# Patient Record
Sex: Female | Born: 1945
Health system: Southern US, Community
[De-identification: ages and names within clinical notes are randomized; demographics above are authoritative.]

## PROBLEM LIST (undated history)

## (undated) DIAGNOSIS — I10 Essential (primary) hypertension: Secondary | ICD-10-CM

## (undated) DIAGNOSIS — E669 Obesity, unspecified: Secondary | ICD-10-CM

## (undated) DIAGNOSIS — F32A Depression, unspecified: Secondary | ICD-10-CM

## (undated) DIAGNOSIS — H353190 Nonexudative age-related macular degeneration, unspecified eye, stage unspecified: Secondary | ICD-10-CM

## (undated) DIAGNOSIS — R296 Repeated falls: Secondary | ICD-10-CM

## (undated) DIAGNOSIS — F329 Major depressive disorder, single episode, unspecified: Secondary | ICD-10-CM

## (undated) DIAGNOSIS — F419 Anxiety disorder, unspecified: Secondary | ICD-10-CM

## (undated) DIAGNOSIS — C439 Malignant melanoma of skin, unspecified: Secondary | ICD-10-CM

## (undated) DIAGNOSIS — M1990 Unspecified osteoarthritis, unspecified site: Secondary | ICD-10-CM

## (undated) DIAGNOSIS — C801 Malignant (primary) neoplasm, unspecified: Secondary | ICD-10-CM

## (undated) DIAGNOSIS — N289 Disorder of kidney and ureter, unspecified: Secondary | ICD-10-CM

## (undated) DIAGNOSIS — G252 Other specified forms of tremor: Secondary | ICD-10-CM

## (undated) DIAGNOSIS — G20A1 Parkinson's disease without dyskinesia, without mention of fluctuations: Secondary | ICD-10-CM

## (undated) DIAGNOSIS — G20 Parkinson's disease: Secondary | ICD-10-CM

## (undated) DIAGNOSIS — R259 Unspecified abnormal involuntary movements: Secondary | ICD-10-CM

## (undated) HISTORY — DX: Unspecified abnormal involuntary movements: R25.9

## (undated) HISTORY — DX: Repeated falls: R29.6

## (undated) HISTORY — DX: Obesity, unspecified: E66.9

## (undated) HISTORY — DX: Anxiety disorder, unspecified: F41.9

## (undated) HISTORY — PX: ABDOMINAL HYSTERECTOMY: SUR658

## (undated) HISTORY — DX: Major depressive disorder, single episode, unspecified: F32.9

## (undated) HISTORY — DX: Malignant melanoma of skin, unspecified: C43.9

## (undated) HISTORY — DX: Nonexudative age-related macular degeneration, unspecified eye, stage unspecified: H35.3190

## (undated) HISTORY — DX: Disorder of kidney and ureter, unspecified: N28.9

## (undated) HISTORY — DX: Unspecified osteoarthritis, unspecified site: M19.90

## (undated) HISTORY — PX: CHOLECYSTECTOMY: SHX55

## (undated) HISTORY — DX: Malignant (primary) neoplasm, unspecified: C80.1

## (undated) HISTORY — DX: Depression, unspecified: F32.A

## (undated) HISTORY — DX: Essential (primary) hypertension: I10

## (undated) HISTORY — DX: Other specified forms of tremor: G25.2

---

## 2014-03-24 DIAGNOSIS — R413 Other amnesia: Secondary | ICD-10-CM | POA: Insufficient documentation

## 2016-10-18 ENCOUNTER — Ambulatory Visit (INDEPENDENT_AMBULATORY_CARE_PROVIDER_SITE_OTHER): Payer: Medicare PPO

## 2016-10-18 ENCOUNTER — Encounter (INDEPENDENT_AMBULATORY_CARE_PROVIDER_SITE_OTHER): Payer: Self-pay | Admitting: Orthopaedic Surgery

## 2016-10-18 ENCOUNTER — Ambulatory Visit (INDEPENDENT_AMBULATORY_CARE_PROVIDER_SITE_OTHER): Payer: Medicare PPO | Admitting: Orthopaedic Surgery

## 2016-10-18 VITALS — BP 104/65 | HR 72 | Ht 62.0 in | Wt 190.0 lb

## 2016-10-18 DIAGNOSIS — M79631 Pain in right forearm: Secondary | ICD-10-CM

## 2016-10-18 DIAGNOSIS — S52124A Nondisplaced fracture of head of right radius, initial encounter for closed fracture: Secondary | ICD-10-CM | POA: Insufficient documentation

## 2016-10-18 NOTE — Progress Notes (Signed)
Office Visit Note   Patient: Tonya Perez           Date of Birth: 01-Jul-1946           MRN: [id number] Visit Date: 10/18/2016              Requested by: No referring provider defined for this encounter. PCP: No primary care provider on file.   Assessment & Plan: Visit Diagnoses:  1. Right forearm pain   2. Closed nondisplaced fracture of head of right radius, initial encounter     Plan: We'll place a removable posterior splint and sling. She can remove for bathing work on gentle range of motion the reapplication. We'll recheck her in 3-4 weeks repeat x-rays of the radial head AP lateral elbow and radial head view on return. Norco prescribed for pain. She lived in [place] and this now moved to [place].  Follow-Up Instructions: Use sling you can remove it for bathing. Follow-up for x-rays in 3-4 weeks  Orders:  Orders Placed This Encounter  Procedures  . XR Forearm Right   No orders of the defined types were placed in this encounter.     Procedures: No procedures performed   Clinical Data: No additional findings.   Subjective: Chief Complaint  Patient presents with  . Right Wrist - Pain    Patient fell down four concrete steps last night (10/17/16).  She is having pain in her right wrist but also states that she has pain when rotating arm.  She has used ice and tylenol.  Patient also did her face has bilateral periorbital ecchymosis. No pain in her face normal smiling grimace. She denies cognitive changes no loss of consciousness. She is here with family members who verifies that the story of her injury and no loss of consciousness.  Review of Systems  Constitutional: Negative for chills and diaphoresis.  HENT: Negative for ear discharge, ear pain and nosebleeds.   Eyes: Negative for discharge and visual disturbance.  Respiratory: Negative for cough, choking and shortness of breath.   Cardiovascular: Negative for chest pain and palpitations.   Positive for hypertension.  Gastrointestinal: Negative for abdominal distention and abdominal pain.       Previous gallbladder surgery  Endocrine: Negative for cold intolerance and heat intolerance.  Genitourinary: Negative for flank pain and hematuria.       There is hysterectomy history of cancer. Previous history verbally of kidney disease.  Skin: Negative for rash and wound.  Neurological: Negative for seizures and speech difficulty.  Hematological: Negative for adenopathy. Does not bruise/bleed easily.  Psychiatric/Behavioral: Negative for agitation and suicidal ideas.       Positive for anxiety and depression.     Objective: Vital Signs: BP 104/65   Pulse 72   Ht 5\' 2"  (1.575 m)   Wt 190 lb (86.2 kg)   BMI 34.75 kg/m   Physical Exam  Constitutional: She is oriented to person, place, and time. She appears well-developed.  HENT:  Head: Normocephalic.  Right Ear: External ear normal.  Left Ear: External ear normal.  Eyes: Pupils are equal, round, and reactive to light.  Good visual acuity both eyes.. Orbital mild ecchymosis. Abrasion over her forehead. No palpable step off orbital rim is nontender no nasal deformity. Normal grimace and smile.  Neck: No tracheal deviation present. No thyromegaly present.  Cardiovascular: Normal rate.   Pulmonary/Chest: Effort normal.  Abdominal: Soft.  Musculoskeletal:  She has pain with forearm rotation at the radial head acute  tenderness with palpation of the radial head. Trace elbow effusion noted. The range of motion is 20-90 with pain at the end of the art. No distal radius tenderness sensation and is normal.  Neurological: She is alert and oriented to person, place, and time. No cranial nerve deficit. Coordination normal.  Skin: Skin is warm and dry.  Psychiatric: She has a normal mood and affect. Her behavior is normal.    Ortho Exam  Specialty Comments:  No specialty comments available.  Imaging: No results found.   PMFS  History: Patient Active Problem List   Diagnosis Date Noted  . Closed nondisplaced fracture of head of right radius 10/18/2016   Past Medical History:  Diagnosis Date  . Anxiety   . Cancer ([place])   . Depression   . High blood pressure   . Kidney disease     No family history on file.  Past Surgical History:  Procedure Laterality Date  . ABDOMINAL HYSTERECTOMY    . CHOLECYSTECTOMY     Social History   Occupational History  . Not on file.   Social History Main Topics  . Smoking status: Never Smoker  . Smokeless tobacco: Never Used  . Alcohol use No  . Drug use: No  . Sexual activity: Not on file

## 2016-11-20 DIAGNOSIS — F411 Generalized anxiety disorder: Secondary | ICD-10-CM | POA: Diagnosis not present

## 2016-11-20 DIAGNOSIS — F341 Dysthymic disorder: Secondary | ICD-10-CM | POA: Diagnosis not present

## 2017-03-14 DIAGNOSIS — N183 Chronic kidney disease, stage 3 (moderate): Secondary | ICD-10-CM | POA: Diagnosis not present

## 2017-03-14 DIAGNOSIS — N189 Chronic kidney disease, unspecified: Secondary | ICD-10-CM | POA: Diagnosis not present

## 2017-03-14 DIAGNOSIS — I10 Essential (primary) hypertension: Secondary | ICD-10-CM | POA: Diagnosis not present

## 2017-04-03 DIAGNOSIS — M25562 Pain in left knee: Secondary | ICD-10-CM | POA: Diagnosis not present

## 2017-04-03 DIAGNOSIS — M170 Bilateral primary osteoarthritis of knee: Secondary | ICD-10-CM | POA: Diagnosis not present

## 2017-04-08 DIAGNOSIS — D374 Neoplasm of uncertain behavior of colon: Secondary | ICD-10-CM | POA: Diagnosis not present

## 2017-04-08 DIAGNOSIS — D125 Benign neoplasm of sigmoid colon: Secondary | ICD-10-CM | POA: Diagnosis not present

## 2017-04-08 DIAGNOSIS — K648 Other hemorrhoids: Secondary | ICD-10-CM | POA: Diagnosis not present

## 2017-04-08 DIAGNOSIS — Z1211 Encounter for screening for malignant neoplasm of colon: Secondary | ICD-10-CM | POA: Diagnosis not present

## 2017-04-08 DIAGNOSIS — K649 Unspecified hemorrhoids: Secondary | ICD-10-CM | POA: Diagnosis not present

## 2017-04-08 DIAGNOSIS — K635 Polyp of colon: Secondary | ICD-10-CM | POA: Diagnosis not present

## 2017-04-08 DIAGNOSIS — K5730 Diverticulosis of large intestine without perforation or abscess without bleeding: Secondary | ICD-10-CM | POA: Diagnosis not present

## 2017-04-10 DIAGNOSIS — H353133 Nonexudative age-related macular degeneration, bilateral, advanced atrophic without subfoveal involvement: Secondary | ICD-10-CM | POA: Diagnosis not present

## 2017-04-15 DIAGNOSIS — K589 Irritable bowel syndrome without diarrhea: Secondary | ICD-10-CM | POA: Diagnosis not present

## 2017-04-15 DIAGNOSIS — N182 Chronic kidney disease, stage 2 (mild): Secondary | ICD-10-CM | POA: Diagnosis not present

## 2017-04-15 DIAGNOSIS — I10 Essential (primary) hypertension: Secondary | ICD-10-CM | POA: Diagnosis not present

## 2017-04-19 DIAGNOSIS — K635 Polyp of colon: Secondary | ICD-10-CM | POA: Diagnosis not present

## 2017-05-22 DIAGNOSIS — I129 Hypertensive chronic kidney disease with stage 1 through stage 4 chronic kidney disease, or unspecified chronic kidney disease: Secondary | ICD-10-CM | POA: Diagnosis not present

## 2017-05-22 DIAGNOSIS — N182 Chronic kidney disease, stage 2 (mild): Secondary | ICD-10-CM | POA: Diagnosis not present

## 2017-05-23 DIAGNOSIS — M25562 Pain in left knee: Secondary | ICD-10-CM | POA: Diagnosis not present

## 2017-05-23 DIAGNOSIS — M170 Bilateral primary osteoarthritis of knee: Secondary | ICD-10-CM | POA: Diagnosis not present

## 2017-06-19 DIAGNOSIS — F411 Generalized anxiety disorder: Secondary | ICD-10-CM | POA: Diagnosis not present

## 2017-06-19 DIAGNOSIS — F341 Dysthymic disorder: Secondary | ICD-10-CM | POA: Diagnosis not present

## 2017-10-07 DIAGNOSIS — Z23 Encounter for immunization: Secondary | ICD-10-CM | POA: Diagnosis not present

## 2017-11-15 DIAGNOSIS — K589 Irritable bowel syndrome without diarrhea: Secondary | ICD-10-CM | POA: Diagnosis not present

## 2017-11-15 DIAGNOSIS — I10 Essential (primary) hypertension: Secondary | ICD-10-CM | POA: Diagnosis not present

## 2017-11-15 DIAGNOSIS — E7800 Pure hypercholesterolemia, unspecified: Secondary | ICD-10-CM | POA: Diagnosis not present

## 2017-12-06 DIAGNOSIS — M25561 Pain in right knee: Secondary | ICD-10-CM | POA: Diagnosis not present

## 2017-12-06 DIAGNOSIS — M170 Bilateral primary osteoarthritis of knee: Secondary | ICD-10-CM | POA: Diagnosis not present

## 2017-12-06 DIAGNOSIS — M25562 Pain in left knee: Secondary | ICD-10-CM | POA: Diagnosis not present

## 2017-12-11 DIAGNOSIS — F341 Dysthymic disorder: Secondary | ICD-10-CM | POA: Diagnosis not present

## 2017-12-11 DIAGNOSIS — F411 Generalized anxiety disorder: Secondary | ICD-10-CM | POA: Diagnosis not present

## 2017-12-12 DIAGNOSIS — H3540 Unspecified peripheral retinal degeneration: Secondary | ICD-10-CM | POA: Diagnosis not present

## 2017-12-12 DIAGNOSIS — H353132 Nonexudative age-related macular degeneration, bilateral, intermediate dry stage: Secondary | ICD-10-CM | POA: Diagnosis not present

## 2017-12-16 DIAGNOSIS — I10 Essential (primary) hypertension: Secondary | ICD-10-CM | POA: Diagnosis not present

## 2017-12-16 DIAGNOSIS — Z1389 Encounter for screening for other disorder: Secondary | ICD-10-CM | POA: Diagnosis not present

## 2017-12-16 DIAGNOSIS — Z5320 Procedure and treatment not carried out because of patient's decision for unspecified reasons: Secondary | ICD-10-CM | POA: Diagnosis not present

## 2017-12-16 DIAGNOSIS — Z0000 Encounter for general adult medical examination without abnormal findings: Secondary | ICD-10-CM | POA: Diagnosis not present

## 2017-12-16 DIAGNOSIS — N183 Chronic kidney disease, stage 3 (moderate): Secondary | ICD-10-CM | POA: Diagnosis not present

## 2018-01-09 DIAGNOSIS — H353132 Nonexudative age-related macular degeneration, bilateral, intermediate dry stage: Secondary | ICD-10-CM | POA: Diagnosis not present

## 2018-01-09 DIAGNOSIS — H2513 Age-related nuclear cataract, bilateral: Secondary | ICD-10-CM | POA: Diagnosis not present

## 2018-01-23 DIAGNOSIS — M170 Bilateral primary osteoarthritis of knee: Secondary | ICD-10-CM | POA: Diagnosis not present

## 2018-01-27 DIAGNOSIS — H353132 Nonexudative age-related macular degeneration, bilateral, intermediate dry stage: Secondary | ICD-10-CM | POA: Diagnosis not present

## 2018-01-27 DIAGNOSIS — H43813 Vitreous degeneration, bilateral: Secondary | ICD-10-CM | POA: Diagnosis not present

## 2018-01-29 DIAGNOSIS — M170 Bilateral primary osteoarthritis of knee: Secondary | ICD-10-CM | POA: Diagnosis not present

## 2018-01-31 DIAGNOSIS — R8279 Other abnormal findings on microbiological examination of urine: Secondary | ICD-10-CM | POA: Diagnosis not present

## 2018-01-31 DIAGNOSIS — N3946 Mixed incontinence: Secondary | ICD-10-CM | POA: Diagnosis not present

## 2018-02-05 DIAGNOSIS — M170 Bilateral primary osteoarthritis of knee: Secondary | ICD-10-CM | POA: Diagnosis not present

## 2018-02-11 DIAGNOSIS — R0989 Other specified symptoms and signs involving the circulatory and respiratory systems: Secondary | ICD-10-CM | POA: Diagnosis not present

## 2018-02-11 DIAGNOSIS — N390 Urinary tract infection, site not specified: Secondary | ICD-10-CM | POA: Diagnosis not present

## 2018-02-11 DIAGNOSIS — J209 Acute bronchitis, unspecified: Secondary | ICD-10-CM | POA: Diagnosis not present

## 2018-02-11 DIAGNOSIS — J00 Acute nasopharyngitis [common cold]: Secondary | ICD-10-CM | POA: Diagnosis not present

## 2018-02-11 DIAGNOSIS — N393 Stress incontinence (female) (male): Secondary | ICD-10-CM | POA: Diagnosis not present

## 2018-02-11 DIAGNOSIS — J40 Bronchitis, not specified as acute or chronic: Secondary | ICD-10-CM | POA: Diagnosis not present

## 2018-02-11 DIAGNOSIS — I10 Essential (primary) hypertension: Secondary | ICD-10-CM | POA: Diagnosis not present

## 2018-02-11 DIAGNOSIS — R05 Cough: Secondary | ICD-10-CM | POA: Diagnosis not present

## 2018-03-19 DIAGNOSIS — M25561 Pain in right knee: Secondary | ICD-10-CM | POA: Diagnosis not present

## 2018-03-19 DIAGNOSIS — M170 Bilateral primary osteoarthritis of knee: Secondary | ICD-10-CM | POA: Diagnosis not present

## 2018-03-19 DIAGNOSIS — M25562 Pain in left knee: Secondary | ICD-10-CM | POA: Diagnosis not present

## 2018-03-26 DIAGNOSIS — M25561 Pain in right knee: Secondary | ICD-10-CM | POA: Insufficient documentation

## 2018-03-26 DIAGNOSIS — M6281 Muscle weakness (generalized): Secondary | ICD-10-CM | POA: Diagnosis not present

## 2018-03-26 DIAGNOSIS — M25562 Pain in left knee: Secondary | ICD-10-CM | POA: Diagnosis not present

## 2018-03-26 DIAGNOSIS — R2689 Other abnormalities of gait and mobility: Secondary | ICD-10-CM | POA: Insufficient documentation

## 2018-03-26 DIAGNOSIS — R262 Difficulty in walking, not elsewhere classified: Secondary | ICD-10-CM | POA: Diagnosis not present

## 2018-03-27 DIAGNOSIS — M25562 Pain in left knee: Secondary | ICD-10-CM | POA: Diagnosis not present

## 2018-03-27 DIAGNOSIS — R2689 Other abnormalities of gait and mobility: Secondary | ICD-10-CM | POA: Diagnosis not present

## 2018-03-27 DIAGNOSIS — R262 Difficulty in walking, not elsewhere classified: Secondary | ICD-10-CM | POA: Diagnosis not present

## 2018-03-27 DIAGNOSIS — M6281 Muscle weakness (generalized): Secondary | ICD-10-CM | POA: Diagnosis not present

## 2018-03-27 DIAGNOSIS — M25561 Pain in right knee: Secondary | ICD-10-CM | POA: Diagnosis not present

## 2018-04-02 DIAGNOSIS — M25562 Pain in left knee: Secondary | ICD-10-CM | POA: Diagnosis not present

## 2018-04-02 DIAGNOSIS — R262 Difficulty in walking, not elsewhere classified: Secondary | ICD-10-CM | POA: Diagnosis not present

## 2018-04-02 DIAGNOSIS — R2689 Other abnormalities of gait and mobility: Secondary | ICD-10-CM | POA: Diagnosis not present

## 2018-04-02 DIAGNOSIS — M6281 Muscle weakness (generalized): Secondary | ICD-10-CM | POA: Diagnosis not present

## 2018-04-02 DIAGNOSIS — M25561 Pain in right knee: Secondary | ICD-10-CM | POA: Diagnosis not present

## 2018-04-03 DIAGNOSIS — N3946 Mixed incontinence: Secondary | ICD-10-CM | POA: Diagnosis not present

## 2018-04-03 DIAGNOSIS — R32 Unspecified urinary incontinence: Secondary | ICD-10-CM | POA: Diagnosis not present

## 2018-04-04 DIAGNOSIS — R262 Difficulty in walking, not elsewhere classified: Secondary | ICD-10-CM | POA: Diagnosis not present

## 2018-04-04 DIAGNOSIS — M6281 Muscle weakness (generalized): Secondary | ICD-10-CM | POA: Diagnosis not present

## 2018-04-04 DIAGNOSIS — M25562 Pain in left knee: Secondary | ICD-10-CM | POA: Diagnosis not present

## 2018-04-04 DIAGNOSIS — R2689 Other abnormalities of gait and mobility: Secondary | ICD-10-CM | POA: Diagnosis not present

## 2018-04-04 DIAGNOSIS — M25561 Pain in right knee: Secondary | ICD-10-CM | POA: Diagnosis not present

## 2018-04-09 DIAGNOSIS — M25562 Pain in left knee: Secondary | ICD-10-CM | POA: Diagnosis not present

## 2018-04-09 DIAGNOSIS — R262 Difficulty in walking, not elsewhere classified: Secondary | ICD-10-CM | POA: Diagnosis not present

## 2018-04-09 DIAGNOSIS — R2689 Other abnormalities of gait and mobility: Secondary | ICD-10-CM | POA: Diagnosis not present

## 2018-04-09 DIAGNOSIS — M6281 Muscle weakness (generalized): Secondary | ICD-10-CM | POA: Diagnosis not present

## 2018-04-09 DIAGNOSIS — M25561 Pain in right knee: Secondary | ICD-10-CM | POA: Diagnosis not present

## 2018-04-16 DIAGNOSIS — R262 Difficulty in walking, not elsewhere classified: Secondary | ICD-10-CM | POA: Diagnosis not present

## 2018-04-16 DIAGNOSIS — M25561 Pain in right knee: Secondary | ICD-10-CM | POA: Diagnosis not present

## 2018-04-16 DIAGNOSIS — M25562 Pain in left knee: Secondary | ICD-10-CM | POA: Diagnosis not present

## 2018-04-16 DIAGNOSIS — M6281 Muscle weakness (generalized): Secondary | ICD-10-CM | POA: Diagnosis not present

## 2018-04-16 DIAGNOSIS — R2689 Other abnormalities of gait and mobility: Secondary | ICD-10-CM | POA: Diagnosis not present

## 2018-05-07 DIAGNOSIS — M6281 Muscle weakness (generalized): Secondary | ICD-10-CM | POA: Diagnosis not present

## 2018-05-07 DIAGNOSIS — M25561 Pain in right knee: Secondary | ICD-10-CM | POA: Diagnosis not present

## 2018-05-07 DIAGNOSIS — R262 Difficulty in walking, not elsewhere classified: Secondary | ICD-10-CM | POA: Diagnosis not present

## 2018-05-07 DIAGNOSIS — R2689 Other abnormalities of gait and mobility: Secondary | ICD-10-CM | POA: Diagnosis not present

## 2018-05-07 DIAGNOSIS — M25562 Pain in left knee: Secondary | ICD-10-CM | POA: Diagnosis not present

## 2018-05-21 DIAGNOSIS — R2689 Other abnormalities of gait and mobility: Secondary | ICD-10-CM | POA: Diagnosis not present

## 2018-05-21 DIAGNOSIS — M25561 Pain in right knee: Secondary | ICD-10-CM | POA: Diagnosis not present

## 2018-05-21 DIAGNOSIS — M6281 Muscle weakness (generalized): Secondary | ICD-10-CM | POA: Diagnosis not present

## 2018-05-21 DIAGNOSIS — M25562 Pain in left knee: Secondary | ICD-10-CM | POA: Diagnosis not present

## 2018-05-21 DIAGNOSIS — R262 Difficulty in walking, not elsewhere classified: Secondary | ICD-10-CM | POA: Diagnosis not present

## 2018-05-29 DIAGNOSIS — M2392 Unspecified internal derangement of left knee: Secondary | ICD-10-CM | POA: Diagnosis not present

## 2018-06-11 DIAGNOSIS — F419 Anxiety disorder, unspecified: Secondary | ICD-10-CM | POA: Diagnosis not present

## 2018-06-11 DIAGNOSIS — I10 Essential (primary) hypertension: Secondary | ICD-10-CM | POA: Diagnosis not present

## 2018-06-11 DIAGNOSIS — N390 Urinary tract infection, site not specified: Secondary | ICD-10-CM | POA: Diagnosis not present

## 2018-06-11 DIAGNOSIS — Z888 Allergy status to other drugs, medicaments and biological substances status: Secondary | ICD-10-CM | POA: Diagnosis not present

## 2018-06-11 DIAGNOSIS — Z79899 Other long term (current) drug therapy: Secondary | ICD-10-CM | POA: Diagnosis not present

## 2018-06-11 DIAGNOSIS — S0990XA Unspecified injury of head, initial encounter: Secondary | ICD-10-CM | POA: Diagnosis not present

## 2018-06-11 DIAGNOSIS — Z882 Allergy status to sulfonamides status: Secondary | ICD-10-CM | POA: Diagnosis not present

## 2018-06-11 DIAGNOSIS — R930 Abnormal findings on diagnostic imaging of skull and head, not elsewhere classified: Secondary | ICD-10-CM | POA: Diagnosis not present

## 2018-06-11 DIAGNOSIS — F411 Generalized anxiety disorder: Secondary | ICD-10-CM | POA: Diagnosis not present

## 2018-06-11 DIAGNOSIS — F341 Dysthymic disorder: Secondary | ICD-10-CM | POA: Diagnosis not present

## 2018-06-11 DIAGNOSIS — S060X1A Concussion with loss of consciousness of 30 minutes or less, initial encounter: Secondary | ICD-10-CM | POA: Diagnosis not present

## 2018-06-12 DIAGNOSIS — S83242A Other tear of medial meniscus, current injury, left knee, initial encounter: Secondary | ICD-10-CM | POA: Diagnosis not present

## 2018-06-16 DIAGNOSIS — R262 Difficulty in walking, not elsewhere classified: Secondary | ICD-10-CM | POA: Diagnosis not present

## 2018-06-16 DIAGNOSIS — M170 Bilateral primary osteoarthritis of knee: Secondary | ICD-10-CM | POA: Diagnosis not present

## 2018-06-16 DIAGNOSIS — M2392 Unspecified internal derangement of left knee: Secondary | ICD-10-CM | POA: Diagnosis not present

## 2018-07-06 DIAGNOSIS — N189 Chronic kidney disease, unspecified: Secondary | ICD-10-CM | POA: Diagnosis not present

## 2018-07-06 DIAGNOSIS — I129 Hypertensive chronic kidney disease with stage 1 through stage 4 chronic kidney disease, or unspecified chronic kidney disease: Secondary | ICD-10-CM | POA: Diagnosis not present

## 2018-07-10 DIAGNOSIS — H2513 Age-related nuclear cataract, bilateral: Secondary | ICD-10-CM | POA: Diagnosis not present

## 2018-07-10 DIAGNOSIS — H353132 Nonexudative age-related macular degeneration, bilateral, intermediate dry stage: Secondary | ICD-10-CM | POA: Diagnosis not present

## 2018-07-14 DIAGNOSIS — I129 Hypertensive chronic kidney disease with stage 1 through stage 4 chronic kidney disease, or unspecified chronic kidney disease: Secondary | ICD-10-CM | POA: Diagnosis not present

## 2018-07-14 DIAGNOSIS — N182 Chronic kidney disease, stage 2 (mild): Secondary | ICD-10-CM | POA: Diagnosis not present

## 2018-07-14 DIAGNOSIS — E669 Obesity, unspecified: Secondary | ICD-10-CM | POA: Diagnosis not present

## 2018-07-24 DIAGNOSIS — Z23 Encounter for immunization: Secondary | ICD-10-CM | POA: Diagnosis not present

## 2018-09-05 DIAGNOSIS — M170 Bilateral primary osteoarthritis of knee: Secondary | ICD-10-CM | POA: Diagnosis not present

## 2018-09-10 DIAGNOSIS — F411 Generalized anxiety disorder: Secondary | ICD-10-CM | POA: Diagnosis not present

## 2018-09-10 DIAGNOSIS — F341 Dysthymic disorder: Secondary | ICD-10-CM | POA: Diagnosis not present

## 2018-12-23 DIAGNOSIS — E6609 Other obesity due to excess calories: Secondary | ICD-10-CM | POA: Diagnosis not present

## 2018-12-23 DIAGNOSIS — I10 Essential (primary) hypertension: Secondary | ICD-10-CM | POA: Diagnosis not present

## 2018-12-23 DIAGNOSIS — R251 Tremor, unspecified: Secondary | ICD-10-CM | POA: Diagnosis not present

## 2018-12-23 DIAGNOSIS — R296 Repeated falls: Secondary | ICD-10-CM | POA: Diagnosis not present

## 2018-12-23 DIAGNOSIS — F332 Major depressive disorder, recurrent severe without psychotic features: Secondary | ICD-10-CM | POA: Diagnosis not present

## 2018-12-23 DIAGNOSIS — H3530 Unspecified macular degeneration: Secondary | ICD-10-CM | POA: Diagnosis not present

## 2018-12-23 DIAGNOSIS — M170 Bilateral primary osteoarthritis of knee: Secondary | ICD-10-CM | POA: Diagnosis not present

## 2019-02-10 DIAGNOSIS — I10 Essential (primary) hypertension: Secondary | ICD-10-CM | POA: Diagnosis not present

## 2019-02-13 DIAGNOSIS — E6609 Other obesity due to excess calories: Secondary | ICD-10-CM | POA: Diagnosis not present

## 2019-02-13 DIAGNOSIS — I129 Hypertensive chronic kidney disease with stage 1 through stage 4 chronic kidney disease, or unspecified chronic kidney disease: Secondary | ICD-10-CM | POA: Diagnosis not present

## 2019-02-13 DIAGNOSIS — Z0001 Encounter for general adult medical examination with abnormal findings: Secondary | ICD-10-CM | POA: Diagnosis not present

## 2019-02-13 DIAGNOSIS — M170 Bilateral primary osteoarthritis of knee: Secondary | ICD-10-CM | POA: Diagnosis not present

## 2019-02-13 DIAGNOSIS — H3530 Unspecified macular degeneration: Secondary | ICD-10-CM | POA: Diagnosis not present

## 2019-02-13 DIAGNOSIS — F332 Major depressive disorder, recurrent severe without psychotic features: Secondary | ICD-10-CM | POA: Diagnosis not present

## 2019-02-13 DIAGNOSIS — R296 Repeated falls: Secondary | ICD-10-CM | POA: Diagnosis not present

## 2019-02-13 DIAGNOSIS — N183 Chronic kidney disease, stage 3 (moderate): Secondary | ICD-10-CM | POA: Diagnosis not present

## 2019-02-13 DIAGNOSIS — R251 Tremor, unspecified: Secondary | ICD-10-CM | POA: Diagnosis not present

## 2019-02-16 DIAGNOSIS — M25569 Pain in unspecified knee: Secondary | ICD-10-CM | POA: Diagnosis not present

## 2019-02-16 DIAGNOSIS — M179 Osteoarthritis of knee, unspecified: Secondary | ICD-10-CM | POA: Diagnosis not present

## 2019-02-16 DIAGNOSIS — R42 Dizziness and giddiness: Secondary | ICD-10-CM | POA: Diagnosis not present

## 2019-02-16 DIAGNOSIS — Z9181 History of falling: Secondary | ICD-10-CM | POA: Diagnosis not present

## 2019-02-16 DIAGNOSIS — G3184 Mild cognitive impairment, so stated: Secondary | ICD-10-CM | POA: Diagnosis not present

## 2019-02-16 DIAGNOSIS — H353130 Nonexudative age-related macular degeneration, bilateral, stage unspecified: Secondary | ICD-10-CM | POA: Diagnosis not present

## 2019-02-16 DIAGNOSIS — R251 Tremor, unspecified: Secondary | ICD-10-CM | POA: Diagnosis not present

## 2019-02-18 ENCOUNTER — Encounter: Payer: Self-pay | Admitting: *Deleted

## 2019-02-19 ENCOUNTER — Other Ambulatory Visit: Payer: Self-pay

## 2019-02-19 ENCOUNTER — Telehealth: Payer: Self-pay | Admitting: Neurology

## 2019-02-19 ENCOUNTER — Ambulatory Visit (INDEPENDENT_AMBULATORY_CARE_PROVIDER_SITE_OTHER): Payer: Medicare Other | Admitting: Neurology

## 2019-02-19 ENCOUNTER — Encounter: Payer: Self-pay | Admitting: Neurology

## 2019-02-19 DIAGNOSIS — R251 Tremor, unspecified: Secondary | ICD-10-CM | POA: Diagnosis not present

## 2019-02-19 NOTE — Progress Notes (Addendum)
PATIENT: Tonya Perez DOB: 1945-12-04  Virtual Visit via video  I connected with Tonya Perez on 02/19/19 at  by video and verified that I am speaking with the correct person using two identifiers.   I discussed the limitations, risks, security and privacy concerns of performing an evaluation and management service by video and the availability of in person appointments. I also discussed with the patient that there may be a patient responsible charge related to this service. The patient expressed understanding and agreed to proceed.  HISTORICAL   Follow Up Instructions:   34-month   I discussed the assessment and treatment plan with the patient. The patient was provided an opportunity to ask questions and all were answered. The patient agreed with the plan and demonstrated an understanding of the instructions.   The patient was advised to call back or seek an in-person evaluation if the symptoms worsen or if the condition fails to improve as anticipated.  I provided 30 minutes of non-face-to-face time during this encounter.  REVIEW OF SYSTEMS: Full 14 system review of systems performed and notable only for as above. All other review of systems were negative.  ALLERGIES: Allergies  Allergen Reactions  . Sulfa Antibiotics Swelling and Rash    HOME MEDICATIONS: Current Outpatient Medications  Medication Sig Dispense Refill  . ALPRAZolam (XANAX) 0.5 MG tablet Take 0.5 mg by mouth at bedtime.     Marland Kitchen buPROPion (WELLBUTRIN SR) 200 MG 12 hr tablet Take 400 mg by mouth daily.     . Cholecalciferol (VITAMIN D3 PO) Take 1,000 Units by mouth daily.    Marland Kitchen imipramine (TOFRANIL) 25 MG tablet Take 25 mg by mouth daily.     Marland Kitchen lisinopril (PRINIVIL,ZESTRIL) 20 MG tablet Take 20 mg by mouth daily.      No current facility-administered medications for this visit.     PAST MEDICAL HISTORY: Past Medical History:  Diagnosis Date  . Anxiety   . Depression   . Frequent falls   . High blood pressure    . Kidney disease   . Macular degeneration, dry   . Melanoma ([place])   . Obesity   . Osteoarthritis   . Resting tremor    left hand    PAST SURGICAL HISTORY: Past Surgical History:  Procedure Laterality Date  . ABDOMINAL HYSTERECTOMY    . CHOLECYSTECTOMY      FAMILY HISTORY: Family History  Problem Relation Age of Onset  . Other Mother        ruptured gall bladder  . Heart attack Father     SOCIAL HISTORY:   Social History   Socioeconomic History  . Marital status: Married    Spouse name: Not on file  . Number of children: 2  . Years of education: 54  . Highest education level: High school graduate  Occupational History  . Occupation: Retired  Scientific laboratory technician  . Financial resource strain: Not on file  . Food insecurity:    Worry: Not on file    Inability: Not on file  . Transportation needs:    Medical: Not on file    Non-medical: Not on file  Tobacco Use  . Smoking status: Never Smoker  . Smokeless tobacco: Never Used  Substance and Sexual Activity  . Alcohol use: No  . Drug use: No  . Sexual activity: Not on file  Lifestyle  . Physical activity:    Days per week: Not on file    Minutes per session: Not on file  .  Stress: Not on file  Relationships  . Social connections:    Talks on phone: Not on file    Gets together: Not on file    Attends religious service: Not on file    Active member of club or organization: Not on file    Attends meetings of clubs or organizations: Not on file    Relationship status: Not on file  . Intimate partner violence:    Fear of current or ex partner: Not on file    Emotionally abused: Not on file    Physically abused: Not on file    Forced sexual activity: Not on file  Other Topics Concern  . Not on file  Social History Narrative   Lives at home with her husband.   Left-handed.   No caffeine use.    Marcial Pacas, M.D. Ph.D.  [facility] [address], [place], [place] [address] Ph:  [phone] Fax: [phone]  CC: Referring Provider   PATIENT: Tonya Perez DOB: 08/06/1946  Virtual Visit via video  I connected with Tonya Perez on 02/19/19 at  by video and verified that I am speaking with the correct person using two identifiers.   I discussed the limitations, risks, security and privacy concerns of performing an evaluation and management service by video and the availability of in person appointments. I also discussed with the patient that there may be a patient responsible charge related to this service. The patient expressed understanding and agreed to proceed.  HISTORICAL  Tonya Perez, seen in request by husband daguther in [place]  I have reviewed and summarized the referring note from the referring physician. HTN, dperssion,  wellburtinx xr 200mg  2 qhs, xanax fro sleeps,  Tremor, left elbow to her finger, since 2019, gettign wrose 6 mnths, tremor present  Only the left arm, nto in right arm, trouble walking knee pain  No family of tremor, left handed, when her left hand tremor getting bad,      Lab evaluation, has August 2019, recent lab by her  Observations/Objective: I have reviewed problem lists, medications, allergies.  Assessment and Plan:   Follow Up Instructions:     I discussed the assessment and treatment plan with the patient. The patient was provided an opportunity to ask questions and all were answered. The patient agreed with the plan and demonstrated an understanding of the instructions.   The patient was advised to call back or seek an in-person evaluation if the symptoms worsen or if the condition fails to improve as anticipated.  I provided 30 minutes of non-face-to-face time during this encounter.  REVIEW OF SYSTEMS: Full 14 system review of systems performed and notable only for as above All other review of systems were negative.  ALLERGIES: Allergies  Allergen Reactions  . Sulfa Antibiotics Swelling and Rash     HOME MEDICATIONS: Current Outpatient Medications  Medication Sig Dispense Refill  . ALPRAZolam (XANAX) 0.5 MG tablet Take 0.5 mg by mouth at bedtime.     Marland Kitchen buPROPion (WELLBUTRIN SR) 200 MG 12 hr tablet Take 400 mg by mouth daily.     . Cholecalciferol (VITAMIN D3 PO) Take 1,000 Units by mouth daily.    Marland Kitchen imipramine (TOFRANIL) 25 MG tablet Take 25 mg by mouth daily.     Marland Kitchen lisinopril (PRINIVIL,ZESTRIL) 20 MG tablet Take 20 mg by mouth daily.      No current facility-administered medications for this visit.     PAST MEDICAL HISTORY: Past Medical History:  Diagnosis Date  . Anxiety   . Depression   . Frequent falls   . High blood pressure   . Kidney disease   . Macular degeneration, dry   . Melanoma ([place])   . Obesity   . Osteoarthritis   . Resting tremor    left hand    PAST SURGICAL HISTORY: Past Surgical History:  Procedure Laterality Date  . ABDOMINAL HYSTERECTOMY    . CHOLECYSTECTOMY      FAMILY HISTORY: Family History  Problem Relation Age of Onset  . Other Mother        ruptured gall bladder  . Heart attack Father     SOCIAL HISTORY:   Social History   Socioeconomic History  . Marital status: Married    Spouse name: Not on file  . Number of children: 2  . Years of education: 110  . Highest education level: High school graduate  Occupational History  . Occupation: Retired  Scientific laboratory technician  . Financial resource strain: Not on file  . Food insecurity:    Worry: Not on file    Inability: Not on file  . Transportation needs:    Medical: Not on file    Non-medical: Not on file  Tobacco Use  . Smoking status: Never Smoker  . Smokeless tobacco: Never Used  Substance and Sexual Activity  . Alcohol use: No  . Drug use: No  . Sexual activity: Not on file  Lifestyle  . Physical activity:    Days per week: Not on file    Minutes per session: Not on file  . Stress: Not on file  Relationships  . Social connections:    Talks on phone: Not on file    Gets  together: Not on file    Attends religious service: Not on file    Active member of club or organization: Not on file    Attends meetings of clubs or organizations: Not on file    Relationship status: Not on file  . Intimate partner violence:    Fear of current or ex partner: Not on file    Emotionally abused: Not on file    Physically abused: Not on file    Forced sexual activity: Not on file  Other Topics Concern  . Not on file  Social History Narrative   Lives at home with her husband.   Left-handed.   No caffeine use.    Marcial Pacas, M.D. Ph.D.  [facility] [address] [place], [place] [address] Ph: [phone] Fax: [phone]  CC: Pablo Lawrence, NP

## 2019-02-19 NOTE — Telephone Encounter (Signed)
Medicare/aarp order sent to [facility]. No auth they will reach out to the pt to schedule.  °

## 2019-02-24 NOTE — Telephone Encounter (Signed)
I spoke to the patient.  She has alprazolam 0.5mg  at home and will take her supply, as prescribed, prior to the MRI.  Reminded her that she will require a driver and she verbalized understanding.

## 2019-02-24 NOTE — Telephone Encounter (Signed)
Pt called in and wanted to know if a sedative can be sent over for her MRI   Tonya Perez, [name]. HARRISON S

## 2019-02-26 ENCOUNTER — Other Ambulatory Visit: Payer: Self-pay

## 2019-02-26 ENCOUNTER — Ambulatory Visit
Admission: RE | Admit: 2019-02-26 | Discharge: 2019-02-26 | Disposition: A | Discharge status: Home / Self Care | Payer: Medicare Other | Source: Ambulatory Visit | Attending: Neurology | Admitting: Neurology

## 2019-02-26 DIAGNOSIS — R251 Tremor, unspecified: Secondary | ICD-10-CM

## 2019-02-27 ENCOUNTER — Encounter: Payer: Self-pay | Admitting: Orthopedic Surgery

## 2019-02-27 ENCOUNTER — Ambulatory Visit (INDEPENDENT_AMBULATORY_CARE_PROVIDER_SITE_OTHER): Payer: Medicare Other

## 2019-02-27 ENCOUNTER — Ambulatory Visit (INDEPENDENT_AMBULATORY_CARE_PROVIDER_SITE_OTHER): Payer: Medicare Other | Admitting: Orthopedic Surgery

## 2019-02-27 VITALS — BP 118/78 | HR 82 | Temp 96.7°F | Ht 62.0 in | Wt 197.0 lb

## 2019-02-27 DIAGNOSIS — M170 Bilateral primary osteoarthritis of knee: Secondary | ICD-10-CM

## 2019-02-27 DIAGNOSIS — M25561 Pain in right knee: Secondary | ICD-10-CM

## 2019-02-27 DIAGNOSIS — G8929 Other chronic pain: Secondary | ICD-10-CM

## 2019-02-27 DIAGNOSIS — M25562 Pain in left knee: Secondary | ICD-10-CM

## 2019-02-27 NOTE — Progress Notes (Signed)
Patient ID: Tonya Perez, female   DOB: 1946-08-16, 73 y.o.   MRN: [id number]  Chief Complaint  Patient presents with  . Knee Pain    73 year old female bilateral knee pain history of bilateral knee injections recently moved to our area.  She complains of bilateral knee pain left worse than right  The patient asked for bilateral knee injections however she has not ever been to the office and required a new evaluation and management prior to deciding if knee injections are warranted  Yesterday she fell onto her right knee sustained an abrasion right knee swelling increased pain described as dull aching pain with decreased range of motion and swelling  Both knees however are chronically painful.   Review of Systems  Musculoskeletal: Positive for falls and joint pain.  Skin: Negative for rash.       Right knee skin abrasion  Neurological: Negative for tingling.    Past Medical History:  Diagnosis Date  . Anxiety   . Depression   . Frequent falls   . High blood pressure   . Kidney disease   . Macular degeneration, dry   . Melanoma ([place])   . Obesity   . Osteoarthritis   . Resting tremor    left hand    Past Surgical History:  Procedure Laterality Date  . ABDOMINAL HYSTERECTOMY    . CHOLECYSTECTOMY      PHYSICAL EXAM  BP 118/78   Pulse 82   Temp (!) 96.7 F (35.9 C)   Ht 5\' 2"  (1.575 m)   Wt 197 lb (89.4 kg)   BMI 36.03 kg/m  GENERAL appearance reveals no gross abnormalities, normal development grooming and hygiene   MENTAL STATUS we note that the patient is awake alert and oriented to person place and time MOOD/AFFECT ARE NORMAL   GAIT reveals  limp affecting the right knee  Right Knee Exam   Muscle Strength  The patient has normal right knee strength.  Tenderness  The patient is experiencing tenderness in the patella and medial joint line.  Range of Motion  Extension: normal  Flexion: abnormal   Tests  Drawer:  Anterior - negative    Posterior -  negative  Other  Erythema: absent Scars: present Sensation: normal Pulse: present Swelling: none Effusion: effusion present  Comments:  Fracture abrasion   Left Knee Exam   Muscle Strength  The patient has normal left knee strength.  Tenderness  The patient is experiencing tenderness in the medial joint line.  Range of Motion  Extension: normal  Flexion: normal   Tests  Drawer:  Anterior - negative     Posterior - negative  Other  Scars: present Sensation: normal Pulse: present Swelling: none Effusion: no effusion present  Comments:  Previous skin graft just below the tibial tubercle     VASC 2+ dorsalis pedis pulse normal capillary refill excellent warmth to the extremity  NEURO normal sensation and no pathologic reflexes  LYMPH deferred noncontributory   MEDICAL DECISION SECTION  Knee x-ray left knee slightly worse medial compartment arthritis than right  Acute right knee effusion from recent fall  Encounter Diagnoses  Name Primary?  . Bilateral chronic knee pain   . Primary osteoarthritis of both knees Yes     PLAN:   No orders of the defined types were placed in this encounter.  Injection? Yes   Procedure note left knee injection verbal consent was obtained to inject left knee joint  Timeout was completed to confirm the site  of injection  The medications used were 40 mg of Depo-Medrol and 1% lidocaine 3 cc  Anesthesia was provided by ethyl chloride and the skin was prepped with alcohol.  After cleaning the skin with alcohol a 20-gauge needle was used to inject the left knee joint. There were no complications. A sterile bandage was applied.   Procedure note right knee injection verbal consent was obtained to inject right knee joint  Timeout was completed to confirm the site of injection  The medications used were 40 mg of Depo-Medrol and 1% lidocaine 3 cc  Anesthesia was provided by ethyl chloride and the skin was prepped with  alcohol.  After cleaning the skin with alcohol a 20-gauge needle was used to inject the right knee joint. There were no complications. A sterile bandage was applied.

## 2019-03-03 ENCOUNTER — Telehealth: Payer: Self-pay | Admitting: *Deleted

## 2019-03-03 ENCOUNTER — Ambulatory Visit (HOSPITAL_COMMUNITY)
Admission: RE | Admit: 2019-03-03 | Discharge: 2019-03-03 | Disposition: A | Discharge status: Home / Self Care | Payer: Medicare Other | Source: Ambulatory Visit | Attending: Adult Health Nurse Practitioner | Admitting: Adult Health Nurse Practitioner

## 2019-03-03 ENCOUNTER — Other Ambulatory Visit (HOSPITAL_COMMUNITY): Payer: Self-pay | Admitting: Adult Health Nurse Practitioner

## 2019-03-03 ENCOUNTER — Other Ambulatory Visit: Payer: Self-pay

## 2019-03-03 DIAGNOSIS — S299XXA Unspecified injury of thorax, initial encounter: Secondary | ICD-10-CM | POA: Diagnosis not present

## 2019-03-03 DIAGNOSIS — M546 Pain in thoracic spine: Secondary | ICD-10-CM

## 2019-03-03 DIAGNOSIS — M5440 Lumbago with sciatica, unspecified side: Secondary | ICD-10-CM

## 2019-03-03 DIAGNOSIS — M545 Low back pain: Secondary | ICD-10-CM | POA: Insufficient documentation

## 2019-03-03 DIAGNOSIS — W19XXXA Unspecified fall, initial encounter: Secondary | ICD-10-CM | POA: Diagnosis not present

## 2019-03-03 DIAGNOSIS — M549 Dorsalgia, unspecified: Secondary | ICD-10-CM | POA: Diagnosis not present

## 2019-03-03 DIAGNOSIS — S199XXA Unspecified injury of neck, initial encounter: Secondary | ICD-10-CM | POA: Diagnosis not present

## 2019-03-03 DIAGNOSIS — M542 Cervicalgia: Secondary | ICD-10-CM | POA: Diagnosis not present

## 2019-03-03 DIAGNOSIS — S3992XA Unspecified injury of lower back, initial encounter: Secondary | ICD-10-CM | POA: Diagnosis not present

## 2019-03-03 DIAGNOSIS — R52 Pain, unspecified: Secondary | ICD-10-CM | POA: Diagnosis not present

## 2019-03-03 NOTE — Telephone Encounter (Signed)
Spoke to her husband, Griffin Dewilde, on [place].  He is aware of the  MRI brain results below.

## 2019-03-03 NOTE — Telephone Encounter (Signed)
-----   Message from Marcial Pacas, MD sent at 03/02/2019  4:55 PM EDT ----- Please call pt for no acute abnormalities, small vessel disease.

## 2019-03-04 DIAGNOSIS — F341 Dysthymic disorder: Secondary | ICD-10-CM | POA: Diagnosis not present

## 2019-03-04 DIAGNOSIS — F411 Generalized anxiety disorder: Secondary | ICD-10-CM | POA: Diagnosis not present

## 2019-03-23 DIAGNOSIS — M7918 Myalgia, other site: Secondary | ICD-10-CM | POA: Diagnosis not present

## 2019-03-23 DIAGNOSIS — M5136 Other intervertebral disc degeneration, lumbar region: Secondary | ICD-10-CM | POA: Diagnosis not present

## 2019-03-23 DIAGNOSIS — M545 Low back pain: Secondary | ICD-10-CM | POA: Diagnosis not present

## 2019-03-27 ENCOUNTER — Other Ambulatory Visit: Payer: Self-pay | Admitting: Rehabilitation

## 2019-03-27 DIAGNOSIS — M545 Low back pain, unspecified: Secondary | ICD-10-CM

## 2019-04-02 DIAGNOSIS — H353132 Nonexudative age-related macular degeneration, bilateral, intermediate dry stage: Secondary | ICD-10-CM | POA: Diagnosis not present

## 2019-04-18 ENCOUNTER — Other Ambulatory Visit: Payer: Self-pay

## 2019-04-18 ENCOUNTER — Ambulatory Visit
Admission: RE | Admit: 2019-04-18 | Discharge: 2019-04-18 | Disposition: A | Discharge status: Home / Self Care | Payer: Medicare Other | Source: Ambulatory Visit | Attending: Rehabilitation | Admitting: Rehabilitation

## 2019-04-18 DIAGNOSIS — M545 Low back pain, unspecified: Secondary | ICD-10-CM

## 2019-04-18 DIAGNOSIS — S32010A Wedge compression fracture of first lumbar vertebra, initial encounter for closed fracture: Secondary | ICD-10-CM | POA: Diagnosis not present

## 2019-04-27 ENCOUNTER — Encounter: Payer: Self-pay | Admitting: Neurology

## 2019-04-27 ENCOUNTER — Other Ambulatory Visit: Payer: Self-pay

## 2019-04-27 ENCOUNTER — Ambulatory Visit (INDEPENDENT_AMBULATORY_CARE_PROVIDER_SITE_OTHER): Payer: Medicare Other | Admitting: Neurology

## 2019-04-27 VITALS — BP 128/64 | HR 72 | Temp 97.5°F | Ht 62.0 in | Wt 202.0 lb

## 2019-04-27 DIAGNOSIS — S32010A Wedge compression fracture of first lumbar vertebra, initial encounter for closed fracture: Secondary | ICD-10-CM | POA: Insufficient documentation

## 2019-04-27 DIAGNOSIS — S32010S Wedge compression fracture of first lumbar vertebra, sequela: Secondary | ICD-10-CM | POA: Diagnosis not present

## 2019-04-27 DIAGNOSIS — G20 Parkinson's disease: Secondary | ICD-10-CM | POA: Diagnosis not present

## 2019-04-27 DIAGNOSIS — M5136 Other intervertebral disc degeneration, lumbar region: Secondary | ICD-10-CM | POA: Diagnosis not present

## 2019-04-27 DIAGNOSIS — M545 Low back pain: Secondary | ICD-10-CM | POA: Diagnosis not present

## 2019-04-27 DIAGNOSIS — G20C Parkinsonism, unspecified: Secondary | ICD-10-CM | POA: Insufficient documentation

## 2019-04-27 MED ORDER — CARBIDOPA-LEVODOPA 25-100 MG PO TABS: 1.0000 | ORAL_TABLET | Freq: Three times a day (TID) | ORAL | 6 refills | Status: DC

## 2019-04-27 NOTE — Progress Notes (Signed)
PATIENT: Tonya Perez DOB: 02-09-1946  Chief Complaint  Patient presents with  . Tremors    She is here to her husband, Tonya Perez.  They would like to further review her brain MRI.     HISTORICAL  Tonya Perez is a 73 years old female, seen in request by her primary care nurse practitioner Pablo Lawrence for evaluation of tremor, her husband, and daughter-in-law Tonya Perez is with her during interview. Initial visit is through virtual visit on April 30th 2020.  I have reviewed and summarized the referring note from the referring physician.  She has past medical history of hypertension, depression anxiety, has been treated with Wellbutrin SR 200 mg 2 tablets daily, imipramine 25 mg every night, also Xanax 0.5 mg at bedtime  Since 2019, she noticed intermittent left hand tremor, she is left-hand dominant, sometimes she drops things such as glasses from her left side, tremors were intermittent, gradually getting worse over the past 6 months,  She is still able to do a good handwriting with her left hand, denies right arm involvement, mild gait abnormality due to obesity and knee pain, she denied loss sense of smell, sleep well, no family history of tremor  UPDATE July 6th 2020: She is accompanied by her husband at today's visit, she complains a year history of gradual onset left hand tremor, difficulty holding onto objects with her left hand, drop things from her left hand, she denied loss sense of smell, no REM sleep disorder, she has left hand resting tremor, bradykinesia, rigidity,  We personally reviewed MRI of the brain on Feb 26, 2019, small vessel disease, no acute abnormality  The day she had MRI, she fell, bumped her forehead on the car, now complains of low back pain, I reviewed MRI of lumbar spine on April 18, 2019, acute on subacute L1 compression fracture with mild height loss, mild retropulsed stranding without cord impingement,   REVIEW OF SYSTEMS: Full 14 system review of systems  performed and notable only for as above All other review of systems were negative.  ALLERGIES: Allergies  Allergen Reactions  . Sulfa Antibiotics Swelling and Rash    HOME MEDICATIONS: Current Outpatient Medications  Medication Sig Dispense Refill  . ALPRAZolam (XANAX) 0.5 MG tablet Take 0.5 mg by mouth at bedtime.     Marland Kitchen buPROPion (WELLBUTRIN SR) 200 MG 12 hr tablet Take 400 mg by mouth daily.     . Cholecalciferol (VITAMIN D3 PO) Take 1,000 Units by mouth daily.    Marland Kitchen imipramine (TOFRANIL) 25 MG tablet Take 25 mg by mouth daily.     Marland Kitchen lisinopril (PRINIVIL,ZESTRIL) 20 MG tablet Take 20 mg by mouth daily.      No current facility-administered medications for this visit.     PAST MEDICAL HISTORY: Past Medical History:  Diagnosis Date  . Anxiety   . Depression   . Frequent falls   . High blood pressure   . Kidney disease   . Macular degeneration, dry   . Melanoma ([place])   . Obesity   . Osteoarthritis   . Resting tremor    left hand    PAST SURGICAL HISTORY: Past Surgical History:  Procedure Laterality Date  . ABDOMINAL HYSTERECTOMY    . CHOLECYSTECTOMY      FAMILY HISTORY: Family History  Problem Relation Age of Onset  . Other Mother        ruptured gall bladder  . Heart attack Father     SOCIAL HISTORY: Social History  Socioeconomic History  . Marital status: Married    Spouse name: Not on file  . Number of children: 2  . Years of education: 67  . Highest education level: High school graduate  Occupational History  . Occupation: Retired  Scientific laboratory technician  . Financial resource strain: Not on file  . Food insecurity    Worry: Not on file    Inability: Not on file  . Transportation needs    Medical: Not on file    Non-medical: Not on file  Tobacco Use  . Smoking status: Never Smoker  . Smokeless tobacco: Never Used  Substance and Sexual Activity  . Alcohol use: No  . Drug use: No  . Sexual activity: Not on file  Lifestyle  . Physical activity     Days per week: Not on file    Minutes per session: Not on file  . Stress: Not on file  Relationships  . Social Herbalist on phone: Not on file    Gets together: Not on file    Attends religious service: Not on file    Active member of club or organization: Not on file    Attends meetings of clubs or organizations: Not on file    Relationship status: Not on file  . Intimate partner violence    Fear of current or ex partner: Not on file    Emotionally abused: Not on file    Physically abused: Not on file    Forced sexual activity: Not on file  Other Topics Concern  . Not on file  Social History Narrative   Lives at home with her husband.   Left-handed.   No caffeine use.     PHYSICAL EXAM   Vitals:   04/27/19 1333  BP: 128/64  Pulse: 72  Temp: (!) 97.5 F (36.4 C)  Weight: 202 lb (91.6 kg)  Height: 5\' 2"  (1.575 m)    Not recorded      Body mass index is 36.95 kg/m.  PHYSICAL EXAMNIATION:  Gen: NAD, conversant, well nourised, obese, well groomed                     Cardiovascular: Regular rate rhythm, no peripheral edema, warm, nontender. Eyes: Conjunctivae clear without exudates or hemorrhage Neck: Supple, no carotid bruits. Pulmonary: Clear to auscultation bilaterally   NEUROLOGICAL EXAM:  MENTAL STATUS: Speech:    Speech is normal; fluent and spontaneous with normal comprehension.  Cognition:     Orientation to time, place and person     Normal recent and remote memory     Normal Attention span and concentration     Normal Language, naming, repeating,spontaneous speech     Fund of knowledge   CRANIAL NERVES: CN II: Visual fields are full to confrontation.  Pupils are round equal and briskly reactive to light. CN III, IV, VI: extraocular movement are normal. No ptosis. CN V: Facial sensation is intact to pinprick in all 3 divisions bilaterally. Corneal responses are intact.  CN VII: Face is symmetric with normal eye closure and smile. CN  VIII: Hearing is normal to rubbing fingers CN IX, X: Palate elevates symmetrically. Phonation is normal. CN XI: Head turning and shoulder shrug are intact CN XII: Tongue is midline with normal movements and no atrophy.  MOTOR: She has frequent left hand resting tremor, no weakness, she has bradykinesia on rapid wrist opening and closure, more prominent on the left hand, rigidity, most obvious on the  right hand with reinforcement maneuver  REFLEXES: Reflexes are 2+ and symmetric at the biceps, triceps, knees, and ankles. Plantar responses are flexor.  SENSORY: Intact to light touch, pinprick, positional sensation and vibratory sensation are intact in fingers and toes.  COORDINATION: Rapid alternating movements and fine finger movements are intact. There is no dysmetria on finger-to-nose and heel-knee-shin.    GAIT/STANCE: She needs pushed up to get up from seated position, cautious, mildly unsteady, mildly decreased left arm swing   DIAGNOSTIC DATA (LABS, IMAGING, TESTING) - I reviewed patient records, labs, notes, testing and imaging myself where available.   ASSESSMENT AND PLAN  Tonya Perez is a 73 y.o. female   Parkinsonism  Data scan  Sinemet 25/100 mg 3 times a day   L1 subacute compression fracture  Following up with spine specialist    Marcial Pacas, M.D. Ph.D.  [facility] [address], [place],  [address] Ph: [phone] Fax: [phone]  CC: Referring Provider

## 2019-04-28 ENCOUNTER — Telehealth: Payer: Self-pay | Admitting: Neurology

## 2019-04-28 DIAGNOSIS — M545 Low back pain: Secondary | ICD-10-CM | POA: Diagnosis not present

## 2019-04-28 DIAGNOSIS — M47817 Spondylosis without myelopathy or radiculopathy, lumbosacral region: Secondary | ICD-10-CM | POA: Diagnosis not present

## 2019-04-28 DIAGNOSIS — S32010A Wedge compression fracture of first lumbar vertebra, initial encounter for closed fracture: Secondary | ICD-10-CM | POA: Diagnosis not present

## 2019-04-28 NOTE — Telephone Encounter (Signed)
I will mail patient consent for DAT scan so she sign. Patient relayed that it will be a while before she can have DAT scan because she is having back surgery 04/29/2019.

## 2019-05-13 ENCOUNTER — Telehealth: Payer: Self-pay | Admitting: Neurology

## 2019-05-13 DIAGNOSIS — G20 Parkinson's disease: Secondary | ICD-10-CM

## 2019-05-13 MED ORDER — ROPINIROLE HCL 0.25 MG PO TABS: ORAL_TABLET | ORAL | 11 refills | Status: DC

## 2019-05-13 MED ORDER — ROPINIROLE HCL ER 2 MG PO TB24: 4.0000 mg | ORAL_TABLET | Freq: Every day | ORAL | 11 refills | Status: DC

## 2019-05-13 NOTE — Telephone Encounter (Signed)
The patient's insurance plan would not cover ropinirole ER because she has never tried the immediate release form.    Per vo by Dr. Krista Blue, change prescription to ropinirole 0.25mg , one tablet TID to start and slowly titrating up to two tablets TID.  I have left the patient and message requesting a call back.

## 2019-05-13 NOTE — Telephone Encounter (Signed)
I returned the call to the patient. After taking two doses of Sinemet 25-100mg , she began vomiting.  She is unwilling to take it and has discarded the remaining tablets.  I noted this reaction in her allergy list.  She would like to know if there is an alternate medication to try.

## 2019-05-13 NOTE — Addendum Note (Signed)
Addended by: Marcial Pacas on: 05/13/2019 10:59 AM   Modules accepted: Orders

## 2019-05-13 NOTE — Addendum Note (Signed)
Addended by: Desmond Lope on: 05/13/2019 03:22 PM   Modules accepted: Orders

## 2019-05-13 NOTE — Telephone Encounter (Signed)
I can try dopamine agonist Requip XL 2mg  qhs. titrating to 2 tabs qhs.

## 2019-05-13 NOTE — Telephone Encounter (Signed)
Left message requesting a return call.

## 2019-05-13 NOTE — Telephone Encounter (Signed)
I was able to speak to the patient and she is agreeable to try the immediate release ropinirole 0.25mg .  A new prescription has been sent to the pharmacy with instructions to take one tablet TID x one week then increase to two tablets TID.  She is aware that she may titrate her dose up much slower, if needed, based on the way she is feeling.  She will call us back with any concerns or questions.

## 2019-05-13 NOTE — Telephone Encounter (Signed)
Pt called in and stated when she took carbidopa-levodopa (SINEMET IR) 25-100 MG tablet she began to vomit a lot, so she threw the meds in the trash .

## 2019-05-21 DIAGNOSIS — M47817 Spondylosis without myelopathy or radiculopathy, lumbosacral region: Secondary | ICD-10-CM | POA: Diagnosis not present

## 2019-05-25 DIAGNOSIS — H25811 Combined forms of age-related cataract, right eye: Secondary | ICD-10-CM | POA: Diagnosis not present

## 2019-05-25 DIAGNOSIS — Z01818 Encounter for other preprocedural examination: Secondary | ICD-10-CM | POA: Diagnosis not present

## 2019-05-25 DIAGNOSIS — H2511 Age-related nuclear cataract, right eye: Secondary | ICD-10-CM | POA: Diagnosis not present

## 2019-06-08 DIAGNOSIS — H2512 Age-related nuclear cataract, left eye: Secondary | ICD-10-CM | POA: Diagnosis not present

## 2019-06-08 DIAGNOSIS — H25812 Combined forms of age-related cataract, left eye: Secondary | ICD-10-CM | POA: Diagnosis not present

## 2019-07-07 ENCOUNTER — Ambulatory Visit: Payer: Medicare Other | Admitting: Neurology

## 2019-08-10 DIAGNOSIS — I10 Essential (primary) hypertension: Secondary | ICD-10-CM | POA: Diagnosis not present

## 2019-08-12 ENCOUNTER — Telehealth: Payer: Self-pay | Admitting: Neurology

## 2019-08-12 ENCOUNTER — Ambulatory Visit (INDEPENDENT_AMBULATORY_CARE_PROVIDER_SITE_OTHER): Payer: Medicare Other | Admitting: Neurology

## 2019-08-12 ENCOUNTER — Encounter: Payer: Self-pay | Admitting: Neurology

## 2019-08-12 ENCOUNTER — Other Ambulatory Visit: Payer: Self-pay

## 2019-08-12 VITALS — BP 113/68 | HR 68 | Temp 97.5°F | Ht 62.0 in | Wt 205.0 lb

## 2019-08-12 DIAGNOSIS — S32010S Wedge compression fracture of first lumbar vertebra, sequela: Secondary | ICD-10-CM | POA: Diagnosis not present

## 2019-08-12 DIAGNOSIS — G20 Parkinson's disease: Secondary | ICD-10-CM | POA: Diagnosis not present

## 2019-08-12 MED ORDER — ROPINIROLE HCL 1 MG PO TABS: 1.0000 mg | ORAL_TABLET | Freq: Three times a day (TID) | ORAL | 11 refills | Status: DC

## 2019-08-12 NOTE — Telephone Encounter (Signed)
Please schedule DAT scan for her.

## 2019-08-12 NOTE — Progress Notes (Signed)
PATIENT: Tonya Perez DOB: 1946/10/04  Chief Complaint  Patient presents with  . Tremors    She is here with her husband, Tonya Perez.  She is currently taking Requip 0.25mg , two tablets twice daily.  She tends to forget mid-day medications and so she stopped titrating at that dosage.  She is still having tremors.  She has not completed her DAT scan yet.     HISTORICAL  Tonya Perez is a 73 years old female, seen in request by her primary care nurse practitioner Tonya Perez for evaluation of tremor, her husband, and daughter-in-law Tonya Perez is with her during interview. Initial visit is through virtual visit on April 30th 2020.  I have reviewed and summarized the referring note from the referring physician.  She has past medical history of hypertension, depression anxiety, has been treated with Wellbutrin SR 200 mg 2 tablets daily, imipramine 25 mg every night, also Xanax 0.5 mg at bedtime  Since 2019, she noticed intermittent left hand tremor, she is left-hand dominant, sometimes she drops things such as glasses from her left side, tremors were intermittent, gradually getting worse over the past 6 months.  She is still able to do a good handwriting with her left hand, denies right arm involvement, mild gait abnormality due to obesity and knee pain, she denied loss sense of smell, sleep well, no family history of tremor  UPDATE July 6th 2020: She is accompanied by her husband at today's visit, she complains a year history of gradual onset left hand tremor, difficulty holding onto objects with her left hand, drop things from her left hand, she denied loss sense of smell, no REM sleep disorder, she has left hand resting tremor, bradykinesia, rigidity,  We personally reviewed MRI of the brain on Feb 26, 2019, small vessel disease, no acute abnormality  The day she had MRI, she fell, bumped her forehead on the car, now complains of low back pain, I reviewed MRI of lumbar spine on April 18, 2019, acute  on subacute L1 compression fracture with mild height loss, mild retropulsed stranding without cord impingement,  UPDATE Aug 12 2019: She is accompanied by her husband at today's clinical visit, she did not have DAT scan, tolerating Requip 0.25 mg 2 tablets twice a day, which did help her tremor, and movement.  She has tried Sinemet 25/100 mg 3 times daily, planes of significant GI side effect.  REVIEW OF SYSTEMS: Full 14 system review of systems performed and notable only for as above All other review of systems were negative.  ALLERGIES: Allergies  Allergen Reactions  . Sinemet [Carbidopa W-Levodopa] Nausea And Vomiting  . Sulfa Antibiotics Swelling and Rash    HOME MEDICATIONS: Current Outpatient Medications  Medication Sig Dispense Refill  . ALPRAZolam (XANAX) 0.5 MG tablet Take 0.5 mg by mouth at bedtime.     Marland Kitchen buPROPion (WELLBUTRIN SR) 200 MG 12 hr tablet Take 400 mg by mouth daily.     . Cholecalciferol (VITAMIN D3 PO) Take 1,000 Units by mouth daily.    Marland Kitchen imipramine (TOFRANIL) 25 MG tablet Take 25 mg by mouth daily.     Marland Kitchen lisinopril (PRINIVIL,ZESTRIL) 20 MG tablet Take 20 mg by mouth daily.     Marland Kitchen rOPINIRole (REQUIP) 0.25 MG tablet Take one tablet TID x one week then increase to two tablets TID thereafter. 180 tablet 11   No current facility-administered medications for this visit.     PAST MEDICAL HISTORY: Past Medical History:  Diagnosis Date  .  Anxiety   . Depression   . Frequent falls   . High blood pressure   . Kidney disease   . Macular degeneration, dry   . Melanoma (Tonya Perez)   . Obesity   . Osteoarthritis   . Resting tremor    left hand    PAST SURGICAL HISTORY: Past Surgical History:  Procedure Laterality Date  . ABDOMINAL HYSTERECTOMY    . CHOLECYSTECTOMY      FAMILY HISTORY: Family History  Problem Relation Age of Onset  . Other Mother        ruptured gall bladder  . Heart attack Father     SOCIAL HISTORY: Social History   Socioeconomic  History  . Marital status: Married    Spouse name: Not on file  . Number of children: 2  . Years of education: 44  . Highest education level: High school graduate  Occupational History  . Occupation: Retired  Scientific laboratory technician  . Financial resource strain: Not on file  . Food insecurity    Worry: Not on file    Inability: Not on file  . Transportation needs    Medical: Not on file    Non-medical: Not on file  Tobacco Use  . Smoking status: Never Smoker  . Smokeless tobacco: Never Used  Substance and Sexual Activity  . Alcohol use: No  . Drug use: No  . Sexual activity: Not on file  Lifestyle  . Physical activity    Days per week: Not on file    Minutes per session: Not on file  . Stress: Not on file  Relationships  . Social Herbalist on phone: Not on file    Gets together: Not on file    Attends religious service: Not on file    Active member of club or organization: Not on file    Attends meetings of clubs or organizations: Not on file    Relationship status: Not on file  . Intimate partner violence    Fear of current or ex partner: Not on file    Emotionally abused: Not on file    Physically abused: Not on file    Forced sexual activity: Not on file  Other Topics Concern  . Not on file  Social History Narrative   Lives at home with her husband.   Left-handed.   No caffeine use.     PHYSICAL EXAM   Vitals:   08/12/19 0815  BP: 113/68  Pulse: 68  Temp: (!) 97.5 F (36.4 C)  Weight: 205 lb (93 kg)  Height: 5\' 2"  (1.575 m)    Not recorded      Body mass index is 37.49 kg/m.  PHYSICAL EXAMNIATION:  Gen: NAD, conversant, well nourised, obese, well groomed                     Cardiovascular: Regular rate rhythm, no peripheral edema, warm, nontender. Eyes: Conjunctivae clear without exudates or hemorrhage Neck: Supple, no carotid bruits. Pulmonary: Clear to auscultation bilaterally   NEUROLOGICAL EXAM:  MENTAL STATUS: Speech:    Speech  is normal; fluent and spontaneous with normal comprehension.  Cognition:     Orientation to time, place and person     Normal recent and remote memory     Normal Attention span and concentration     Normal Language, naming, repeating,spontaneous speech     Fund of knowledge   CRANIAL NERVES: CN II: Visual fields are full to confrontation.  Pupils are round equal and briskly reactive to light. CN III, IV, VI: extraocular movement are normal. No ptosis. CN V: Facial sensation is intact to pinprick in all 3 divisions bilaterally. Corneal responses are intact.  CN VII: Face is symmetric with normal eye closure and smile. CN VIII: Hearing is normal to rubbing fingers CN IX, X: Palate elevates symmetrically. Phonation is normal. CN XI: Head turning and shoulder shrug are intact CN XII: Tongue is midline with normal movements and no atrophy.  MOTOR: There was no resting tremor noticed today, no weakness, she has right more than left bradykinesia and rigidity,  REFLEXES: Reflexes are 2+ and symmetric at the biceps, triceps, knees, and ankles. Plantar responses are flexor.  SENSORY: Intact to light touch, pinprick, positional sensation and vibratory sensation are intact in fingers and toes.  COORDINATION: Rapid alternating movements and fine finger movements are intact. There is no dysmetria on finger-to-nose and heel-knee-shin.    GAIT/STANCE: She needs pushed up to get up from seated position, cautious, mildly unsteady, mildly decreased left arm swing   DIAGNOSTIC DATA (LABS, IMAGING, TESTING) - I reviewed patient records, labs, notes, testing and imaging myself where available.   ASSESSMENT AND PLAN  Tonya Perez is a 73 y.o. female   Parkinsonism  Data scan  She tried Sinemet 25/100 mg 3 times a day, complains of uncomfortable GI side effect,  She did better with Requip 0.5 mg 3 times daily, while titrating dose to 1 mg 3 times daily  Continue moderate exercise    Marcial Pacas,  M.D. Ph.D.  [facility] [address] [place], [place] [address] Ph: [phone] Fax: [phone]  CC: Referring Provider

## 2019-08-12 NOTE — Telephone Encounter (Signed)
Patient signed consent form in office today.  Provided to Rance Muir who will help with scheduling the appt.

## 2019-08-14 DIAGNOSIS — Z0000 Encounter for general adult medical examination without abnormal findings: Secondary | ICD-10-CM | POA: Diagnosis not present

## 2019-08-14 DIAGNOSIS — Z23 Encounter for immunization: Secondary | ICD-10-CM | POA: Diagnosis not present

## 2019-08-14 DIAGNOSIS — Z6833 Body mass index (BMI) 33.0-33.9, adult: Secondary | ICD-10-CM | POA: Diagnosis not present

## 2019-08-14 DIAGNOSIS — I129 Hypertensive chronic kidney disease with stage 1 through stage 4 chronic kidney disease, or unspecified chronic kidney disease: Secondary | ICD-10-CM | POA: Diagnosis not present

## 2019-08-14 DIAGNOSIS — H3530 Unspecified macular degeneration: Secondary | ICD-10-CM | POA: Diagnosis not present

## 2019-08-14 DIAGNOSIS — F332 Major depressive disorder, recurrent severe without psychotic features: Secondary | ICD-10-CM | POA: Diagnosis not present

## 2019-08-14 DIAGNOSIS — N1832 Chronic kidney disease, stage 3b: Secondary | ICD-10-CM | POA: Diagnosis not present

## 2019-08-14 DIAGNOSIS — E6609 Other obesity due to excess calories: Secondary | ICD-10-CM | POA: Diagnosis not present

## 2019-08-14 DIAGNOSIS — G20 Parkinson's disease: Secondary | ICD-10-CM | POA: Diagnosis not present

## 2019-08-14 DIAGNOSIS — M170 Bilateral primary osteoarthritis of knee: Secondary | ICD-10-CM | POA: Diagnosis not present

## 2019-09-01 DIAGNOSIS — F341 Dysthymic disorder: Secondary | ICD-10-CM | POA: Diagnosis not present

## 2019-09-01 DIAGNOSIS — F411 Generalized anxiety disorder: Secondary | ICD-10-CM | POA: Diagnosis not present

## 2019-09-02 NOTE — Telephone Encounter (Signed)
Sent via fax to [place] at [facility] telephone [phone] - fax [phone] DAT Scan

## 2019-09-08 ENCOUNTER — Other Ambulatory Visit: Payer: Self-pay | Admitting: *Deleted

## 2019-09-08 MED ORDER — ROPINIROLE HCL 1 MG PO TABS: 1.0000 mg | ORAL_TABLET | Freq: Three times a day (TID) | ORAL | 3 refills | Status: DC

## 2019-09-29 ENCOUNTER — Encounter (HOSPITAL_COMMUNITY)
Admission: RE | Admit: 2019-09-29 | Discharge: 2019-09-29 | Disposition: A | Discharge status: Home / Self Care | Payer: Medicare Other | Source: Ambulatory Visit | Attending: Neurology | Admitting: Neurology

## 2019-09-29 ENCOUNTER — Other Ambulatory Visit: Payer: Self-pay

## 2019-09-29 DIAGNOSIS — R251 Tremor, unspecified: Secondary | ICD-10-CM | POA: Diagnosis not present

## 2019-09-29 DIAGNOSIS — G20 Parkinson's disease: Secondary | ICD-10-CM | POA: Diagnosis not present

## 2019-09-29 DIAGNOSIS — R296 Repeated falls: Secondary | ICD-10-CM | POA: Diagnosis not present

## 2019-09-29 MED ORDER — IOFLUPANE I 123 185 MBQ/2.5ML IV SOLN
4.7000 | Freq: Once | INTRAVENOUS | Status: AC | PRN
  Administered 2019-09-29: 4.7 via INTRAVENOUS
  Filled 2019-09-29: qty 5

## 2019-09-29 MED ORDER — IODINE STRONG (LUGOLS) 5 % PO SOLN
0.8000 mL | Freq: Once | ORAL | Status: DC
  Administered 2019-09-29: 09:00:00 0.8 mL via ORAL

## 2019-09-29 MED ORDER — IODINE STRONG (LUGOLS) 5 % PO SOLN
ORAL | Status: AC
  Administered 2019-09-29: 09:00:00 0.8 mL via ORAL
  Filled 2019-09-29: qty 1

## 2019-09-30 ENCOUNTER — Telehealth: Payer: Self-pay | Admitting: Neurology

## 2019-09-30 NOTE — Telephone Encounter (Signed)
I have called her, explained her DATscan findings, symmetric decreased radiotracer activity within the posterior striata and relative decreased activity in the heads of caudate nuclei.  Her symptoms were symmetric bilaterally, so was imaging findings, not consistent with typical Parkinson's disease.  She reported some improvement with Requip 0.5 mg 3 times a day, previously could not tolerate Sinemet 25/100 mg 3 times a day, she is encouraged to continue to take her medications, keep follow-up.

## 2019-09-30 NOTE — Telephone Encounter (Signed)
IMPRESSION: Symmetric decreased radiotracer activity within the posterior striata and relative decreased activity in the heads of the caudate nuclei. Findings are in a pattern typical of Parkinson's syndrome pathology.   Please call patient DATscan was abnormal,  showed findings c/w parkinsonism spectrum of disease,

## 2019-09-30 NOTE — Telephone Encounter (Signed)
I spoke to the patient and she would like to speak to Dr. Krista Blue further about her DAT scan results.

## 2019-11-23 DIAGNOSIS — Z79899 Other long term (current) drug therapy: Secondary | ICD-10-CM | POA: Diagnosis not present

## 2019-11-23 DIAGNOSIS — G20 Parkinson's disease: Secondary | ICD-10-CM | POA: Diagnosis not present

## 2019-11-23 DIAGNOSIS — R269 Unspecified abnormalities of gait and mobility: Secondary | ICD-10-CM | POA: Diagnosis not present

## 2019-11-23 DIAGNOSIS — I951 Orthostatic hypotension: Secondary | ICD-10-CM | POA: Diagnosis not present

## 2019-12-03 DIAGNOSIS — Z23 Encounter for immunization: Secondary | ICD-10-CM | POA: Diagnosis not present

## 2020-01-01 DIAGNOSIS — Z23 Encounter for immunization: Secondary | ICD-10-CM | POA: Diagnosis not present

## 2020-01-06 DIAGNOSIS — R269 Unspecified abnormalities of gait and mobility: Secondary | ICD-10-CM | POA: Diagnosis not present

## 2020-01-06 DIAGNOSIS — G20 Parkinson's disease: Secondary | ICD-10-CM | POA: Diagnosis not present

## 2020-01-27 DIAGNOSIS — H43812 Vitreous degeneration, left eye: Secondary | ICD-10-CM | POA: Diagnosis not present

## 2020-01-27 DIAGNOSIS — H353132 Nonexudative age-related macular degeneration, bilateral, intermediate dry stage: Secondary | ICD-10-CM | POA: Diagnosis not present

## 2020-01-27 DIAGNOSIS — H26493 Other secondary cataract, bilateral: Secondary | ICD-10-CM | POA: Diagnosis not present

## 2020-02-11 ENCOUNTER — Ambulatory Visit: Payer: Medicare Other | Admitting: Neurology

## 2020-02-11 DIAGNOSIS — H3530 Unspecified macular degeneration: Secondary | ICD-10-CM | POA: Diagnosis not present

## 2020-02-11 DIAGNOSIS — E559 Vitamin D deficiency, unspecified: Secondary | ICD-10-CM | POA: Diagnosis not present

## 2020-02-11 DIAGNOSIS — I10 Essential (primary) hypertension: Secondary | ICD-10-CM | POA: Diagnosis not present

## 2020-02-11 DIAGNOSIS — R251 Tremor, unspecified: Secondary | ICD-10-CM | POA: Diagnosis not present

## 2020-02-11 DIAGNOSIS — F332 Major depressive disorder, recurrent severe without psychotic features: Secondary | ICD-10-CM | POA: Diagnosis not present

## 2020-02-11 DIAGNOSIS — M25569 Pain in unspecified knee: Secondary | ICD-10-CM | POA: Diagnosis not present

## 2020-02-11 DIAGNOSIS — N1832 Chronic kidney disease, stage 3b: Secondary | ICD-10-CM | POA: Diagnosis not present

## 2020-02-11 DIAGNOSIS — H353130 Nonexudative age-related macular degeneration, bilateral, stage unspecified: Secondary | ICD-10-CM | POA: Diagnosis not present

## 2020-02-11 DIAGNOSIS — E6609 Other obesity due to excess calories: Secondary | ICD-10-CM | POA: Diagnosis not present

## 2020-02-11 DIAGNOSIS — G20 Parkinson's disease: Secondary | ICD-10-CM | POA: Diagnosis not present

## 2020-02-11 DIAGNOSIS — G3184 Mild cognitive impairment, so stated: Secondary | ICD-10-CM | POA: Diagnosis not present

## 2020-02-11 DIAGNOSIS — I129 Hypertensive chronic kidney disease with stage 1 through stage 4 chronic kidney disease, or unspecified chronic kidney disease: Secondary | ICD-10-CM | POA: Diagnosis not present

## 2020-02-12 DIAGNOSIS — G20 Parkinson's disease: Secondary | ICD-10-CM | POA: Diagnosis not present

## 2020-02-12 DIAGNOSIS — R251 Tremor, unspecified: Secondary | ICD-10-CM | POA: Diagnosis not present

## 2020-02-12 DIAGNOSIS — M25569 Pain in unspecified knee: Secondary | ICD-10-CM | POA: Diagnosis not present

## 2020-02-12 DIAGNOSIS — N1832 Chronic kidney disease, stage 3b: Secondary | ICD-10-CM | POA: Diagnosis not present

## 2020-02-12 DIAGNOSIS — I129 Hypertensive chronic kidney disease with stage 1 through stage 4 chronic kidney disease, or unspecified chronic kidney disease: Secondary | ICD-10-CM | POA: Diagnosis not present

## 2020-02-12 DIAGNOSIS — M170 Bilateral primary osteoarthritis of knee: Secondary | ICD-10-CM | POA: Diagnosis not present

## 2020-02-12 DIAGNOSIS — H353130 Nonexudative age-related macular degeneration, bilateral, stage unspecified: Secondary | ICD-10-CM | POA: Diagnosis not present

## 2020-02-12 DIAGNOSIS — H3530 Unspecified macular degeneration: Secondary | ICD-10-CM | POA: Diagnosis not present

## 2020-02-12 DIAGNOSIS — R42 Dizziness and giddiness: Secondary | ICD-10-CM | POA: Diagnosis not present

## 2020-02-12 DIAGNOSIS — Z6835 Body mass index (BMI) 35.0-35.9, adult: Secondary | ICD-10-CM | POA: Diagnosis not present

## 2020-02-12 DIAGNOSIS — E6609 Other obesity due to excess calories: Secondary | ICD-10-CM | POA: Diagnosis not present

## 2020-02-12 DIAGNOSIS — F332 Major depressive disorder, recurrent severe without psychotic features: Secondary | ICD-10-CM | POA: Diagnosis not present

## 2020-02-12 DIAGNOSIS — Z0001 Encounter for general adult medical examination with abnormal findings: Secondary | ICD-10-CM | POA: Diagnosis not present

## 2020-02-12 DIAGNOSIS — M179 Osteoarthritis of knee, unspecified: Secondary | ICD-10-CM | POA: Diagnosis not present

## 2020-02-12 DIAGNOSIS — Z9181 History of falling: Secondary | ICD-10-CM | POA: Diagnosis not present

## 2020-02-12 DIAGNOSIS — G3184 Mild cognitive impairment, so stated: Secondary | ICD-10-CM | POA: Diagnosis not present

## 2020-02-17 ENCOUNTER — Ambulatory Visit: Payer: Medicare Other | Admitting: Orthopedic Surgery

## 2020-02-17 ENCOUNTER — Encounter: Payer: Self-pay | Admitting: Orthopedic Surgery

## 2020-02-17 NOTE — Progress Notes (Deleted)
No chief complaint on file.   No diagnosis found.  Procedure note left knee injection verbal consent was obtained to inject left knee joint  Timeout was completed to confirm the site of injection  The medications used were 40 mg of Depo-Medrol and 1% lidocaine 3 cc  Anesthesia was provided by ethyl chloride and the skin was prepped with alcohol.  After cleaning the skin with alcohol a 20-gauge needle was used to inject the left knee joint. There were no complications. A sterile bandage was applied.   Procedure note right knee injection verbal consent was obtained to inject right knee joint  Timeout was completed to confirm the site of injection  The medications used were 40 mg of Depo-Medrol and 1% lidocaine 3 cc  Anesthesia was provided by ethyl chloride and the skin was prepped with alcohol.  After cleaning the skin with alcohol a 20-gauge needle was used to inject the right knee joint. There were no complications. A sterile bandage was applied.

## 2020-03-01 DIAGNOSIS — F341 Dysthymic disorder: Secondary | ICD-10-CM | POA: Diagnosis not present

## 2020-03-01 DIAGNOSIS — F411 Generalized anxiety disorder: Secondary | ICD-10-CM | POA: Diagnosis not present

## 2020-03-14 DIAGNOSIS — G20 Parkinson's disease: Secondary | ICD-10-CM | POA: Diagnosis not present

## 2020-03-14 DIAGNOSIS — I129 Hypertensive chronic kidney disease with stage 1 through stage 4 chronic kidney disease, or unspecified chronic kidney disease: Secondary | ICD-10-CM | POA: Diagnosis not present

## 2020-03-14 DIAGNOSIS — N1832 Chronic kidney disease, stage 3b: Secondary | ICD-10-CM | POA: Diagnosis not present

## 2020-03-17 ENCOUNTER — Other Ambulatory Visit (HOSPITAL_COMMUNITY): Payer: Self-pay | Admitting: Nephrology

## 2020-03-17 ENCOUNTER — Other Ambulatory Visit: Payer: Self-pay | Admitting: Nephrology

## 2020-03-17 DIAGNOSIS — N1832 Chronic kidney disease, stage 3b: Secondary | ICD-10-CM

## 2020-03-24 ENCOUNTER — Ambulatory Visit (HOSPITAL_COMMUNITY)
Admission: RE | Admit: 2020-03-24 | Discharge: 2020-03-24 | Disposition: A | Discharge status: Home / Self Care | Payer: Medicare Other | Source: Ambulatory Visit | Attending: Nephrology | Admitting: Nephrology

## 2020-03-24 ENCOUNTER — Other Ambulatory Visit: Payer: Self-pay

## 2020-03-24 DIAGNOSIS — N1832 Chronic kidney disease, stage 3b: Secondary | ICD-10-CM | POA: Insufficient documentation

## 2020-03-24 DIAGNOSIS — N200 Calculus of kidney: Secondary | ICD-10-CM | POA: Diagnosis not present

## 2020-03-24 DIAGNOSIS — N183 Chronic kidney disease, stage 3 unspecified: Secondary | ICD-10-CM | POA: Diagnosis not present

## 2020-05-06 DIAGNOSIS — G20 Parkinson's disease: Secondary | ICD-10-CM | POA: Diagnosis not present

## 2020-05-06 DIAGNOSIS — Z79899 Other long term (current) drug therapy: Secondary | ICD-10-CM | POA: Diagnosis not present

## 2020-05-23 DIAGNOSIS — N393 Stress incontinence (female) (male): Secondary | ICD-10-CM | POA: Diagnosis not present

## 2020-05-24 DIAGNOSIS — N393 Stress incontinence (female) (male): Secondary | ICD-10-CM | POA: Diagnosis not present

## 2020-05-24 DIAGNOSIS — H353130 Nonexudative age-related macular degeneration, bilateral, stage unspecified: Secondary | ICD-10-CM | POA: Diagnosis not present

## 2020-05-24 DIAGNOSIS — M25569 Pain in unspecified knee: Secondary | ICD-10-CM | POA: Diagnosis not present

## 2020-05-24 DIAGNOSIS — I10 Essential (primary) hypertension: Secondary | ICD-10-CM | POA: Diagnosis not present

## 2020-05-24 DIAGNOSIS — R52 Pain, unspecified: Secondary | ICD-10-CM | POA: Diagnosis not present

## 2020-05-24 DIAGNOSIS — N1832 Chronic kidney disease, stage 3b: Secondary | ICD-10-CM | POA: Diagnosis not present

## 2020-05-24 DIAGNOSIS — R42 Dizziness and giddiness: Secondary | ICD-10-CM | POA: Diagnosis not present

## 2020-05-24 DIAGNOSIS — E6609 Other obesity due to excess calories: Secondary | ICD-10-CM | POA: Diagnosis not present

## 2020-05-24 DIAGNOSIS — F332 Major depressive disorder, recurrent severe without psychotic features: Secondary | ICD-10-CM | POA: Diagnosis not present

## 2020-05-24 DIAGNOSIS — Z9181 History of falling: Secondary | ICD-10-CM | POA: Diagnosis not present

## 2020-05-24 DIAGNOSIS — G3184 Mild cognitive impairment, so stated: Secondary | ICD-10-CM | POA: Diagnosis not present

## 2020-05-24 DIAGNOSIS — Z6833 Body mass index (BMI) 33.0-33.9, adult: Secondary | ICD-10-CM | POA: Diagnosis not present

## 2020-06-15 DIAGNOSIS — N393 Stress incontinence (female) (male): Secondary | ICD-10-CM | POA: Diagnosis not present

## 2020-06-15 DIAGNOSIS — I10 Essential (primary) hypertension: Secondary | ICD-10-CM | POA: Diagnosis not present

## 2020-06-15 DIAGNOSIS — F332 Major depressive disorder, recurrent severe without psychotic features: Secondary | ICD-10-CM | POA: Diagnosis not present

## 2020-06-15 DIAGNOSIS — R52 Pain, unspecified: Secondary | ICD-10-CM | POA: Diagnosis not present

## 2020-06-15 DIAGNOSIS — E6609 Other obesity due to excess calories: Secondary | ICD-10-CM | POA: Diagnosis not present

## 2020-06-15 DIAGNOSIS — H353130 Nonexudative age-related macular degeneration, bilateral, stage unspecified: Secondary | ICD-10-CM | POA: Diagnosis not present

## 2020-06-15 DIAGNOSIS — N1832 Chronic kidney disease, stage 3b: Secondary | ICD-10-CM | POA: Diagnosis not present

## 2020-06-15 DIAGNOSIS — M25569 Pain in unspecified knee: Secondary | ICD-10-CM | POA: Diagnosis not present

## 2020-06-15 DIAGNOSIS — Z9181 History of falling: Secondary | ICD-10-CM | POA: Diagnosis not present

## 2020-06-15 DIAGNOSIS — Z6833 Body mass index (BMI) 33.0-33.9, adult: Secondary | ICD-10-CM | POA: Diagnosis not present

## 2020-06-15 DIAGNOSIS — R42 Dizziness and giddiness: Secondary | ICD-10-CM | POA: Diagnosis not present

## 2020-06-15 DIAGNOSIS — G3184 Mild cognitive impairment, so stated: Secondary | ICD-10-CM | POA: Diagnosis not present

## 2020-06-21 ENCOUNTER — Other Ambulatory Visit (HOSPITAL_COMMUNITY): Payer: Self-pay | Admitting: Internal Medicine

## 2020-06-21 DIAGNOSIS — R52 Pain, unspecified: Secondary | ICD-10-CM | POA: Diagnosis not present

## 2020-06-21 DIAGNOSIS — I10 Essential (primary) hypertension: Secondary | ICD-10-CM | POA: Diagnosis not present

## 2020-06-21 DIAGNOSIS — Z9181 History of falling: Secondary | ICD-10-CM | POA: Diagnosis not present

## 2020-06-21 DIAGNOSIS — M25569 Pain in unspecified knee: Secondary | ICD-10-CM | POA: Diagnosis not present

## 2020-06-21 DIAGNOSIS — Z6833 Body mass index (BMI) 33.0-33.9, adult: Secondary | ICD-10-CM | POA: Diagnosis not present

## 2020-06-21 DIAGNOSIS — N393 Stress incontinence (female) (male): Secondary | ICD-10-CM | POA: Diagnosis not present

## 2020-06-21 DIAGNOSIS — Z6836 Body mass index (BMI) 36.0-36.9, adult: Secondary | ICD-10-CM | POA: Diagnosis not present

## 2020-06-21 DIAGNOSIS — R442 Other hallucinations: Secondary | ICD-10-CM | POA: Diagnosis not present

## 2020-06-21 DIAGNOSIS — Z0001 Encounter for general adult medical examination with abnormal findings: Secondary | ICD-10-CM | POA: Diagnosis not present

## 2020-06-21 DIAGNOSIS — I129 Hypertensive chronic kidney disease with stage 1 through stage 4 chronic kidney disease, or unspecified chronic kidney disease: Secondary | ICD-10-CM | POA: Diagnosis not present

## 2020-06-21 DIAGNOSIS — Z1231 Encounter for screening mammogram for malignant neoplasm of breast: Secondary | ICD-10-CM

## 2020-06-21 DIAGNOSIS — M170 Bilateral primary osteoarthritis of knee: Secondary | ICD-10-CM | POA: Diagnosis not present

## 2020-06-21 DIAGNOSIS — G20 Parkinson's disease: Secondary | ICD-10-CM | POA: Diagnosis not present

## 2020-06-21 DIAGNOSIS — F332 Major depressive disorder, recurrent severe without psychotic features: Secondary | ICD-10-CM | POA: Diagnosis not present

## 2020-06-21 DIAGNOSIS — G3184 Mild cognitive impairment, so stated: Secondary | ICD-10-CM | POA: Diagnosis not present

## 2020-06-21 DIAGNOSIS — R42 Dizziness and giddiness: Secondary | ICD-10-CM | POA: Diagnosis not present

## 2020-06-21 DIAGNOSIS — E6609 Other obesity due to excess calories: Secondary | ICD-10-CM | POA: Diagnosis not present

## 2020-06-21 DIAGNOSIS — N1832 Chronic kidney disease, stage 3b: Secondary | ICD-10-CM | POA: Diagnosis not present

## 2020-06-21 DIAGNOSIS — H353130 Nonexudative age-related macular degeneration, bilateral, stage unspecified: Secondary | ICD-10-CM | POA: Diagnosis not present

## 2020-06-21 DIAGNOSIS — H3530 Unspecified macular degeneration: Secondary | ICD-10-CM | POA: Diagnosis not present

## 2020-07-01 ENCOUNTER — Other Ambulatory Visit: Payer: Self-pay | Admitting: Neurology

## 2020-07-11 ENCOUNTER — Ambulatory Visit (INDEPENDENT_AMBULATORY_CARE_PROVIDER_SITE_OTHER): Payer: Medicare Other | Admitting: Orthopedic Surgery

## 2020-07-11 ENCOUNTER — Other Ambulatory Visit: Payer: Self-pay

## 2020-07-11 ENCOUNTER — Encounter: Payer: Self-pay | Admitting: Orthopedic Surgery

## 2020-07-11 VITALS — BP 122/68 | HR 83 | Ht 62.0 in | Wt 211.0 lb

## 2020-07-11 DIAGNOSIS — M170 Bilateral primary osteoarthritis of knee: Secondary | ICD-10-CM | POA: Diagnosis not present

## 2020-07-11 NOTE — Patient Instructions (Signed)
Please call us if you notice any redness or swelling at the injection site.

## 2020-07-11 NOTE — Progress Notes (Signed)
New Patient Visit  Assessment: Tonya Perez is a 74 y.o. female with bilateral knee pain, right worse than left  Plan: I had extensive discussion with the patient and her husband in clinic today in regards to her current presentation.  She continues to have bilateral knee pain, but has responded well to injections in the past.  Most recent injections lasted for approximately 1 year.  As result, she is interested in obtaining repeat injections today.  I reviewed the x-rays with her in clinic today, and as these are only 64-year-old we did not repeat these x-rays.  She is also dealing with Parkinson's at this time, and per her husband she remains unstable.  As a result, I recommend against consideration for total knee arthroplasty at this time, but if her symptoms progress and her Parkinson's stabilizes, she may be a good candidate in the future.  All questions were answered she is amenable to plan.  She has elected to proceed with bilateral steroid injections.  Procedure note injection Left knee joint  Verbal consent was obtained to inject the left knee joint  Timeout was completed to confirm the site of injection.  The skin was prepped with alcohol and ethyl chloride was sprayed at the injection site.  A 21-gauge needle was used to inject 40 mg of Depo-Medrol and 1% lidocaine (3 cc) into the left knee using an anterolateral approach.  There were no complications. A sterile bandage was applied.   Procedure note injection Right knee joint  Verbal consent was obtained to inject the right knee joint  Timeout was completed to confirm the site of injection.  The skin was prepped with alcohol and ethyl chloride was sprayed at the injection site.  A 21-gauge needle was used to inject 40 mg of Depo-Medrol and 1% lidocaine (3 cc) into the left knee using an anterolateral approach.  There were no complications. A sterile bandage was applied.   Follow-up: PRN   Subjective:  Chief Complaint   Patient presents with  . Knee Pain    bilateral knee pain, last injection almost a year ago     HPI Tonya Perez is a 74 y.o. female who presents for evaluation of bilateral knee pain.  She was previously seen by my partner, Dr. Aline Brochure, approximately 1 year ago, at which time she received a bilateral knee steroid injections.  This provided excellent relief, however over the last few weeks, she has noticed progressive worsening in her knee pain.  Of note, she ambulates with the assistance of a walker, but also carries a recent diagnosis of Parkinson's, which her husband states is not yet stable.  The pain is primarily located in the medial aspect of bilateral knees.  She takes no medications for her knees occasionally.  Otherwise, she remains as active as possible.  No recent falls or injuries.  ROS  No numbness or tingling No chest pain No Difficulty breathing  Medical History:  Past Medical History:  Diagnosis Date  . Anxiety   . Depression   . Frequent falls   . High blood pressure   . Kidney disease   . Macular degeneration, dry   . Melanoma ([place])   . Obesity   . Osteoarthritis   . Resting tremor    left hand    Past Surgical History:  Procedure Laterality Date  . ABDOMINAL HYSTERECTOMY    . CHOLECYSTECTOMY      Family History  Problem Relation Age of Onset  . Other Mother  ruptured gall bladder  . Heart attack Father    Social History   Tobacco Use  . Smoking status: Never Smoker  . Smokeless tobacco: Never Used  Substance Use Topics  . Alcohol use: No  . Drug use: No    Allergies  Allergen Reactions  . Sinemet [Carbidopa W-Levodopa] Nausea And Vomiting  . Sulfa Antibiotics Swelling and Rash    Current Meds  Medication Sig  . ALPRAZolam (XANAX) 0.5 MG tablet Take 0.5 mg by mouth at bedtime.   Marland Kitchen buPROPion (WELLBUTRIN SR) 200 MG 12 hr tablet Take 400 mg by mouth daily.   . Cholecalciferol (VITAMIN D3 PO) Take 1,000 Units by mouth daily.  Marland Kitchen  imipramine (TOFRANIL) 25 MG tablet Take 25 mg by mouth daily.   Marland Kitchen lisinopril (PRINIVIL,ZESTRIL) 20 MG tablet Take 20 mg by mouth daily.   Marland Kitchen rOPINIRole (REQUIP) 1 MG tablet Take 1 tablet (1 mg total) by mouth 3 (three) times daily.    Objective: BP 122/68   Pulse 83   Ht 5\' 2"  (1.575 m)   Wt 95.7 kg   BMI 38.59 kg/m   Physical Exam  Ambulates with the assistance of a walker. She does have a noticeable left hand tremor  Alert and oriented, no acute distress. No increased work of breathing on room air.  Evaluation of the right knee demonstrates a mild varus alignment.  She does have tenderness to palpation the posterior aspect of the knee with an associated Baker's cyst.  Range of motion is full from 0 to 130 degrees.  Negative Lachman.  No increased instability varus valgus stress.  Tenderness palpation along the medial joint line.  Evaluation of the left knee demonstrates mild varus alignment.  She has some significant tenderness to palpation of posterior aspect of the knee with an associated Baker's cyst.  Range of motion from 0 to 130 degrees.  Negative Lachman.  No increased instability to varus and valgus stress.  Tenderness along the medial and lateral joint lines.    IMAGING: I personally reviewed the following images:  X-rays of bilateral knees demonstrates mild varus alignment overall.  She has moderate degenerative changes in the right knee including all 3 compartments.  There is also moderate degenerative changes in the left knee, including all 3 compartments.    No orders of the defined types were placed in this encounter.     [name], MD  07/11/2020 10:43 AM

## 2020-08-15 DIAGNOSIS — N1832 Chronic kidney disease, stage 3b: Secondary | ICD-10-CM | POA: Diagnosis not present

## 2020-08-22 DIAGNOSIS — I129 Hypertensive chronic kidney disease with stage 1 through stage 4 chronic kidney disease, or unspecified chronic kidney disease: Secondary | ICD-10-CM | POA: Diagnosis not present

## 2020-08-22 DIAGNOSIS — N1832 Chronic kidney disease, stage 3b: Secondary | ICD-10-CM | POA: Diagnosis not present

## 2020-08-22 DIAGNOSIS — G20 Parkinson's disease: Secondary | ICD-10-CM | POA: Diagnosis not present

## 2020-08-26 DIAGNOSIS — Z23 Encounter for immunization: Secondary | ICD-10-CM | POA: Diagnosis not present

## 2020-09-28 ENCOUNTER — Emergency Department (HOSPITAL_COMMUNITY): Payer: Medicare Other

## 2020-09-28 ENCOUNTER — Other Ambulatory Visit: Payer: Self-pay

## 2020-09-28 ENCOUNTER — Emergency Department (HOSPITAL_COMMUNITY)
Admission: EM | Admit: 2020-09-28 | Discharge: 2020-09-28 | Disposition: A | Discharge status: Home / Self Care | Payer: Medicare Other | Attending: Emergency Medicine | Admitting: Emergency Medicine

## 2020-09-28 ENCOUNTER — Encounter (HOSPITAL_COMMUNITY): Payer: Self-pay

## 2020-09-28 DIAGNOSIS — Z90710 Acquired absence of both cervix and uterus: Secondary | ICD-10-CM | POA: Diagnosis not present

## 2020-09-28 DIAGNOSIS — Y92009 Unspecified place in unspecified non-institutional (private) residence as the place of occurrence of the external cause: Secondary | ICD-10-CM | POA: Diagnosis not present

## 2020-09-28 DIAGNOSIS — Z4789 Encounter for other orthopedic aftercare: Secondary | ICD-10-CM | POA: Diagnosis not present

## 2020-09-28 DIAGNOSIS — G20 Parkinson's disease: Secondary | ICD-10-CM | POA: Insufficient documentation

## 2020-09-28 DIAGNOSIS — N189 Chronic kidney disease, unspecified: Secondary | ICD-10-CM | POA: Insufficient documentation

## 2020-09-28 DIAGNOSIS — S82141A Displaced bicondylar fracture of right tibia, initial encounter for closed fracture: Secondary | ICD-10-CM | POA: Insufficient documentation

## 2020-09-28 DIAGNOSIS — I129 Hypertensive chronic kidney disease with stage 1 through stage 4 chronic kidney disease, or unspecified chronic kidney disease: Secondary | ICD-10-CM | POA: Diagnosis not present

## 2020-09-28 DIAGNOSIS — S8251XA Displaced fracture of medial malleolus of right tibia, initial encounter for closed fracture: Secondary | ICD-10-CM | POA: Diagnosis not present

## 2020-09-28 DIAGNOSIS — T148XXA Other injury of unspecified body region, initial encounter: Secondary | ICD-10-CM

## 2020-09-28 DIAGNOSIS — S82831A Other fracture of upper and lower end of right fibula, initial encounter for closed fracture: Secondary | ICD-10-CM | POA: Diagnosis not present

## 2020-09-28 DIAGNOSIS — S82841A Displaced bimalleolar fracture of right lower leg, initial encounter for closed fracture: Secondary | ICD-10-CM | POA: Diagnosis not present

## 2020-09-28 DIAGNOSIS — W19XXXA Unspecified fall, initial encounter: Secondary | ICD-10-CM | POA: Diagnosis not present

## 2020-09-28 DIAGNOSIS — W101XXA Fall (on)(from) sidewalk curb, initial encounter: Secondary | ICD-10-CM | POA: Diagnosis not present

## 2020-09-28 DIAGNOSIS — Z79899 Other long term (current) drug therapy: Secondary | ICD-10-CM | POA: Insufficient documentation

## 2020-09-28 DIAGNOSIS — R52 Pain, unspecified: Secondary | ICD-10-CM | POA: Diagnosis not present

## 2020-09-28 DIAGNOSIS — S99911A Unspecified injury of right ankle, initial encounter: Secondary | ICD-10-CM | POA: Diagnosis present

## 2020-09-28 DIAGNOSIS — R0902 Hypoxemia: Secondary | ICD-10-CM | POA: Diagnosis not present

## 2020-09-28 DIAGNOSIS — I10 Essential (primary) hypertension: Secondary | ICD-10-CM | POA: Diagnosis not present

## 2020-09-28 MED ORDER — HYDROCODONE-ACETAMINOPHEN 5-325 MG PO TABS
2.0000 | ORAL_TABLET | Freq: Four times a day (QID) | ORAL | 0 refills | Status: DC | PRN
Start: 2020-09-28 — End: 2020-10-04

## 2020-09-28 MED ORDER — OXYCODONE-ACETAMINOPHEN 5-325 MG PO TABS
1.0000 | ORAL_TABLET | Freq: Once | ORAL | Status: AC
  Administered 2020-09-28: 1 via ORAL
  Filled 2020-09-28: qty 1

## 2020-09-28 MED ORDER — PROPOFOL 10 MG/ML IV BOLUS
INTRAVENOUS | Status: AC | PRN
  Administered 2020-09-28: 30 mg via INTRAVENOUS
  Administered 2020-09-28: 60 mg via INTRAVENOUS

## 2020-09-28 MED ORDER — FENTANYL CITRATE (PF) 100 MCG/2ML IJ SOLN
100.0000 ug | Freq: Once | INTRAMUSCULAR | Status: DC
  Filled 2020-09-28: qty 2

## 2020-09-28 MED ORDER — PROPOFOL 10 MG/ML IV BOLUS
0.5000 mg/kg | Freq: Once | INTRAVENOUS | Status: DC
  Filled 2020-09-28: qty 20

## 2020-09-28 NOTE — Progress Notes (Signed)
Procedure complete, vitals remained stable, RN at bedside.

## 2020-09-28 NOTE — Progress Notes (Addendum)
Patient placed on [place] 2L with ETCO2 monitoring for a conscious sedation procedure.  Suction, ambu bag, and airway cart on standby.

## 2020-09-28 NOTE — ED Provider Notes (Signed)
.  Sedation  Date/Time: 09/28/2020 4:22 PM Performed by: Maudie Flakes, MD Authorized by: Maudie Flakes, MD   Consent:    Consent obtained:  Verbal and written   Consent given by:  Patient   Risks discussed:  Allergic reaction, dysrhythmia, inadequate sedation, nausea, prolonged hypoxia resulting in organ damage, vomiting and respiratory compromise necessitating ventilatory assistance and intubation Universal protocol:    Immediately prior to procedure a time out was called: yes     Patient identity confirmation method:  Arm band, hospital-assigned identification number, verbally with patient and provided demographic data Indications:    Procedure performed:  Fracture reduction   Procedure necessitating sedation performed by:  Physician performing sedation Pre-sedation assessment:    Time since last food or drink:  4 hours   ASA classification: class 1 - normal, healthy patient     Neck mobility: normal     Mouth opening:  3 or more finger widths   Mallampati score:  I - soft palate, uvula, fauces, pillars visible   Pre-sedation assessments completed and reviewed: airway patency, cardiovascular function, hydration status, mental status, nausea/vomiting, pain level, respiratory function and temperature   Immediate pre-procedure details:    Reassessment: Patient reassessed immediately prior to procedure     Reviewed: vital signs, relevant labs/tests and NPO status     Verified: bag valve mask available, emergency equipment available, intubation equipment available, IV patency confirmed, oxygen available and suction available   Procedure details (see MAR for exact dosages):    Preoxygenation:  Nasal cannula   Sedation:  Propofol   Intended level of sedation: deep   Analgesia:  Fentanyl   Intra-procedure monitoring:  Blood pressure monitoring, cardiac monitor, continuous pulse oximetry, continuous capnometry, frequent LOC assessments and frequent vital sign checks   Intra-procedure  events: none     Total Provider sedation time (minutes):  20 Post-procedure details:    Attendance: Constant attendance by certified staff until patient recovered     Recovery: Patient returned to pre-procedure baseline     Post-sedation assessments completed and reviewed: airway patency, cardiovascular function, hydration status, mental status, nausea/vomiting, pain level, respiratory function and temperature     Patient is stable for discharge or admission: yes     Patient tolerance:  Tolerated well, no immediate complications      Maudie Flakes, MD 09/28/20 1626

## 2020-09-28 NOTE — Care Management (Signed)
    Durable Medical Equipment  (From admission, onward)         Start     Ordered   09/28/20 1705  For home use only DME wheelchair cushion (seat and back)  Once        09/28/20 1704

## 2020-09-28 NOTE — Progress Notes (Signed)
   09/28/20 1913  TOC ED Mini Assessment  TOC Time spent with patient (minutes): 45  PING Used in TOC Assessment No  Admission or Readmission Diverted Yes  Interventions which prevented an admission or readmission  (DME needed  no wheelcair available to provide from the ED)  What brought you to the Emergency Department?  fractured ankle  Barriers to Discharge ED DME delivery (CM uanble to find DME w/c for discharge from the ED will have to order to deliver to patient's home.)  ED CM met with patient and husband at bedside to discuss recommendations for DME w/c.  Patient was diagnosed with a fracture left ankle with no weight bearing. Patient and spouse are agreeable with the recommendation. CM discussed DME with [employer] explained that we can get order and deliver w/c to ED prior to discharge. CM noted no w/c in the [employer] storeroom. Patient lives in [place] CM faxed in order toe [place] which is near patient's home. W/C will be delivered tomorrow patient and husband are agreeable. Patient will be transported home via private vehicle to follow up with ortho tomorrow. Updated EDP and ED nursing staff

## 2020-09-28 NOTE — ED Provider Notes (Signed)
[facility] EMERGENCY DEPARTMENT Provider Note   CSN: [id number] Arrival date & time: 09/28/20  1405     History Chief Complaint  Patient presents with  . Ankle Injury    Tonya Perez is a 74 y.o. female.  The history is provided by the patient. No language interpreter was used.  Ankle Injury     74 year old female significant history of macular degeneration, obesity, osteoarthritis, frequent falls, Parkinson disease brought here via EMS from home for evaluation of an ankle injury.  Patient reports prior to arrival she was trying to get into her call when she accidentally twisted her right ankle and felt acute onset of sharp pain.  She nearly fell to the ground.  She did not feel anything pop but unable to bear any weight.  Pain is nonradiating did improve with 100 mcg of fentanyl given by EMS on route.  She denies any numbness no knee or hip pain no other injury.  Past Medical History:  Diagnosis Date  . Anxiety   . Depression   . Frequent falls   . High blood pressure   . Kidney disease   . Macular degeneration, dry   . Melanoma ([place])   . Obesity   . Osteoarthritis   . Resting tremor    left hand    Patient Active Problem List   Diagnosis Date Noted  . Parkinsonism ([place]) 04/27/2019  . Compression fracture of L1 lumbar vertebra ([place]) 04/27/2019  . Tremor 02/19/2019  . Closed nondisplaced fracture of head of right radius 10/18/2016    Past Surgical History:  Procedure Laterality Date  . ABDOMINAL HYSTERECTOMY    . CHOLECYSTECTOMY       OB History   No obstetric history on file.     Family History  Problem Relation Age of Onset  . Other Mother        ruptured gall bladder  . Heart attack Father     Social History   Tobacco Use  . Smoking status: Never Smoker  . Smokeless tobacco: Never Used  Substance Use Topics  . Alcohol use: No  . Drug use: No    Home Medications Prior to Admission medications   Medication Sig Start Date  End Date Taking? Authorizing Provider  ALPRAZolam Duanne Moron) 0.5 MG tablet Take 0.5 mg by mouth at bedtime.  08/24/16   [provider]  buPROPion (WELLBUTRIN SR) 200 MG 12 hr tablet Take 400 mg by mouth daily.  08/31/16   [provider]  Cholecalciferol (VITAMIN D3 PO) Take 1,000 Units by mouth daily.    [provider]  imipramine (TOFRANIL) 25 MG tablet Take 25 mg by mouth daily.  08/23/16   [provider]  lisinopril (PRINIVIL,ZESTRIL) 20 MG tablet Take 20 mg by mouth daily.  08/31/16   [provider]  rOPINIRole (REQUIP) 1 MG tablet Take 1 tablet (1 mg total) by mouth 3 (three) times daily. 09/08/19   Marcial Pacas, MD    Allergies    Sinemet Theresa Duty w-levodopa] and Sulfa antibiotics  Review of Systems   Review of Systems  Constitutional: Negative for fever.  Skin: Negative for wound.  Neurological: Negative for numbness.    Physical Exam Updated Vital Signs BP 128/82   Pulse 71   Temp 97.6 F (36.4 C) (Oral)   Resp 16   Ht 5\' 2"  (1.575 m)   Wt 90.7 kg   SpO2 95%   BMI 36.58 kg/m   Physical Exam Vitals and  nursing note reviewed.  Constitutional:      General: She is not in acute distress.    Appearance: She is well-developed.  HENT:     Head: Atraumatic.  Eyes:     Conjunctiva/sclera: Conjunctivae normal.  Musculoskeletal:        General: Signs of injury (R ankle: tenderness to medial and lateral malleolar region with edema present, with crepitus and medial skin tenting.  DP pulse palpable, no tenderness to 5th MTP.) present.     Cervical back: Neck supple.  Skin:    Findings: No rash.  Neurological:     Mental Status: She is alert.     ED Results / Procedures / Treatments   Labs (all labs ordered are listed, but only abnormal results are displayed) Labs Reviewed - No data to display  EKG None  Radiology DG Ankle Complete Right  Result Date: 09/28/2020 CLINICAL DATA:  Injury with pain and deformity. EXAM:  RIGHT ANKLE - COMPLETE 3+ VIEW COMPARISON:  None. FINDINGS: Fracture subluxation at the ankle joint. Oblique fracture of the distal fibula. Transverse fracture of the medial malleolus. Posterior lip of the tibia appears to be intact. Tibia subluxed medially relative to the talar dome. No talar fracture identified. Midfoot appears intact as seen. IMPRESSION: Fracture subluxation at the ankle joint. Oblique fracture of the distal fibula. Transverse fracture of the medial malleolus. Ankle joint subluxation. Electronically Signed   By: Nelson Chimes M.D.   On: 09/28/2020 15:12    Procedures .Ortho Injury Treatment  Date/Time: 09/28/2020 4:23 PM Performed by: Domenic Moras, PA-C Authorized by: Domenic Moras, PA-C   Consent:    Consent obtained:  Verbal and written   Consent given by:  Patient   Risks discussed:  Fracture, irreducible dislocation, vascular damage and nerve damage   Alternatives discussed:  Alternative treatmentInjury location: ankle Location details: right ankle Injury type: fracture-dislocation Fracture type: bimalleolar Pre-procedure neurovascular assessment: neurovascularly intact Pre-procedure distal perfusion: normal Pre-procedure neurological function: normal Pre-procedure range of motion: reduced  Anesthesia: Local anesthesia used: no  Patient sedated: Yes. Refer to sedation procedure documentation for details of sedation. Manipulation performed: yes Skin traction used: no Skeletal traction used: no Reduction successful: yes X-ray confirmed reduction: yes Immobilization: splint Splint type: short leg Supplies used: Ortho-Glass Post-procedure neurovascular assessment: post-procedure neurovascularly intact Post-procedure distal perfusion: normal Post-procedure neurological function: normal Post-procedure range of motion: improved Patient tolerance: patient tolerated the procedure well with no immediate complications    (including critical care time)  Medications  Ordered in ED Medications  propofol (DIPRIVAN) 10 mg/mL bolus/IV push 45.4 mg (has no administration in time range)  fentaNYL (SUBLIMAZE) injection 100 mcg (has no administration in time range)  propofol (DIPRIVAN) 10 mg/mL bolus/IV push (30 mg Intravenous Given 09/28/20 1612)    ED Course  I have reviewed the triage vital signs and the nursing notes.  Pertinent labs & imaging results that were available during my care of the patient were reviewed by me and considered in my medical decision making (see chart for details).    MDM Rules/Calculators/A&P                          BP 120/77 (BP Location: Left Arm)   Pulse 78   Temp 98.2 F (36.8 C) (Oral)   Resp (!) 28   Ht 5\' 2"  (1.575 m)   Wt 90.7 kg   SpO2 97%   BMI 36.58 kg/m   Final Clinical Impression(s) /  ED Diagnoses Final diagnoses:  Bimalleolar fracture of right ankle, closed, initial encounter    Rx / DC Orders ED Discharge Orders         Ordered    HYDROcodone-acetaminophen (NORCO/VICODIN) 5-325 MG tablet  Every 6 hours PRN        09/28/20 1658         2:24 PM Patient here with a mechanical right ankle injury concerning for fracture.  Examination reveals deformity about the right ankle with skin tenting to the medial aspect of the malleolus.  X-ray obtained demonstrate fracture subluxation of the ankle joint.  Oblique fracture of the distal fibula.  Transverse fracture of the medial malleolus, ankle joint subluxation.  I discussed this with Dr. Sedonia Small, and also discussed the finding with the patient.  Our plan is to perform procedural sedation and reduction of right ankle.  Patient last ate yesterday.  She voiced understanding and agrees with plan.  4:25 PM Procedure sedation done by Dr. Sedonia Small, I was able to perform ankle reduction under procedural sedation without any adverse event.  Postreduction film ordered.  Orthopedics, Dr. Doreatha Martin will see pt outpt and likely perform ORIF .  We have also request case manager to  help patient with mobility at home as she is not able to use crutches for ambulation.  Wheel chair ordered   Domenic Moras, PA-C 09/28/20 1828    Maudie Flakes, MD 09/29/20 [phone]

## 2020-09-28 NOTE — ED Triage Notes (Signed)
Pt BIB [facility] after stepping off a curb and rolling her ankle. Pt did not fall husband was able to catch her. Pt has obvious deformity to right ankle. EMS gave 100 mcg of Fentanyl.

## 2020-09-28 NOTE — Discharge Instructions (Addendum)
You have been evaluated for your fall.  You have injured your right ankle that was reduced in the ED.  You will need to follow-up closely with orthopedist specialist in the next few days and would like to be need surgical intervention.  Do not bear any weight on the affected ankle.  Use wheelchair as directed.  Take pain medication as needed but be aware that it may cause drowsiness.

## 2020-09-28 NOTE — Progress Notes (Signed)
Orthopedic Tech Progress Note Patient Details:  Tonya Perez August 23, 1946 [id number] MD did a reduction of the ankle.. patient's husband stated she has PARKINSON'S and she would not do well with CRUTCHES. I told MD and he was ok with no giving them to her Ortho Devices Type of Ortho Device: Stirrup splint, Short leg splint Ortho Device/Splint Location: RLE Ortho Device/Splint Interventions: Ordered, Application, Adjustment   Post Interventions Patient Tolerated: Well Instructions Provided: Care of [place] 09/28/2020, 4:30 PM

## 2020-09-30 DIAGNOSIS — S82841S Displaced bimalleolar fracture of right lower leg, sequela: Secondary | ICD-10-CM | POA: Diagnosis not present

## 2020-09-30 DIAGNOSIS — Z7409 Other reduced mobility: Secondary | ICD-10-CM | POA: Diagnosis not present

## 2020-10-01 ENCOUNTER — Emergency Department (HOSPITAL_COMMUNITY): Payer: Medicare Other

## 2020-10-01 ENCOUNTER — Other Ambulatory Visit: Payer: Self-pay

## 2020-10-01 ENCOUNTER — Inpatient Hospital Stay (HOSPITAL_COMMUNITY)
Admission: EM | Admit: 2020-10-01 | Discharge: 2020-10-04 | DRG: 696 | Disposition: A | Discharge status: Skilled Nursing Facility | Payer: Medicare Other | Attending: Family Medicine | Admitting: Family Medicine

## 2020-10-01 ENCOUNTER — Other Ambulatory Visit (HOSPITAL_COMMUNITY): Payer: Self-pay | Admitting: Radiology

## 2020-10-01 ENCOUNTER — Encounter (HOSPITAL_COMMUNITY): Payer: Self-pay | Admitting: *Deleted

## 2020-10-01 DIAGNOSIS — S82401S Unspecified fracture of shaft of right fibula, sequela: Secondary | ICD-10-CM

## 2020-10-01 DIAGNOSIS — D539 Nutritional anemia, unspecified: Secondary | ICD-10-CM | POA: Diagnosis present

## 2020-10-01 DIAGNOSIS — R296 Repeated falls: Secondary | ICD-10-CM | POA: Diagnosis present

## 2020-10-01 DIAGNOSIS — Z90710 Acquired absence of both cervix and uterus: Secondary | ICD-10-CM | POA: Diagnosis not present

## 2020-10-01 DIAGNOSIS — Z79899 Other long term (current) drug therapy: Secondary | ICD-10-CM

## 2020-10-01 DIAGNOSIS — R41841 Cognitive communication deficit: Secondary | ICD-10-CM | POA: Diagnosis present

## 2020-10-01 DIAGNOSIS — R410 Disorientation, unspecified: Secondary | ICD-10-CM

## 2020-10-01 DIAGNOSIS — R251 Tremor, unspecified: Secondary | ICD-10-CM | POA: Diagnosis present

## 2020-10-01 DIAGNOSIS — S82201A Unspecified fracture of shaft of right tibia, initial encounter for closed fracture: Secondary | ICD-10-CM | POA: Diagnosis present

## 2020-10-01 DIAGNOSIS — F339 Major depressive disorder, recurrent, unspecified: Secondary | ICD-10-CM | POA: Diagnosis present

## 2020-10-01 DIAGNOSIS — S8251XA Displaced fracture of medial malleolus of right tibia, initial encounter for closed fracture: Secondary | ICD-10-CM | POA: Diagnosis not present

## 2020-10-01 DIAGNOSIS — I10 Essential (primary) hypertension: Secondary | ICD-10-CM | POA: Diagnosis present

## 2020-10-01 DIAGNOSIS — Z7409 Other reduced mobility: Secondary | ICD-10-CM | POA: Diagnosis not present

## 2020-10-01 DIAGNOSIS — Z8249 Family history of ischemic heart disease and other diseases of the circulatory system: Secondary | ICD-10-CM

## 2020-10-01 DIAGNOSIS — M1990 Unspecified osteoarthritis, unspecified site: Secondary | ICD-10-CM | POA: Diagnosis present

## 2020-10-01 DIAGNOSIS — S8261XA Displaced fracture of lateral malleolus of right fibula, initial encounter for closed fracture: Secondary | ICD-10-CM | POA: Diagnosis not present

## 2020-10-01 DIAGNOSIS — H3530 Unspecified macular degeneration: Secondary | ICD-10-CM | POA: Diagnosis present

## 2020-10-01 DIAGNOSIS — R338 Other retention of urine: Secondary | ICD-10-CM | POA: Diagnosis not present

## 2020-10-01 DIAGNOSIS — S82401A Unspecified fracture of shaft of right fibula, initial encounter for closed fracture: Secondary | ICD-10-CM | POA: Diagnosis present

## 2020-10-01 DIAGNOSIS — S82841S Displaced bimalleolar fracture of right lower leg, sequela: Secondary | ICD-10-CM | POA: Diagnosis not present

## 2020-10-01 DIAGNOSIS — N1832 Chronic kidney disease, stage 3b: Secondary | ICD-10-CM | POA: Diagnosis not present

## 2020-10-01 DIAGNOSIS — S82201D Unspecified fracture of shaft of right tibia, subsequent encounter for closed fracture with routine healing: Secondary | ICD-10-CM | POA: Diagnosis not present

## 2020-10-01 DIAGNOSIS — R5381 Other malaise: Secondary | ICD-10-CM | POA: Diagnosis present

## 2020-10-01 DIAGNOSIS — F419 Anxiety disorder, unspecified: Secondary | ICD-10-CM | POA: Diagnosis not present

## 2020-10-01 DIAGNOSIS — D631 Anemia in chronic kidney disease: Secondary | ICD-10-CM | POA: Diagnosis present

## 2020-10-01 DIAGNOSIS — R339 Retention of urine, unspecified: Secondary | ICD-10-CM | POA: Diagnosis present

## 2020-10-01 DIAGNOSIS — S9301XA Subluxation of right ankle joint, initial encounter: Secondary | ICD-10-CM | POA: Diagnosis not present

## 2020-10-01 DIAGNOSIS — N179 Acute kidney failure, unspecified: Secondary | ICD-10-CM | POA: Diagnosis not present

## 2020-10-01 DIAGNOSIS — I129 Hypertensive chronic kidney disease with stage 1 through stage 4 chronic kidney disease, or unspecified chronic kidney disease: Secondary | ICD-10-CM | POA: Diagnosis present

## 2020-10-01 DIAGNOSIS — R69 Illness, unspecified: Secondary | ICD-10-CM | POA: Diagnosis not present

## 2020-10-01 DIAGNOSIS — Z882 Allergy status to sulfonamides status: Secondary | ICD-10-CM | POA: Diagnosis not present

## 2020-10-01 DIAGNOSIS — S82851A Displaced trimalleolar fracture of right lower leg, initial encounter for closed fracture: Secondary | ICD-10-CM | POA: Diagnosis not present

## 2020-10-01 DIAGNOSIS — Z85820 Personal history of malignant melanoma of skin: Secondary | ICD-10-CM | POA: Diagnosis not present

## 2020-10-01 DIAGNOSIS — R279 Unspecified lack of coordination: Secondary | ICD-10-CM | POA: Diagnosis not present

## 2020-10-01 DIAGNOSIS — Z20822 Contact with and (suspected) exposure to covid-19: Secondary | ICD-10-CM | POA: Diagnosis present

## 2020-10-01 DIAGNOSIS — M6281 Muscle weakness (generalized): Secondary | ICD-10-CM | POA: Diagnosis present

## 2020-10-01 DIAGNOSIS — H353190 Nonexudative age-related macular degeneration, unspecified eye, stage unspecified: Secondary | ICD-10-CM | POA: Diagnosis present

## 2020-10-01 DIAGNOSIS — S82841D Displaced bimalleolar fracture of right lower leg, subsequent encounter for closed fracture with routine healing: Secondary | ICD-10-CM | POA: Diagnosis not present

## 2020-10-01 DIAGNOSIS — E669 Obesity, unspecified: Secondary | ICD-10-CM | POA: Diagnosis present

## 2020-10-01 DIAGNOSIS — Z888 Allergy status to other drugs, medicaments and biological substances status: Secondary | ICD-10-CM | POA: Diagnosis not present

## 2020-10-01 DIAGNOSIS — Z7401 Bed confinement status: Secondary | ICD-10-CM | POA: Diagnosis not present

## 2020-10-01 DIAGNOSIS — Z993 Dependence on wheelchair: Secondary | ICD-10-CM | POA: Diagnosis not present

## 2020-10-01 DIAGNOSIS — Z9049 Acquired absence of other specified parts of digestive tract: Secondary | ICD-10-CM | POA: Diagnosis not present

## 2020-10-01 DIAGNOSIS — S82401D Unspecified fracture of shaft of right fibula, subsequent encounter for closed fracture with routine healing: Secondary | ICD-10-CM | POA: Diagnosis not present

## 2020-10-01 DIAGNOSIS — R627 Adult failure to thrive: Secondary | ICD-10-CM | POA: Diagnosis not present

## 2020-10-01 DIAGNOSIS — R32 Unspecified urinary incontinence: Secondary | ICD-10-CM | POA: Diagnosis present

## 2020-10-01 DIAGNOSIS — Z9181 History of falling: Secondary | ICD-10-CM | POA: Diagnosis not present

## 2020-10-01 DIAGNOSIS — Z6835 Body mass index (BMI) 35.0-35.9, adult: Secondary | ICD-10-CM | POA: Diagnosis not present

## 2020-10-01 DIAGNOSIS — W19XXXD Unspecified fall, subsequent encounter: Secondary | ICD-10-CM | POA: Diagnosis not present

## 2020-10-01 DIAGNOSIS — F32A Depression, unspecified: Secondary | ICD-10-CM | POA: Diagnosis present

## 2020-10-01 DIAGNOSIS — X501XXA Overexertion from prolonged static or awkward postures, initial encounter: Secondary | ICD-10-CM | POA: Diagnosis not present

## 2020-10-01 DIAGNOSIS — Z01818 Encounter for other preprocedural examination: Secondary | ICD-10-CM | POA: Diagnosis not present

## 2020-10-01 DIAGNOSIS — W19XXXA Unspecified fall, initial encounter: Secondary | ICD-10-CM | POA: Diagnosis present

## 2020-10-01 DIAGNOSIS — G20 Parkinson's disease: Secondary | ICD-10-CM | POA: Diagnosis present

## 2020-10-01 DIAGNOSIS — C439 Malignant melanoma of skin, unspecified: Secondary | ICD-10-CM | POA: Diagnosis not present

## 2020-10-01 DIAGNOSIS — R9431 Abnormal electrocardiogram [ECG] [EKG]: Secondary | ICD-10-CM | POA: Diagnosis not present

## 2020-10-01 DIAGNOSIS — I959 Hypotension, unspecified: Secondary | ICD-10-CM | POA: Diagnosis not present

## 2020-10-01 DIAGNOSIS — S82201S Unspecified fracture of shaft of right tibia, sequela: Secondary | ICD-10-CM | POA: Diagnosis not present

## 2020-10-01 DIAGNOSIS — R2689 Other abnormalities of gait and mobility: Secondary | ICD-10-CM | POA: Diagnosis present

## 2020-10-01 DIAGNOSIS — E860 Dehydration: Secondary | ICD-10-CM | POA: Diagnosis present

## 2020-10-01 LAB — URINALYSIS, ROUTINE W REFLEX MICROSCOPIC
Bilirubin Urine: NEGATIVE
Glucose, UA: NEGATIVE mg/dL
Hgb urine dipstick: NEGATIVE
Ketones, ur: 5 mg/dL — AB
Leukocytes,Ua: NEGATIVE
Nitrite: NEGATIVE
Protein, ur: 30 mg/dL — AB
Specific Gravity, Urine: 1.023 (ref 1.005–1.030)
pH: 5 (ref 5.0–8.0)

## 2020-10-01 LAB — CBC WITH DIFFERENTIAL/PLATELET
Abs Immature Granulocytes: 0.02 10*3/uL (ref 0.00–0.07)
Basophils Absolute: 0 10*3/uL (ref 0.0–0.1)
Basophils Relative: 1 %
Eosinophils Absolute: 0.1 10*3/uL (ref 0.0–0.5)
Eosinophils Relative: 1 %
HCT: 34.3 % — ABNORMAL LOW (ref 36.0–46.0)
Hemoglobin: 10.8 g/dL — ABNORMAL LOW (ref 12.0–15.0)
Immature Granulocytes: 0 %
Lymphocytes Relative: 15 %
Lymphs Abs: 0.9 10*3/uL (ref 0.7–4.0)
MCH: 33.4 pg (ref 26.0–34.0)
MCHC: 31.5 g/dL (ref 30.0–36.0)
MCV: 106.2 fL — ABNORMAL HIGH (ref 80.0–100.0)
Monocytes Absolute: 0.7 10*3/uL (ref 0.1–1.0)
Monocytes Relative: 12 %
Neutro Abs: 4.4 10*3/uL (ref 1.7–7.7)
Neutrophils Relative %: 71 %
Platelets: 180 10*3/uL (ref 150–400)
RBC: 3.23 MIL/uL — ABNORMAL LOW (ref 3.87–5.11)
RDW: 15.1 % (ref 11.5–15.5)
WBC: 6 10*3/uL (ref 4.0–10.5)
nRBC: 0 % (ref 0.0–0.2)

## 2020-10-01 LAB — RETICULOCYTES
Immature Retic Fract: 15.7 % (ref 2.3–15.9)
RBC.: 3.25 MIL/uL — ABNORMAL LOW (ref 3.87–5.11)
Retic Count, Absolute: 54 10*3/uL (ref 19.0–186.0)
Retic Ct Pct: 1.7 % (ref 0.4–3.1)

## 2020-10-01 LAB — BASIC METABOLIC PANEL
Anion gap: 8 (ref 5–15)
BUN: 36 mg/dL — ABNORMAL HIGH (ref 8–23)
CO2: 23 mmol/L (ref 22–32)
Calcium: 8.6 mg/dL — ABNORMAL LOW (ref 8.9–10.3)
Chloride: 107 mmol/L (ref 98–111)
Creatinine, Ser: 2.22 mg/dL — ABNORMAL HIGH (ref 0.44–1.00)
GFR, Estimated: 23 mL/min — ABNORMAL LOW (ref >60)
Glucose, Bld: 113 mg/dL — ABNORMAL HIGH (ref 70–99)
Potassium: 4.2 mmol/L (ref 3.5–5.1)
Sodium: 138 mmol/L (ref 135–145)

## 2020-10-01 LAB — FERRITIN: Ferritin: 131 ng/mL (ref 11–307)

## 2020-10-01 LAB — RESP PANEL BY RT-PCR (FLU A&B, COVID) ARPGX2
Influenza A by PCR: NEGATIVE
Influenza B by PCR: NEGATIVE
SARS Coronavirus 2 by RT PCR: NEGATIVE

## 2020-10-01 LAB — IRON AND TIBC
Iron: 39 ug/dL (ref 28–170)
Saturation Ratios: 20 % (ref 10.4–31.8)
TIBC: 198 ug/dL — ABNORMAL LOW (ref 250–450)
UIBC: 159 ug/dL

## 2020-10-01 LAB — VITAMIN B12: Vitamin B-12: 95 pg/mL — ABNORMAL LOW (ref 180–914)

## 2020-10-01 LAB — FOLATE: Folate: 18 ng/mL (ref >5.9)

## 2020-10-01 MED ORDER — BUPROPION HCL ER (SR) 150 MG PO TB12
400.0000 mg | ORAL_TABLET | Freq: Every day | ORAL | Status: DC
  Administered 2020-10-02 – 2020-10-04 (×3): 400 mg via ORAL
  Filled 2020-10-01 (×3): qty 2

## 2020-10-01 MED ORDER — SODIUM CHLORIDE 0.9 % IV SOLN: Freq: Once | INTRAVENOUS | Status: DC

## 2020-10-01 MED ORDER — IMIPRAMINE HCL 25 MG PO TABS
25.0000 mg | ORAL_TABLET | Freq: Every day | ORAL | Status: DC
  Administered 2020-10-01 – 2020-10-03 (×3): 25 mg via ORAL
  Filled 2020-10-01 (×3): qty 1

## 2020-10-01 MED ORDER — ACETAMINOPHEN 325 MG PO TABS: 650.0000 mg | ORAL_TABLET | Freq: Four times a day (QID) | ORAL | Status: DC | PRN

## 2020-10-01 MED ORDER — OCUVITE-LUTEIN PO CAPS
1.0000 | ORAL_CAPSULE | Freq: Every day | ORAL | Status: DC
  Administered 2020-10-02 – 2020-10-04 (×3): 1 via ORAL
  Filled 2020-10-01 (×3): qty 1

## 2020-10-01 MED ORDER — CARBIDOPA-LEVODOPA ER 25-100 MG PO TBCR
1.0000 | EXTENDED_RELEASE_TABLET | Freq: Three times a day (TID) | ORAL | Status: DC
  Administered 2020-10-01 – 2020-10-04 (×9): 1 via ORAL
  Filled 2020-10-01 (×9): qty 1

## 2020-10-01 MED ORDER — HEPARIN SODIUM (PORCINE) 5000 UNIT/ML IJ SOLN
5000.0000 [IU] | Freq: Three times a day (TID) | INTRAMUSCULAR | Status: DC
  Administered 2020-10-01 – 2020-10-04 (×8): 5000 [IU] via SUBCUTANEOUS
  Filled 2020-10-01 (×8): qty 1

## 2020-10-01 MED ORDER — HYDROCODONE-ACETAMINOPHEN 5-325 MG PO TABS: 2.0000 | ORAL_TABLET | Freq: Four times a day (QID) | ORAL | Status: DC | PRN

## 2020-10-01 MED ORDER — ARIPIPRAZOLE 5 MG PO TABS
5.0000 mg | ORAL_TABLET | Freq: Every day | ORAL | Status: DC
  Administered 2020-10-02 – 2020-10-04 (×3): 5 mg via ORAL
  Filled 2020-10-01 (×3): qty 1

## 2020-10-01 MED ORDER — ROPINIROLE HCL 1 MG PO TABS
1.0000 mg | ORAL_TABLET | Freq: Three times a day (TID) | ORAL | Status: DC
  Administered 2020-10-01 – 2020-10-04 (×9): 1 mg via ORAL
  Filled 2020-10-01 (×9): qty 1

## 2020-10-01 MED ORDER — VITAMIN D 25 MCG (1000 UNIT) PO TABS
1000.0000 [IU] | ORAL_TABLET | Freq: Every day | ORAL | Status: DC
  Administered 2020-10-02 – 2020-10-04 (×3): 1000 [IU] via ORAL
  Filled 2020-10-01 (×3): qty 1

## 2020-10-01 MED ORDER — LISINOPRIL 10 MG PO TABS
10.0000 mg | ORAL_TABLET | Freq: Every day | ORAL | Status: DC
  Administered 2020-10-02 – 2020-10-04 (×3): 10 mg via ORAL
  Filled 2020-10-01 (×3): qty 1

## 2020-10-01 MED ORDER — ALPRAZOLAM 0.5 MG PO TABS
0.5000 mg | ORAL_TABLET | Freq: Every day | ORAL | Status: DC
  Administered 2020-10-01 – 2020-10-03 (×3): 0.5 mg via ORAL
  Filled 2020-10-01 (×3): qty 1

## 2020-10-01 NOTE — ED Notes (Signed)
rm changed from 305 to 329 and 329 has facility hold on room   Awaiting room assignment and ready bed  Meal provided to pt

## 2020-10-01 NOTE — ED Notes (Signed)
Pt incontinent of urine with return of 400 cc urine

## 2020-10-01 NOTE — ED Notes (Signed)
Pt with a fractured R ankle   Casted   Spouse reports he is caring for her but cannot say how much fluid she is receiving daily   Her pants are partially down and smell of urine   Sent here by home health after spouse reports no voiding save 4 times since [facility]

## 2020-10-01 NOTE — ED Triage Notes (Signed)
Pt's family member states pt has voided x 4 since Wednesday.  ?infection.  Home health nurse seen pt this morning and instructed pt to go to ED.

## 2020-10-01 NOTE — H&P (Signed)
TRH H&P   Patient Demographics:    Tonya Perez, is a 74 y.o. female  MRN: [id number]   DOB - [birth year]  Admit Date - 10/01/2020  Outpatient Primary MD for the patient is Celene Squibb, MD  Referring MD/NP/PA: Dr Sabra Heck    Patient coming from: Home  Chief Complaint  Patient presents with  . Urinary Retention      HPI:    Tonya Perez  is a 74 y.o. female, medical history of Parkinson disease, obesity, anxiety and depression, patient lives at home with her husband, usually she is requiring walker or cane for ambulation, patient with accidental fall last Wednesday, 09/28/2020, for which she sustained left ankle fracture, and reduction in ED, and she was discharged home with outpatient follow-up with orthopedic Dr. Aline Brochure for this coming Thursday, per husband patient has been bedbound since, was not able to stand up or to ambulate, for last several days, he reports she is too large and too difficult to ambulate, he does report she was altered and confused yesterday, as well reports she has some urinary incontinence and difficulty urinating as well, PT has been arranged by her PCP as an outpatient, physical therapy went to evaluate her today, was turned about her and directed her to come to ED. -in ED mentation at baseline, creatinine at 2.2 which is around her baseline, she had urinary retention required in and out with 400 cc output,[facility] hospitalist consulted to admit as patient is unsafe for discharge home.   Review of systems:    In addition to the HPI above, No Fever-chills, she has progressive weakness, and deconditioning No Headache, No changes with Vision or hearing, No problems swallowing food or Liquids, No Chest pain, Cough or Shortness of Breath, No Abdominal pain, No Nausea or Vommitting, Bowel movements are regular, No Blood in stool or Urine, Had urinary retention No new  skin rashes or bruises, No new joints pains-aches,  No new weakness, tingling, numbness in any extremity, No recent weight gain or loss, No polyuria, polydypsia or polyphagia, No significant Mental Stressors.  A full 10 point Review of Systems was done, except as stated above, all other Review of Systems were negative.   With Past History of the following :    Past Medical History:  Diagnosis Date  . Anxiety   . Depression   . Frequent falls   . High blood pressure   . Kidney disease   . Macular degeneration, dry   . Melanoma ([place])   . Obesity   . Osteoarthritis   . Resting tremor    left hand      Past Surgical History:  Procedure Laterality Date  . ABDOMINAL HYSTERECTOMY    . CHOLECYSTECTOMY        Social History:     Social History   Tobacco Use  . Smoking status: Never Smoker  . Smokeless  tobacco: Never Used  Substance Use Topics  . Alcohol use: No       Family History :     Family History  Problem Relation Age of Onset  . Other Mother        ruptured gall bladder  . Heart attack Father       Home Medications:   Prior to Admission medications   Medication Sig Start Date End Date Taking? Authorizing Provider  acetaminophen (TYLENOL) 325 MG tablet Take 650 mg by mouth every 6 (six) hours as needed.   Yes [provider]  ALPRAZolam Duanne Moron) 0.5 MG tablet Take 0.5 mg by mouth at bedtime.  08/24/16  Yes [provider]  ARIPiprazole (ABILIFY) 5 MG tablet Take 5 mg by mouth daily.   Yes [provider]  buPROPion (WELLBUTRIN SR) 200 MG 12 hr tablet Take 400 mg by mouth daily.  08/31/16  Yes [provider]  Carbidopa-Levodopa ER (SINEMET CR) 25-100 MG tablet controlled release Take 1 tablet by mouth 3 (three) times daily. 09/27/20  Yes [provider]  Cholecalciferol (VITAMIN D3 PO) Take 1,000 Units by mouth daily.   Yes [provider]  imipramine (TOFRANIL) 25 MG tablet Take 100 mg by mouth at  bedtime. 08/23/16  Yes [provider]  lisinopril (ZESTRIL) 10 MG tablet Take 10 mg by mouth daily. 08/26/20  Yes [provider]  Multiple Vitamins-Minerals (PRESERVISION AREDS 2) CAPS Take 1 capsule by mouth daily.   Yes [provider]  ondansetron (ZOFRAN) 4 MG tablet Take 4 mg by mouth every 8 (eight) hours as needed. 09/27/20  Yes [provider]  HYDROcodone-acetaminophen (NORCO/VICODIN) 5-325 MG tablet Take 2 tablets by mouth every 6 (six) hours as needed for moderate pain. Patient not taking: No sig reported 09/28/20   Domenic Moras, PA-C     Allergies:     Allergies  Allergen Reactions  . Sinemet [Carbidopa W-Levodopa] Nausea And Vomiting  . Sulfa Antibiotics Swelling and Rash     Physical Exam:   Vitals  Blood pressure 139/79, pulse 81, temperature 98.4 F (36.9 C), temperature source Oral, resp. rate 18, height 5\' 2"  (1.575 m), SpO2 97 %.   1. General Beese female, laying in bed, no apparent distress  2. Normal affect and insight, Not Suicidal or Homicidal, Awake Alert, Oriented X 3.  3. No F.N deficits, ALL C.Nerves Intact, Strength 5/5 all 4 extremities, Sensation intact all 4 extremities, Plantars down going.  4. Ears and Eyes appear Normal, Conjunctivae clear, PERRLA. Moist Oral Mucosa.  5. Supple Neck, No JVD, No cervical lymphadenopathy appriciated, No Carotid Bruits.  6. Symmetrical Chest wall movement, Good air movement bilaterally, CTAB.  7. RRR, No Gallops, Rubs or Murmurs, No Parasternal Heave.  8. Positive Bowel Sounds, Abdomen Soft, No tenderness, No organomegaly appriciated,No rebound -guarding or rigidity.  9.  No Cyanosis, Normal Skin Turgor, No Skin Rash or Bruise.  10. Good muscle tone,  joints appear normal , no effusions, right lower extremity in a cast.  11. No Palpable Lymph Nodes in Neck or Axillae   Data Review:    CBC Recent Labs  Lab 10/01/20 1543  WBC 6.0  HGB 10.8*  HCT 34.3*  PLT 180  MCV  106.2*  MCH 33.4  MCHC 31.5  RDW 15.1  LYMPHSABS 0.9  MONOABS 0.7  EOSABS 0.1  BASOSABS 0.0   ------------------------------------------------------------------------------------------------------------------  Chemistries  Recent Labs  Lab 10/01/20 1543  NA 138  K 4.2  CL  107  CO2 23  GLUCOSE 113*  BUN 36*  CREATININE 2.22*  CALCIUM 8.6*   ------------------------------------------------------------------------------------------------------------------ estimated creatinine clearance is 24 mL/min (A) (by C-G formula based on SCr of 2.22 mg/dL (H)). ------------------------------------------------------------------------------------------------------------------ No results for input(s): TSH, T4TOTAL, T3FREE, THYROIDAB in the last 72 hours.  Invalid input(s): FREET3  Coagulation profile No results for input(s): INR, PROTIME in the last 168 hours. ------------------------------------------------------------------------------------------------------------------- No results for input(s): DDIMER in the last 72 hours. -------------------------------------------------------------------------------------------------------------------  Cardiac Enzymes No results for input(s): CKMB, TROPONINI, MYOGLOBIN in the last 168 hours.  Invalid input(s): CK ------------------------------------------------------------------------------------------------------------------ No results found for: BNP   ---------------------------------------------------------------------------------------------------------------  Urinalysis    Component Value Date/Time   COLORURINE YELLOW 10/01/2020 [place] 10/01/2020 1530   LABSPEC 1.023 10/01/2020 1530   PHURINE 5.0 10/01/2020 1530   GLUCOSEU NEGATIVE 10/01/2020 1530   HGBUR NEGATIVE 10/01/2020 1530   BILIRUBINUR NEGATIVE 10/01/2020 1530   KETONESUR 5 (A) 10/01/2020 1530   PROTEINUR 30 (A) 10/01/2020 1530   NITRITE NEGATIVE  10/01/2020 [place] 10/01/2020 1530    ----------------------------------------------------------------------------------------------------------------   Imaging Results:    DG Ankle Complete Right  Result Date: 10/01/2020 CLINICAL DATA:  Evaluate alignment of right ankle fracture EXAM: RIGHT ANKLE - COMPLETE 3+ VIEW COMPARISON:  09/28/2020 FINDINGS: Distal fibular fracture with at least 8 mm lateral displacement of distal fracture fragment. Transverse fracture of the medial malleolus with estimated 8 mm lateral displacement of distal fracture fragment. There is worsening lateral subluxation of the talus with respect to the distal tibial articular surface. Minimally displaced posterior malleolar fracture. Small calcaneal spur. Overlying cast material obscures some detail. IMPRESSION: Trimalleolar fracture with worsening lateral subluxation of the talus with respect to the distal tibial articular surface. Electronically Signed   By: Lucrezia Europe M.D.   On: 10/01/2020 16:26   DG Chest Port 1 View  Result Date: 10/01/2020 CLINICAL DATA:  Preoperative EXAM: PORTABLE CHEST 1 VIEW COMPARISON:  None. FINDINGS: The heart size and mediastinal contours are within normal limits. Minimal diffuse interstitial pulmonary opacity. The visualized skeletal structures are unremarkable. IMPRESSION: Minimal diffuse interstitial pulmonary opacity, which may reflect mild edema, infection, and/or chronic interstitial change. No focal airspace opacity. Electronically Signed   By: Eddie Candle M.D.   On: 10/01/2020 16:23      Assessment & Plan:    Active Problems:   Parkinsonism [facility])   Physical deconditioning  Right ankle fracture she has both fibular and tibial fracture, status post reduction in ED 12/8.  Severe deconditioning -Patient supposed to follow with orthopedic Dr. Aline Brochure, appointment scheduled for this Thursday, if patient is here till Monday, will have to consult him during hospital  stay. -Patient has been bedbound since her fracture, unable to ambulate at her home, will consult PT/OT, likely will need SNF placement.  Parkinson disease -Continue with home medications  Urinary retention -[place] to being bedbound for last few days, and and out in ED yielded 400 cc, will continue with bladder scan every 8 hours.  CKD stage III -At baseline.  Hypertension -Continue with home medication  Macrocytic anemia -MCV of 106, will check anemia panel  Anxiety/depression -Continue with home medications     DVT Prophylaxis Heparin   AM Labs Ordered, also please review Full Orders  Family Communication: Admission, patients condition and plan of care including tests being ordered have been discussed with the patient and husband who indicate understanding and agree with the plan and Code Status.  Code Status  full  Likely DC to  SNF  Condition GUARDED    Consults called: none    Admission status: observation    Time spent in minutes : 55 minutes   Phillips Climes M.D on 10/01/2020 at 7:37 PM   [facility] - Office  [phone]

## 2020-10-01 NOTE — ED Notes (Signed)
Report to April, RN  Request for extra hands to move pt as she is total care for moving

## 2020-10-01 NOTE — ED Notes (Signed)
Difficult in and out cath   [facility] NT assisted after request for assist

## 2020-10-01 NOTE — ED Provider Notes (Signed)
[facility] EMERGENCY DEPARTMENT Provider Note   CSN: [id number] Arrival date & time: 10/01/20  1105     History Chief Complaint  Patient presents with  . Urinary Retention    Tonya Perez is a 74 y.o. female.  HPI   This patient is a 74 year old female, she has a history of Parkinson's disease, she has significant difficulty getting around at baseline.  Unfortunately several days ago the patient had an accidental fall where she injured her ankle, the x-ray showed a fracture and subluxation at the ankle joint.  There was both fibular and tibial fractures, the joint was reduced and the patient was discharged home.  The husband reports that over the last several days since going home she has not been out of the bed, he cannot move her, she is too large and too difficult, she has been altered confused and hallucinating, having urinary incontinence on her self and has only been to the bathroom several times in several days.  She was encouraged to come back to the hospital today for evaluation by family doctor with concern that there may be a urinary tract infection or other complicating factor.  The patient and the family member deny any vomiting diarrhea fevers chills coughing shortness of breath or chest pain.  Past Medical History:  Diagnosis Date  . Anxiety   . Depression   . Frequent falls   . High blood pressure   . Kidney disease   . Macular degeneration, dry   . Melanoma ([place])   . Obesity   . Osteoarthritis   . Resting tremor    left hand    Patient Active Problem List   Diagnosis Date Noted  . Parkinsonism ([place]) 04/27/2019  . Compression fracture of L1 lumbar vertebra ([place]) 04/27/2019  . Tremor 02/19/2019  . Closed nondisplaced fracture of head of right radius 10/18/2016    Past Surgical History:  Procedure Laterality Date  . ABDOMINAL HYSTERECTOMY    . CHOLECYSTECTOMY       OB History   No obstetric history on file.     Family History  Problem Relation  Age of Onset  . Other Mother        ruptured gall bladder  . Heart attack Father     Social History   Tobacco Use  . Smoking status: Never Smoker  . Smokeless tobacco: Never Used  Substance Use Topics  . Alcohol use: No  . Drug use: No    Home Medications Prior to Admission medications   Medication Sig Start Date End Date Taking? Authorizing Provider  ALPRAZolam Duanne Moron) 0.5 MG tablet Take 0.5 mg by mouth at bedtime.  08/24/16   [provider]  buPROPion (WELLBUTRIN SR) 200 MG 12 hr tablet Take 400 mg by mouth daily.  08/31/16   [provider]  Cholecalciferol (VITAMIN D3 PO) Take 1,000 Units by mouth daily.    [provider]  HYDROcodone-acetaminophen (NORCO/VICODIN) 5-325 MG tablet Take 2 tablets by mouth every 6 (six) hours as needed for moderate pain. 09/28/20   Domenic Moras, PA-C  imipramine (TOFRANIL) 25 MG tablet Take 25 mg by mouth daily.  08/23/16   [provider]  lisinopril (PRINIVIL,ZESTRIL) 20 MG tablet Take 20 mg by mouth daily.  08/31/16   [provider]  rOPINIRole (REQUIP) 1 MG tablet Take 1 tablet (1 mg total) by mouth 3 (three) times daily. 09/08/19   Marcial Pacas, MD    Allergies    Sinemet Theresa Duty w-levodopa] and  Sulfa antibiotics  Review of Systems   Review of Systems  All other systems reviewed and are negative.   Physical Exam Updated Vital Signs BP 124/69 (BP Location: Right Arm)   Pulse 72   Temp 98.4 F (36.9 C) (Oral)   Resp 16   Ht 1.575 m (5\' 2" )   SpO2 94%   BMI 36.58 kg/m   Physical Exam Vitals and nursing note reviewed.  Constitutional:      General: She is not in acute distress.    Appearance: She is well-developed and well-nourished.  HENT:     Head: Normocephalic and atraumatic.     Mouth/Throat:     Mouth: Oropharynx is clear and moist.     Pharynx: No oropharyngeal exudate.  Eyes:     General: No scleral icterus.       Right eye: No discharge.        Left eye: No discharge.      Extraocular Movements: EOM normal.     Conjunctiva/sclera: Conjunctivae normal.     Pupils: Pupils are equal, round, and reactive to light.  Neck:     Thyroid: No thyromegaly.     Vascular: No JVD.  Cardiovascular:     Rate and Rhythm: Normal rate and regular rhythm.     Pulses: Intact distal pulses.     Heart sounds: Normal heart sounds. No murmur heard. No friction rub. No gallop.   Pulmonary:     Effort: Pulmonary effort is normal. No respiratory distress.     Breath sounds: Normal breath sounds. No wheezing or rales.  Abdominal:     General: Bowel sounds are normal. There is no distension.     Palpations: Abdomen is soft. There is no mass.     Tenderness: There is no abdominal tenderness.  Musculoskeletal:        General: Tenderness and signs of injury present. No edema. Normal range of motion.     Cervical back: Normal range of motion and neck supple.  Lymphadenopathy:     Cervical: No cervical adenopathy.  Skin:    General: Skin is warm and dry.     Findings: No erythema or rash.  Neurological:     Mental Status: She is alert.     Coordination: Coordination normal.     Comments: Tremor intermittently present, patient is able to get to a standing position with 2 people assisting, this is significantly difficult and she is not able to even pivot to get onto the bed by herself.  She is awake alert and is able to follow commands  Psychiatric:        Mood and Affect: Mood and affect normal.        Behavior: Behavior normal.     Comments: Not hallucinating at this time but does appear confused     ED Results / Procedures / Treatments   Labs (all labs ordered are listed, but only abnormal results are displayed) Labs Reviewed  CBC WITH DIFFERENTIAL/PLATELET - Abnormal; Notable for the following components:      Result Value   RBC 3.23 (*)    Hemoglobin 10.8 (*)    HCT 34.3 (*)    MCV 106.2 (*)    All other components within normal limits  BASIC METABOLIC PANEL -  Abnormal; Notable for the following components:   Glucose, Bld 113 (*)    BUN 36 (*)    Creatinine, Ser 2.22 (*)    Calcium 8.6 (*)    GFR,  Estimated 23 (*)    All other components within normal limits  URINALYSIS, ROUTINE W REFLEX MICROSCOPIC - Abnormal; Notable for the following components:   Ketones, ur 5 (*)    Protein, ur 30 (*)    Bacteria, UA RARE (*)    All other components within normal limits  URINE CULTURE  RESP PANEL BY RT-PCR (FLU A&B, COVID) [id number]    EKG EKG Interpretation  Date/Time:  Saturday October 01 2020 17:01:30 EST Ventricular Rate:  77 PR Interval:    QRS Duration: 116 QT Interval:  424 QTC Calculation: 480 R Axis:   41 Text Interpretation: Sinus rhythm Prolonged PR interval Consider left atrial enlargement Nonspecific intraventricular conduction delay Baseline wander in lead(s) V2 V3 Nonspecific T wave abnormality Confirmed by Noemi Chapel [phone]) on 10/01/2020 5:48:57 PM   Radiology DG Ankle Complete Right  Result Date: 10/01/2020 CLINICAL DATA:  Evaluate alignment of right ankle fracture EXAM: RIGHT ANKLE - COMPLETE 3+ VIEW COMPARISON:  09/28/2020 FINDINGS: Distal fibular fracture with at least 8 mm lateral displacement of distal fracture fragment. Transverse fracture of the medial malleolus with estimated 8 mm lateral displacement of distal fracture fragment. There is worsening lateral subluxation of the talus with respect to the distal tibial articular surface. Minimally displaced posterior malleolar fracture. Small calcaneal spur. Overlying cast material obscures some detail. IMPRESSION: Trimalleolar fracture with worsening lateral subluxation of the talus with respect to the distal tibial articular surface. Electronically Signed   By: Lucrezia Europe M.D.   On: 10/01/2020 16:26   DG Chest Port 1 View  Result Date: 10/01/2020 CLINICAL DATA:  Preoperative EXAM: PORTABLE CHEST 1 VIEW COMPARISON:  None. FINDINGS: The heart size and mediastinal contours  are within normal limits. Minimal diffuse interstitial pulmonary opacity. The visualized skeletal structures are unremarkable. IMPRESSION: Minimal diffuse interstitial pulmonary opacity, which may reflect mild edema, infection, and/or chronic interstitial change. No focal airspace opacity. Electronically Signed   By: Eddie Candle M.D.   On: 10/01/2020 16:23    Procedures Procedures (including critical care time)  Medications Ordered in ED Medications  0.9 %  sodium chloride infusion (has no administration in time range)    ED Course  I have reviewed the triage vital signs and the nursing notes.  Pertinent labs & imaging results that were available during my care of the patient were reviewed by me and considered in my medical decision making (see chart for details).  Clinical Course as of 10/01/20 1749  Sat Oct 01, 2020  1607 I have personally looked at the chest x-ray as well as the ankle x-rays, my interpretation is that there is still what appears to be a bimalleolar fracture with a slight subluxation of the ankle on the right, the chest x-ray appears to be overall clear with possibly a subtle infiltrate at the left base or atelectasis but no other significant findings [BM]    Clinical Course User Index [BM] Noemi Chapel, MD   MDM Rules/Calculators/A&P                          This patient has been incontinent on herself while waiting in the waiting room, she appears to have some progressive illness which is likely infectious in nature, she may have metabolic derangements or an acute kidney injury, labs will be ordered, urinalysis pain and out catheterization, hydration and likely admission to the hospital as this patient is not able to be cared for at home.  Labs  have been reviewed, the patient does have a mild acute kidney injury with a creatinine of 2.22, this is up from her creatinine of 1.9 in the past.  Urinalysis negative for infection, no leukocytosis, no significant  anemia.  Chest x-ray shows possible mild edema, I discussed the x-ray of the ankle with Dr. Percell Mikaele Stecher on-call for orthopedics who recommends that the patient needs to be seen on Monday by orthopedics whether it is in the office or the hospital by local orthopedist here.  There is no need for emergent surgery or stabilizing care of the ankle based on how it looks today.  I discussed the case with Dr. Waldron Labs who will see the patient in consultation to give his medical recommendations or possibly admit, we will need to get social work or transition of care involved.  Final Clinical Impression(s) / ED Diagnoses Final diagnoses:  Acute confusion  AKI (acute kidney injury) ([place])  Failure to thrive in adult    Rx / DC Orders ED Discharge Orders    None       Noemi Chapel, MD 10/01/20 1749

## 2020-10-02 ENCOUNTER — Encounter (HOSPITAL_COMMUNITY): Payer: Self-pay | Admitting: Internal Medicine

## 2020-10-02 DIAGNOSIS — R627 Adult failure to thrive: Secondary | ICD-10-CM

## 2020-10-02 DIAGNOSIS — F339 Major depressive disorder, recurrent, unspecified: Secondary | ICD-10-CM | POA: Diagnosis present

## 2020-10-02 DIAGNOSIS — R251 Tremor, unspecified: Secondary | ICD-10-CM | POA: Diagnosis present

## 2020-10-02 DIAGNOSIS — M6281 Muscle weakness (generalized): Secondary | ICD-10-CM | POA: Diagnosis present

## 2020-10-02 DIAGNOSIS — H353190 Nonexudative age-related macular degeneration, unspecified eye, stage unspecified: Secondary | ICD-10-CM | POA: Diagnosis present

## 2020-10-02 DIAGNOSIS — N179 Acute kidney failure, unspecified: Secondary | ICD-10-CM

## 2020-10-02 DIAGNOSIS — I10 Essential (primary) hypertension: Secondary | ICD-10-CM | POA: Diagnosis present

## 2020-10-02 DIAGNOSIS — Z79899 Other long term (current) drug therapy: Secondary | ICD-10-CM | POA: Diagnosis not present

## 2020-10-02 DIAGNOSIS — R32 Unspecified urinary incontinence: Secondary | ICD-10-CM | POA: Diagnosis present

## 2020-10-02 DIAGNOSIS — R279 Unspecified lack of coordination: Secondary | ICD-10-CM | POA: Diagnosis not present

## 2020-10-02 DIAGNOSIS — M1990 Unspecified osteoarthritis, unspecified site: Secondary | ICD-10-CM | POA: Diagnosis present

## 2020-10-02 DIAGNOSIS — Z8249 Family history of ischemic heart disease and other diseases of the circulatory system: Secondary | ICD-10-CM | POA: Diagnosis not present

## 2020-10-02 DIAGNOSIS — R339 Retention of urine, unspecified: Secondary | ICD-10-CM | POA: Diagnosis present

## 2020-10-02 DIAGNOSIS — R296 Repeated falls: Secondary | ICD-10-CM

## 2020-10-02 DIAGNOSIS — S82201A Unspecified fracture of shaft of right tibia, initial encounter for closed fracture: Secondary | ICD-10-CM | POA: Diagnosis present

## 2020-10-02 DIAGNOSIS — D631 Anemia in chronic kidney disease: Secondary | ICD-10-CM | POA: Diagnosis present

## 2020-10-02 DIAGNOSIS — I959 Hypotension, unspecified: Secondary | ICD-10-CM | POA: Diagnosis not present

## 2020-10-02 DIAGNOSIS — Z7409 Other reduced mobility: Secondary | ICD-10-CM | POA: Diagnosis not present

## 2020-10-02 DIAGNOSIS — Z7401 Bed confinement status: Secondary | ICD-10-CM | POA: Diagnosis not present

## 2020-10-02 DIAGNOSIS — I129 Hypertensive chronic kidney disease with stage 1 through stage 4 chronic kidney disease, or unspecified chronic kidney disease: Secondary | ICD-10-CM | POA: Diagnosis present

## 2020-10-02 DIAGNOSIS — R2689 Other abnormalities of gait and mobility: Secondary | ICD-10-CM | POA: Diagnosis present

## 2020-10-02 DIAGNOSIS — Z888 Allergy status to other drugs, medicaments and biological substances status: Secondary | ICD-10-CM | POA: Diagnosis not present

## 2020-10-02 DIAGNOSIS — S82851A Displaced trimalleolar fracture of right lower leg, initial encounter for closed fracture: Secondary | ICD-10-CM | POA: Diagnosis present

## 2020-10-02 DIAGNOSIS — D539 Nutritional anemia, unspecified: Secondary | ICD-10-CM | POA: Diagnosis present

## 2020-10-02 DIAGNOSIS — Z882 Allergy status to sulfonamides status: Secondary | ICD-10-CM | POA: Diagnosis not present

## 2020-10-02 DIAGNOSIS — E669 Obesity, unspecified: Secondary | ICD-10-CM | POA: Diagnosis present

## 2020-10-02 DIAGNOSIS — S82401S Unspecified fracture of shaft of right fibula, sequela: Secondary | ICD-10-CM | POA: Diagnosis not present

## 2020-10-02 DIAGNOSIS — X501XXA Overexertion from prolonged static or awkward postures, initial encounter: Secondary | ICD-10-CM | POA: Diagnosis not present

## 2020-10-02 DIAGNOSIS — R338 Other retention of urine: Secondary | ICD-10-CM | POA: Diagnosis not present

## 2020-10-02 DIAGNOSIS — S82201S Unspecified fracture of shaft of right tibia, sequela: Secondary | ICD-10-CM

## 2020-10-02 DIAGNOSIS — H3530 Unspecified macular degeneration: Secondary | ICD-10-CM | POA: Diagnosis present

## 2020-10-02 DIAGNOSIS — Z85820 Personal history of malignant melanoma of skin: Secondary | ICD-10-CM | POA: Diagnosis not present

## 2020-10-02 DIAGNOSIS — N1832 Chronic kidney disease, stage 3b: Secondary | ICD-10-CM | POA: Diagnosis present

## 2020-10-02 DIAGNOSIS — S82201D Unspecified fracture of shaft of right tibia, subsequent encounter for closed fracture with routine healing: Secondary | ICD-10-CM | POA: Diagnosis not present

## 2020-10-02 DIAGNOSIS — Z9049 Acquired absence of other specified parts of digestive tract: Secondary | ICD-10-CM | POA: Diagnosis not present

## 2020-10-02 DIAGNOSIS — F32A Depression, unspecified: Secondary | ICD-10-CM | POA: Diagnosis present

## 2020-10-02 DIAGNOSIS — W19XXXA Unspecified fall, initial encounter: Secondary | ICD-10-CM | POA: Diagnosis present

## 2020-10-02 DIAGNOSIS — S82401A Unspecified fracture of shaft of right fibula, initial encounter for closed fracture: Secondary | ICD-10-CM | POA: Diagnosis present

## 2020-10-02 DIAGNOSIS — R41841 Cognitive communication deficit: Secondary | ICD-10-CM | POA: Diagnosis present

## 2020-10-02 DIAGNOSIS — Z20822 Contact with and (suspected) exposure to covid-19: Secondary | ICD-10-CM | POA: Diagnosis present

## 2020-10-02 DIAGNOSIS — F419 Anxiety disorder, unspecified: Secondary | ICD-10-CM | POA: Diagnosis present

## 2020-10-02 DIAGNOSIS — G20 Parkinson's disease: Secondary | ICD-10-CM | POA: Diagnosis present

## 2020-10-02 DIAGNOSIS — S82401D Unspecified fracture of shaft of right fibula, subsequent encounter for closed fracture with routine healing: Secondary | ICD-10-CM | POA: Diagnosis not present

## 2020-10-02 DIAGNOSIS — Z90710 Acquired absence of both cervix and uterus: Secondary | ICD-10-CM | POA: Diagnosis not present

## 2020-10-02 LAB — BASIC METABOLIC PANEL
Anion gap: 8 (ref 5–15)
BUN: 35 mg/dL — ABNORMAL HIGH (ref 8–23)
CO2: 25 mmol/L (ref 22–32)
Calcium: 8.5 mg/dL — ABNORMAL LOW (ref 8.9–10.3)
Chloride: 105 mmol/L (ref 98–111)
Creatinine, Ser: 1.76 mg/dL — ABNORMAL HIGH (ref 0.44–1.00)
GFR, Estimated: 30 mL/min — ABNORMAL LOW (ref >60)
Glucose, Bld: 97 mg/dL (ref 70–99)
Potassium: 4.1 mmol/L (ref 3.5–5.1)
Sodium: 138 mmol/L (ref 135–145)

## 2020-10-02 LAB — CBC
HCT: 32.8 % — ABNORMAL LOW (ref 36.0–46.0)
Hemoglobin: 10.3 g/dL — ABNORMAL LOW (ref 12.0–15.0)
MCH: 33.2 pg (ref 26.0–34.0)
MCHC: 31.4 g/dL (ref 30.0–36.0)
MCV: 105.8 fL — ABNORMAL HIGH (ref 80.0–100.0)
Platelets: 176 10*3/uL (ref 150–400)
RBC: 3.1 MIL/uL — ABNORMAL LOW (ref 3.87–5.11)
RDW: 15 % (ref 11.5–15.5)
WBC: 6.3 10*3/uL (ref 4.0–10.5)
nRBC: 0 % (ref 0.0–0.2)

## 2020-10-02 MED ORDER — ACETAMINOPHEN 325 MG PO TABS: 650.0000 mg | ORAL_TABLET | Freq: Four times a day (QID) | ORAL | Status: DC | PRN

## 2020-10-02 NOTE — Progress Notes (Signed)
  Transition of Care (TOC) -30 day Note       Patient Details  Name: Tonya Perez MRN: [id number] Date of Birth: November 11, 1945   [facility] of [facility] CM/SW Contact  Name: Pati Gallo Phone Number: [phone] Date: 10/02/2020 Time: 1429   MUST ID: [id number]   To Whom it May Concern:   Please be advised that the above patient will require a short-term nursing home stay, anticipated 30 days or less rehabilitation and strengthening. The plan is for return home.

## 2020-10-02 NOTE — NC FL2 (Signed)
[place] LEVEL OF CARE SCREENING TOOL     IDENTIFICATION  Patient Name: Tonya Perez Birthdate: 1945-10-25 Sex: female Admission Date (Current Location): 10/01/2020  [facility] and [place] Number:  [employer] and Address:  [place] [address], [place]      Provider Number: [id number]  Attending Physician Name and Address:  Murlean Iba, MD  Relative Name and Phone Number:       Current Level of Care: Hospital Recommended Level of Care: [place] Prior Approval Number:    Date Approved/Denied:   PASRR Number:    Discharge Plan: SNF    Current Diagnoses: Patient Active Problem List   Diagnosis Date Noted  . Failure to thrive in adult 10/02/2020  . Tibia/fibula fracture, right, sequela 10/02/2020  . Closed fracture of right tibia and fibula 10/02/2020  . AKI (acute kidney injury) (Between) 10/02/2020  . CKD stage G3b/A3, GFR 30-44 and albumin creatinine ratio >300 mg/g (HCC) 10/02/2020  . Immobility 10/02/2020  . Macular degeneration, dry   . Acute urinary retention   . High blood pressure   . Frequent falls   . Obesity   . Physical deconditioning 10/01/2020  . Parkinsonism ([place]) 04/27/2019  . Compression fracture of L1 lumbar vertebra ([place]) 04/27/2019  . Tremor 02/19/2019  . Closed nondisplaced fracture of head of right radius 10/18/2016    Orientation RESPIRATION BLADDER Height & Weight     Self,Situation,Place  Normal Continent Weight: 214 lb 1.1 oz (97.1 kg) Height:  5\' 3"  (160 cm)  BEHAVIORAL SYMPTOMS/MOOD NEUROLOGICAL BOWEL NUTRITION STATUS      Continent Diet (see dc summary)  AMBULATORY STATUS COMMUNICATION OF NEEDS Skin   Extensive Assist Verbally Normal                       Personal Care Assistance Level of Assistance  Bathing,Feeding,Dressing Bathing Assistance: Maximum assistance Feeding assistance: Independent Dressing Assistance: Limited assistance      Functional Limitations Info  Sight,Hearing,Speech Sight Info: Adequate Hearing Info: Adequate Speech Info: Adequate    SPECIAL CARE FACTORS FREQUENCY  PT (By licensed PT),OT (By licensed OT)     PT Frequency: 5x week OT Frequency: 3x week            Contractures Contractures Info: Not present    Additional Factors Info  Code Status,Allergies,Psychotropic Code Status Info: Full Allergies Info: Sinemet, Sulfa Antibiotics Psychotropic Info: Xanax, Wellbutrin, Abilify         Current Medications (10/02/2020):  This is the current hospital active medication list Current Facility-Administered Medications  Medication Dose Route Frequency Provider Last Rate Last Admin  . acetaminophen (TYLENOL) tablet 650 mg  650 mg Oral Q6H PRN Johnson, Clanford L, MD      . ALPRAZolam Duanne Moron) tablet 0.5 mg  0.5 mg Oral QHS Elgergawy, Silver Huguenin, MD   0.5 mg at 10/01/20 2135  . ARIPiprazole (ABILIFY) tablet 5 mg  5 mg Oral Daily Elgergawy, Silver Huguenin, MD   5 mg at 10/02/20 1021  . buPROPion (WELLBUTRIN SR) 12 hr tablet 400 mg  400 mg Oral Daily Elgergawy, Silver Huguenin, MD   400 mg at 10/02/20 1020  . Carbidopa-Levodopa ER (SINEMET CR) 25-100 MG tablet controlled release 1 tablet  1 tablet Oral TID Elgergawy, Silver Huguenin, MD   1 tablet at 10/02/20 1021  . cholecalciferol (VITAMIN D3) tablet 1,000 Units  1,000 Units Oral Daily Elgergawy, Silver Huguenin, MD  1,000 Units at 10/02/20 1020  . heparin injection 5,000 Units  5,000 Units Subcutaneous Q8H Elgergawy, Silver Huguenin, MD   5,000 Units at 10/02/20 1413  . HYDROcodone-acetaminophen (NORCO/VICODIN) 5-325 MG per tablet 2 tablet  2 tablet Oral Q6H PRN Elgergawy, Silver Huguenin, MD      . imipramine (TOFRANIL) tablet 25 mg  25 mg Oral QHS Elgergawy, Silver Huguenin, MD   25 mg at 10/01/20 2135  . lisinopril (ZESTRIL) tablet 10 mg  10 mg Oral Daily Elgergawy, Silver Huguenin, MD   10 mg at 10/02/20 1021  . multivitamin-lutein (OCUVITE-LUTEIN) capsule 1 capsule  1 capsule Oral Daily  Elgergawy, Silver Huguenin, MD   1 capsule at 10/02/20 1020  . rOPINIRole (REQUIP) tablet 1 mg  1 mg Oral TID Elgergawy, Silver Huguenin, MD   1 mg at 10/02/20 1021     Discharge Medications: Please see discharge summary for a list of discharge medications.  Relevant Imaging Results:  Relevant Lab Results:   Additional Information SSN: [id number] LCSW

## 2020-10-02 NOTE — Progress Notes (Signed)
Bladder scanned patient earlier in shift bladder scan showed >568. Dr.Johnson placed order to in and out cath if patient is unable to void. Patient voided 100cc of urine before bladder scan. In and out catheter performed maintained sterile technique nurse tech Lonn Georgia) assisted. Patient tolerated well. 600cc of urine returned. Will continue to assess throughout shift.

## 2020-10-02 NOTE — Evaluation (Signed)
Physical Therapy Evaluation Patient Details Name: Tonya Perez MRN: [id number] DOB: [birth year] Today's Date: 10/02/2020   History of Present Illness  74 y.o. female, medical history of Parkinson disease, obesity, anxiety and depression, patient lives at home with her husband, usually she is requiring walker or cane for ambulation, patient with accidental fall last Wednesday, 09/28/2020, for which she sustained left ankle fracture, and reduction in ED, and she was discharged home with outpatient follow-up with orthopedic Dr. Aline Brochure for this coming Thursday, per husband patient has been bedbound since, was not able to stand up or to ambulate, for last several days, he reports she is too large and too difficult to ambulate, he does report she was altered and confused yesterday, as well reports she has some urinary incontinence and difficulty urinating as well, PT has been arranged by her PCP as home health physical therapy went to evaluate her today, was turned about her and directed her to come to ED.  In ED mentation at baseline, creatinine at 2.2 which is around her baseline, she had urinary retention required in and out with 400 cc output,[facility] hospitalist consulted to admit as patient is unsafe for discharge home.    Clinical Impression  Pt admitted with above diagnosis. Patient agreeable to participating in PT evaluation today. Husband present throughout and primary historian. Husband reports patient's mentation has been altered the past several days. Patient NWB on the RLE ordered by MD. Patient requires minimal assistance but multimodal cuing for bed mobility. Patient requires max assistance and multimodal cuing for sit to stand transfers with or without RW/assistive devices. Patient would benefit from overhead trapeze to increase independent in bed mobility and for upper extremity strengthening. This was communicated with nursing. Patient required max assist for short partial stand/lateral scoots to  her left x2 to move towards the head of the bed prior to lying down. Pt currently with functional limitations due to the deficits listed below (see PT Problem List). Pt will benefit from skilled PT to increase their independence and safety with mobility to allow discharge to the venue listed below.      Follow Up Recommendations SNF;Supervision for mobility/OOB;Supervision/Assistance - 24 hour    Equipment Recommendations  None recommended by PT    Recommendations for Other Services       Precautions / Restrictions Precautions Precautions: Fall Precaution Comments: patient reports 3 falls in the last six months Restrictions Weight Bearing Restrictions: Yes RLE Weight Bearing: Non weight bearing      Mobility  Bed Mobility Overal bed mobility: Needs Assistance Bed Mobility: Supine to Sit;Sit to Supine     Supine to sit: Min assist Sit to supine: Min assist   General bed mobility comments: slow, labored movement requiring multimodal cues    Transfers Overall transfer level: Needs assistance Equipment used: Rolling walker (2 wheeled) Transfers: Sit to/from Stand;Lateral/Scoot Transfers Sit to Stand: Max assist;From elevated surface        Lateral/Scoot Transfers: Max assist;From elevated surface General transfer comment: slow, labored movement requiring multimodal cues, difficulty maintaining MWB on RLE, cues for sequencing of steps and placement of hands; unable to fully stand  Ambulation/Gait     General Gait Details: unable  Stairs   Wheelchair Mobility    Modified Rankin (Stroke Patients Only)       Balance Overall balance assessment: Needs assistance;History of Falls Sitting-balance support: Bilateral upper extremity supported;Feet supported Sitting balance-Leahy Scale: Fair     Standing balance support: Bilateral upper extremity supported;During functional activity Standing  balance-Leahy Scale: Zero       Pertinent Vitals/Pain Pain Assessment:  No/denies pain    Home Living Family/patient expects to be discharged to:: Private residence Living Arrangements: Spouse/significant other Available Help at Discharge: Family;Available PRN/intermittently Type of Home: House Home Access: Stairs to enter Entrance Stairs-Rails: Left;Right;Can reach both Entrance Stairs-Number of Steps: 3 Home Layout: One level Home Equipment: Grab bars - toilet;Wheelchair - manual;Tub bench;Hand held shower head;Grab bars - tub/shower;Walker - 4 wheels      Prior Function Level of Independence: Independent with assistive device(s);Needs assistance   Gait / Transfers Assistance Needed: Patient would ambulate without assistive devices in home; ambulated with RW in the community and husband required to supervise her for safety.  ADL's / Homemaking Assistance Needed: Independent        Hand Dominance   Dominant Hand: Left    Extremity/Trunk Assessment   Upper Extremity Assessment Upper Extremity Assessment: Generalized weakness    Lower Extremity Assessment Lower Extremity Assessment: Generalized weakness;RLE deficits/detail RLE Deficits / Details: NWB, decreased ankle and toes ROM       Communication   Communication: No difficulties  Cognition Arousal/Alertness: Awake/alert Behavior During Therapy: WFL for tasks assessed/performed Overall Cognitive Status: Impaired/Different from baseline Area of Impairment: Memory;Following commands;Problem solving;Safety/judgement    General Comments      Exercises     Assessment/Plan    PT Assessment Patient needs continued PT services  PT Problem List Decreased strength;Decreased cognition;Decreased knowledge of use of DME;Decreased activity tolerance;Decreased safety awareness;Decreased balance;Decreased mobility       PT Treatment Interventions DME instruction;Balance training;Gait training;Patient/family education;Therapeutic activities;Therapeutic exercise;Functional mobility  training;Wheelchair mobility training    PT Goals (Current goals can be found in the Care Plan section)  Acute Rehab PT Goals Patient Stated Goal: Patient doesn't want to be away from family; husband agreeable to SNF placement. PT Goal Formulation: With patient/family Time For Goal Achievement: 10/16/20 Potential to Achieve Goals: Fair    Frequency Min 3X/week   Barriers to discharge           AM-PAC PT "6 Clicks" Mobility  Outcome Measure Help needed turning from your back to your side while in a flat bed without using bedrails?: A Little Help needed moving from lying on your back to sitting on the side of a flat bed without using bedrails?: A Little Help needed moving to and from a bed to a chair (including a wheelchair)?: Total Help needed standing up from a chair using your arms (e.g., wheelchair or bedside chair)?: Total Help needed to walk in hospital room?: Total Help needed climbing 3-5 steps with a railing? : Total 6 Click Score: 10    End of Session Equipment Utilized During Treatment: Gait belt Activity Tolerance: Patient limited by fatigue Patient left: in bed;with call bell/phone within reach;with bed alarm set;with family/visitor present Nurse Communication: Mobility status PT Visit Diagnosis: Unsteadiness on feet (R26.81);Other abnormalities of gait and mobility (R26.89);Repeated falls (R29.6);Muscle weakness (generalized) (M62.81);Difficulty in walking, not elsewhere classified (R26.2)    Time: 1320-1350 PT Time Calculation (min) (ACUTE ONLY): 30 min   Charges:   PT Evaluation $PT Eval Moderate Complexity: 1 Mod PT Treatments $Therapeutic Activity: 8-22 mins        Floria Raveling. Hartnett-Rands, MS, PT Per [place] [phone] 10/02/2020, 2:21 PM

## 2020-10-02 NOTE — Progress Notes (Signed)
PROGRESS NOTE   Tonya Perez  OJJ:[id number] DOB: 1946/06/05 DOA: 10/01/2020 PCP: Celene Squibb, MD   Chief Complaint  Patient presents with  . Urinary Retention    Brief Admission History:  74 y.o. female, medical history of Parkinson disease, obesity, anxiety and depression, patient lives at home with her husband, usually she is requiring walker or cane for ambulation, patient with accidental fall last Wednesday, 09/28/2020, for which she sustained left ankle fracture, and reduction in ED, and she was discharged home with outpatient follow-up with orthopedic Dr. Aline Brochure for this coming Thursday, per husband patient has been bedbound since, was not able to stand up or to ambulate, for last several days, he reports she is too large and too difficult to ambulate, he does report she was altered and confused yesterday, as well reports she has some urinary incontinence and difficulty urinating as well, PT has been arranged by her PCP as an outpatient, physical therapy went to evaluate her today, was turned about her and directed her to come to ED.  In ED mentation at baseline, creatinine at 2.2 which is around her baseline, she had urinary retention required in and out with 400 cc output,[facility] hospitalist consulted to admit as patient is unsafe for discharge home.   Assessment & Plan:   Principal Problem:   Closed fracture of right tibia and fibula Active Problems:   Tremor   Parkinsonism (HCC)   Physical deconditioning   Failure to thrive in adult   Tibia/fibula fracture, right, sequela   AKI (acute kidney injury) ([place])   CKD stage G3b/A3, GFR 30-44 and albumin creatinine ratio >300 mg/g (HCC)   Immobility   Macular degeneration, dry   Acute urinary retention   High blood pressure   Frequent falls   Obesity   1. Right ankle tib/fib fracture -causing significant problems with mobility, patient unable to do ADLs without significant assistance.  Pt unable to get to an outpatient appt to see  orthopedics.  I will ask for an inpatient ortho consultation 12/13.   2. AKI on CKD stage 3b - creatinine improved after therapies.  3. HTN - resume home blood pressure lowering medication.  4. Physical debility - I have asked for an urgent PT evaluation for SNF placement.  5. Acute urinary retention - s/p I/O cath, will follow urine output.    DVT prophylaxis: SQ heparin  Code Status: Full  Family Communication:  Disposition: TBD, anticipating SNF   Status is: Observation  The patient remains OBS appropriate and will d/c before 2 midnights.  Dispo: The patient is from: Home              Anticipated d/c is to: SNF              Anticipated d/c date is: 1 day              Patient currently is medically stable to d/c.  Consultants:   Orthopedics   Procedures:   n/a  Antimicrobials:  n/a  Subjective: Pt reports that she has a difficult time moving due to her ankle fracture.   Objective: Vitals:   10/01/20 2008 10/01/20 2029 10/02/20 0007 10/02/20 0512  BP: 130/65 (!) 104/52 (!) 110/48 115/82  Pulse: 83 82 80 95  Resp: 20 20 16 19   Temp: 98.8 F (37.1 C) 98.5 F (36.9 C) (!) 96.9 F (36.1 C) (!) 96.9 F (36.1 C)  TempSrc: Oral Oral    SpO2: 96% 96% 95% 98%  Weight:  97.1 kg    Height:  5\' 3"  (1.6 m)     No intake or output data in the 24 hours ending 10/02/20 1013 Filed Weights   10/01/20 2029  Weight: 97.1 kg   Examination:  General exam: awake, alert, no distress, Appears calm and comfortable  Respiratory system: Clear to auscultation. Respiratory effort normal. Cardiovascular system: S1 & S2 heard, RRR. No JVD, murmurs, rubs, gallops or clicks. No pedal edema. Gastrointestinal system: Abdomen is nondistended, soft and nontender. No organomegaly or masses felt. Normal bowel sounds heard. Central nervous system: Alert and oriented. No focal neurological deficits. Extremities: right ankle casted.  Skin: No rashes, lesions or ulcers Psychiatry: Judgement and  insight appear normal. Mood & affect appropriate.   Data Reviewed: I have personally reviewed following labs and imaging studies  CBC: Recent Labs  Lab 10/01/20 1543 10/02/20 0728  WBC 6.0 6.3  NEUTROABS 4.4  --   HGB 10.8* 10.3*  HCT 34.3* 32.8*  MCV 106.2* 105.8*  PLT 180 338    Basic Metabolic Panel: Recent Labs  Lab 10/01/20 1543 10/02/20 0728  NA 138 138  K 4.2 4.1  CL 107 105  CO2 23 25  GLUCOSE 113* 97  BUN 36* 35*  CREATININE 2.22* 1.76*  CALCIUM 8.6* 8.5*    GFR: Estimated Creatinine Clearance: 32.1 mL/min (A) (by C-G formula based on SCr of 1.76 mg/dL (H)).  Liver Function Tests: No results for input(s): AST, ALT, ALKPHOS, BILITOT, PROT, ALBUMIN in the last 168 hours.  CBG: No results for input(s): GLUCAP in the last 168 hours.  Recent Results (from the past 240 hour(s))  Resp Panel by RT-PCR (Flu A&B, Covid) Nasopharyngeal Swab     Status: None   Collection Time: 10/01/20  5:15 PM   Specimen: Nasopharyngeal Swab; Nasopharyngeal(NP) swabs in vial transport medium  Result Value Ref Range Status   SARS Coronavirus 2 by RT PCR NEGATIVE NEGATIVE Final    Comment: (NOTE) SARS-CoV-2 target nucleic acids are NOT DETECTED.  The SARS-CoV-2 RNA is generally detectable in upper respiratory specimens during the acute phase of infection. The lowest concentration of SARS-CoV-2 viral copies this assay can detect is 138 copies/mL. A negative result does not preclude SARS-Cov-2 infection and should not be used as the sole basis for treatment or other patient management decisions. A negative result may occur with  improper specimen collection/handling, submission of specimen other than nasopharyngeal swab, presence of viral mutation(s) within the areas targeted by this assay, and inadequate number of viral copies(<138 copies/mL). A negative result must be combined with clinical observations, patient history, and epidemiological information. The expected result is  Negative.  Fact Sheet for Patients:  EntrepreneurPulse.com.au  Fact Sheet for Healthcare Providers:  IncredibleEmployment.be  This test is no t yet approved or cleared by the [place] FDA and  has been authorized for detection and/or diagnosis of SARS-CoV-2 by FDA under an Emergency Use Authorization (EUA). This EUA will remain  in effect (meaning this test can be used) for the duration of the COVID-19 declaration under Section 564(b)(1) of the Act, 21 U.S.C.section 360bbb-3(b)(1), unless the authorization is terminated  or revoked sooner.       Influenza A by PCR NEGATIVE NEGATIVE Final   Influenza B by PCR NEGATIVE NEGATIVE Final    Comment: (NOTE) The Xpert Xpress SARS-CoV-2/FLU/RSV plus assay is intended as an aid in the diagnosis of influenza from Nasopharyngeal swab specimens and should not be used as a sole basis for treatment. Nasal  washings and aspirates are unacceptable for Xpert Xpress SARS-CoV-2/FLU/RSV testing.  Fact Sheet for Patients: EntrepreneurPulse.com.au  Fact Sheet for Healthcare Providers: IncredibleEmployment.be  This test is not yet approved or cleared by the [place] FDA and has been authorized for detection and/or diagnosis of SARS-CoV-2 by FDA under an Emergency Use Authorization (EUA). This EUA will remain in effect (meaning this test can be used) for the duration of the COVID-19 declaration under Section 564(b)(1) of the Act, 21 U.S.C. section 360bbb-3(b)(1), unless the authorization is terminated or revoked.  Performed at [facility], [address] [place], [place] [address]      Radiology Studies: DG Ankle Complete Right  Result Date: 10/01/2020 CLINICAL DATA:  Evaluate alignment of right ankle fracture EXAM: RIGHT ANKLE - COMPLETE 3+ VIEW COMPARISON:  09/28/2020 FINDINGS: Distal fibular fracture with at least 8 mm lateral displacement of distal fracture  fragment. Transverse fracture of the medial malleolus with estimated 8 mm lateral displacement of distal fracture fragment. There is worsening lateral subluxation of the talus with respect to the distal tibial articular surface. Minimally displaced posterior malleolar fracture. Small calcaneal spur. Overlying cast material obscures some detail. IMPRESSION: Trimalleolar fracture with worsening lateral subluxation of the talus with respect to the distal tibial articular surface. Electronically Signed   By: Lucrezia Europe M.D.   On: 10/01/2020 16:26   DG Chest Port 1 View  Result Date: 10/01/2020 CLINICAL DATA:  Preoperative EXAM: PORTABLE CHEST 1 VIEW COMPARISON:  None. FINDINGS: The heart size and mediastinal contours are within normal limits. Minimal diffuse interstitial pulmonary opacity. The visualized skeletal structures are unremarkable. IMPRESSION: Minimal diffuse interstitial pulmonary opacity, which may reflect mild edema, infection, and/or chronic interstitial change. No focal airspace opacity. Electronically Signed   By: Eddie Candle M.D.   On: 10/01/2020 16:23   Scheduled Meds: . ALPRAZolam  0.5 mg Oral QHS  . ARIPiprazole  5 mg Oral Daily  . buPROPion  400 mg Oral Daily  . Carbidopa-Levodopa ER  1 tablet Oral TID  . cholecalciferol  1,000 Units Oral Daily  . heparin  5,000 Units Subcutaneous Q8H  . imipramine  25 mg Oral QHS  . lisinopril  10 mg Oral Daily  . multivitamin-lutein  1 capsule Oral Daily  . rOPINIRole  1 mg Oral TID   Continuous Infusions:   LOS: 0 days   Time spent: 35 mins   Junaid Wurzer Wynetta Emery, MD How to contact the [facility] Attending or Consulting provider [place] or covering provider during after hours [place], for this patient?  1. Check the care team in [facility] and look for a) attending/consulting [facility] provider listed and b) the [facility] team listed 2. Log into [email or web address] and use [place]'s universal password to access. If you do not have the password, please contact the  hospital operator. 3. Locate the [facility] provider you are looking for under [facility] and page to a number that you can be directly reached. 4. If you still have difficulty reaching the provider, please page the [facility] (Director on Call) for the Hospitalists listed on amion for assistance.  10/02/2020, 10:13 AM

## 2020-10-02 NOTE — Plan of Care (Signed)
  Problem: Acute Rehab PT Goals(only PT should resolve) Goal: Pt will Roll Supine to Side Outcome: Progressing Flowsheets (Taken 10/02/2020 1421) Pt will Roll Supine to Side: with min assist Goal: Pt Will Go Supine/Side To Sit Outcome: Progressing Flowsheets (Taken 10/02/2020 1421) Pt will go Supine/Side to Sit:  with min guard assist  with HOB elevated Goal: Pt Will Go Sit To Supine/Side Outcome: Progressing Flowsheets (Taken 10/02/2020 1421) Pt will go Sit to Supine/Side:  with minimal assist  with HOB  elevated Goal: Patient Will Transfer Sit To/From Stand Outcome: Progressing Flowsheets (Taken 10/02/2020 1421) Patient will transfer sit to/from stand: with maximum assist Goal: Pt Will Transfer Bed To Chair/Chair To Bed Outcome: Progressing Flowsheets (Taken 10/02/2020 1421) Pt will Transfer Bed to Chair/Chair to Bed: with max assist Goal: Pt/caregiver will Perform Home Exercise Program Outcome: Progressing Flowsheets (Taken 10/02/2020 1421) Pt/caregiver will Perform Home Exercise Program:  For increased strengthening  For increased ROM  With minimal assist    Pamala Hurry D. Hartnett-Rands, MS, PT Per [place] [phone] 10/02/2020

## 2020-10-02 NOTE — TOC Initial Note (Signed)
Transition of Care [facility]) - Initial/Assessment Note    Patient Details  Name: Tonya Perez MRN: [id number] Date of Birth: 31-Mar-1946  Transition of Care [facility]) CM/SW Contact:    Shade Flood, LCSW Phone Number: 10/02/2020, 2:24 PM  Clinical Narrative:                  Pt admitted from home. Pt was recently discharged home from [facility] with HHPT from [facility] after an ankle fracture. Pt is admitted observation status at this time. PT recommending SNF. Spoke with pt's husband by phone. Provided information on CMS provider options and will refer as requested to [place] facilities. Pt is vaccinated for Covid. PASRR screen initiated.  TOC will follow and assist with dc planning.  Expected Discharge Plan: Skilled Nursing Facility Barriers to Discharge: Continued Medical Work up,SNF Pending bed offer,Awaiting State Approval (PASRR)   Patient Goals and CMS Choice Patient states their goals for this hospitalization and ongoing recovery are:: rehab CMS Medicare.gov Compare Post Acute Care list provided to:: Patient Represenative (must comment) Choice offered to / list presented to : Spouse  Expected Discharge Plan and Services Expected Discharge Plan: [place] In-house Referral: Clinical Social Work   Post Acute Care Choice: [place] Living arrangements for the past 2 months: [place]                                      Prior Living Arrangements/Services Living arrangements for the past 2 months: Single Family Home Lives with:: Spouse Patient language and need for interpreter reviewed:: Yes Do you feel safe going back to the place where you live?: Yes      Need for Family Participation in Patient Care: Yes (Comment) Care giver support system in place?: Yes (comment) Current home services: DME,Home PT Criminal Activity/Legal Involvement Pertinent to Current Situation/Hospitalization: No - Comment as needed  Activities of Daily  Living Home Assistive Devices/Equipment: None ADL Screening (condition at time of admission) Patient's cognitive ability adequate to safely complete daily activities?: No Is the patient deaf or have difficulty hearing?: No Does the patient have difficulty seeing, even when wearing glasses/contacts?: No Does the patient have difficulty concentrating, remembering, or making decisions?: Yes Patient able to express need for assistance with ADLs?: Yes Does the patient have difficulty dressing or bathing?: Yes Independently performs ADLs?: No Communication: Independent Dressing (OT): Needs assistance Is this a change from baseline?: Pre-admission baseline Grooming: Independent Feeding: Independent Bathing: Needs assistance Toileting: Needs assistance In/Out Bed: Needs assistance,Dependent Walks in Home: Needs assistance,Dependent Does the patient have difficulty walking or climbing stairs?: Yes Weakness of Legs: Right Weakness of Arms/Hands: None  Permission Sought/Granted Permission sought to share information with : Chartered certified accountant granted to share information with : Yes, Verbal Permission Granted     Permission granted to share info w AGENCY: SNFs        Emotional Assessment       Orientation: : Oriented to Self,Oriented to Place,Oriented to Situation Alcohol / Substance Use: Not Applicable Psych Involvement: No (comment)  Admission diagnosis:  Acute confusion [R41.0] Physical deconditioning [R53.81] Failure to thrive in adult [R62.7] AKI (acute kidney injury) ([place]) [N17.9] Patient Active Problem List   Diagnosis Date Noted  . Failure to thrive in adult 10/02/2020  . Tibia/fibula fracture, right, sequela 10/02/2020  . Closed fracture of right tibia and fibula 10/02/2020  . AKI (acute kidney  injury) ([place]) 10/02/2020  . CKD stage G3b/A3, GFR 30-44 and albumin creatinine ratio >300 mg/g (HCC) 10/02/2020  . Immobility 10/02/2020  . Macular  degeneration, dry   . Acute urinary retention   . High blood pressure   . Frequent falls   . Obesity   . Physical deconditioning 10/01/2020  . Parkinsonism ([place]) 04/27/2019  . Compression fracture of L1 lumbar vertebra ([place]) 04/27/2019  . Tremor 02/19/2019  . Closed nondisplaced fracture of head of right radius 10/18/2016   PCP:  Celene Squibb, MD Pharmacy:   [facility] [place], [place] AT [place] [address] [place] [address] Phone: [phone] Fax: [phone]  [place], [address], [address] [place], [place] [address] Phone: [phone] Fax: [phone]     Social Determinants of Health (SDOH) Interventions    Readmission Risk Interventions No flowsheet data found.

## 2020-10-03 LAB — URINE CULTURE: Culture: NO GROWTH

## 2020-10-03 MED ORDER — ALPRAZOLAM 0.25 MG PO TABS
0.2500 mg | ORAL_TABLET | Freq: Three times a day (TID) | ORAL | Status: DC | PRN
  Administered 2020-10-03: 0.25 mg via ORAL
  Filled 2020-10-03: qty 1

## 2020-10-03 NOTE — Consult Note (Signed)
ORTHOPAEDIC CONSULTATION  REQUESTING PHYSICIAN: Murlean Iba, MD  ASSESSMENT AND PLAN: 74 y.o. female with the following: Right trimalleolar ankle fracture, injury sustained 09/28/20  Agree with admission to a medical service and provide consultation and follow along.  She will require ORIF of right ankle fracture, but does not need to remain inpatient until surgery.    - Weight Bearing Status/Activity: NWB RLE  - Additional recommended labs/tests: Preoperative clearance  -VTE Prophylaxis: As indicated by medicine team  - Pain control: PRN; limit use of narcotics; recommend strict elevation to improve swelling prior to surgery  - Follow-up plan: Will request operative time on 10/11/20; anticipate outpatient surgery at 730 am  -Procedures: Operative fixation of right ankle fracture    Surgical Plan  Procedure:  ORIF R ankle fracture Disposition: Outpatient Anesthesia: Choice; may be good candidate for block and sedation Medical Comorbidities: Parkinson's, obesity, kidney disease Notes: Arthrex ankle set; likely locking plate lateral, 2 cannulated screws medially   Chief Complaint: Right ankle injury   HPI: Tonya Perez is a 73 y.o. female who sustained an injury to her right ankle on 12/8.  She got her ankle twisted between a curb and her vehicle.  She noted immediate pain and swelling in her ankle.  EMS took her to [facility] in [place] for evaluation.  She was placed in a splint after having her ankle reduced with sedation.  The plan was to have follow up scheduled as an outpatient.  However, in the following days she developed some confusion and ultimately presented to [facility] ED for evaluation and was noted to be dehydrated with an AKI.  She continued to have difficulty urinating and remains confused.  She has an underlying diagnosis of Parkinson's.  Pain is currently well controlled.    Past Medical History:  Diagnosis Date  . Anxiety   . Depression   . Frequent  falls   . High blood pressure   . Kidney disease   . Macular degeneration, dry   . Melanoma ([place])   . Obesity   . Osteoarthritis   . Resting tremor    left hand   Past Surgical History:  Procedure Laterality Date  . ABDOMINAL HYSTERECTOMY    . CHOLECYSTECTOMY     Social History   Socioeconomic History  . Marital status: Married    Spouse name: Not on file  . Number of children: 2  . Years of education: 48  . Highest education level: High school graduate  Occupational History  . Occupation: Retired  Tobacco Use  . Smoking status: Never Smoker  . Smokeless tobacco: Never Used  Substance and Sexual Activity  . Alcohol use: No  . Drug use: No  . Sexual activity: Not on file  Other Topics Concern  . Not on file  Social History Narrative   Lives at home with her husband.   Left-handed.   No caffeine use.   Social Determinants of Health   Financial Resource Strain: Not on file  Food Insecurity: Not on file  Transportation Needs: Not on file  Physical Activity: Not on file  Stress: Not on file  Social Connections: Not on file   Family History  Problem Relation Age of Onset  . Other Mother        ruptured gall bladder  . Heart attack Father    Allergies  Allergen Reactions  . Sinemet [Carbidopa W-Levodopa] Nausea And Vomiting  . Sulfa Antibiotics Swelling and Rash   Prior to Admission medications  Medication Sig Start Date End Date Taking? Authorizing Provider  acetaminophen (TYLENOL) 325 MG tablet Take 650 mg by mouth every 6 (six) hours as needed.   Yes [provider]  ALPRAZolam Duanne Moron) 0.5 MG tablet Take 0.5 mg by mouth at bedtime.  08/24/16  Yes [provider]  ARIPiprazole (ABILIFY) 5 MG tablet Take 5 mg by mouth daily.   Yes [provider]  buPROPion (WELLBUTRIN SR) 200 MG 12 hr tablet Take 400 mg by mouth daily.  08/31/16  Yes [provider]  Carbidopa-Levodopa ER (SINEMET CR) 25-100 MG tablet controlled release  Take 1 tablet by mouth 3 (three) times daily. 09/27/20  Yes [provider]  Cholecalciferol (VITAMIN D3 PO) Take 1,000 Units by mouth daily.   Yes [provider]  imipramine (TOFRANIL) 25 MG tablet Take 100 mg by mouth at bedtime. 08/23/16  Yes [provider]  lisinopril (ZESTRIL) 10 MG tablet Take 10 mg by mouth daily. 08/26/20  Yes [provider]  Multiple Vitamins-Minerals (PRESERVISION AREDS 2) CAPS Take 1 capsule by mouth daily.   Yes [provider]  ondansetron (ZOFRAN) 4 MG tablet Take 4 mg by mouth every 8 (eight) hours as needed. 09/27/20  Yes [provider]  HYDROcodone-acetaminophen (NORCO/VICODIN) 5-325 MG tablet Take 2 tablets by mouth every 6 (six) hours as needed for moderate pain. Patient not taking: No sig reported 09/28/20   Domenic Moras, PA-C   No results found. Family History Reviewed and non-contributory, no pertinent history of problems with bleeding or anesthesia      Review of Systems No fevers or chills No numbness or tingling No chest pain No shortness of breath + bladder dysfunction No GI distress No headaches   OBJECTIVE  Vitals:No data found. General: Alert, no acute distress; a little confused Cardiovascular: Warm extremities noted Respiratory: No cyanosis, no use of accessory musculature GI: No organomegaly, abdomen is soft and non-tender Skin: No lesions in the area of chief complaint; exposed skin Neurologic: Sensation intact distally to exposed toes Psychiatric: Patient is competent for consent with normal mood and affect Lymphatic: No swelling obvious and reported other than the area involved in the exam below Extremities   JIR:CVELFY is clean, dry and intact.  Toes are WWP.  Significant swelling and ecchymosis over dorsum of foot and anterior ankle.  +active motion TA/EHL    Test Results Imaging XR right ankle - injury films show displaced medial and lateral malleolus fractures, at the  level of the syndesmosis.  Small posterior malleolus fragment.  Improved alignment post reduction.  Persistent lateral displacement of talus in relation to tibia  Labs cbc Recent Labs    10/01/20 1543 10/02/20 0728  WBC 6.0 6.3  HGB 10.8* 10.3*  HCT 34.3* 32.8*  PLT 180 176    Labs inflam No results for input(s): CRP in the last 72 hours.  Invalid input(s): ESR  Labs coag No results for input(s): INR, PTT in the last 72 hours.  Invalid input(s): PT  Recent Labs    10/01/20 1543 10/02/20 0728  NA 138 138  K 4.2 4.1  CL 107 105  CO2 23 25  GLUCOSE 113* 97  BUN 36* 35*  CREATININE 2.22* 1.76*  CALCIUM 8.6* 8.5*

## 2020-10-03 NOTE — Plan of Care (Signed)
  Problem: Acute Rehab OT Goals (only OT should resolve) Goal: Pt. Will Perform Upper Body Bathing Flowsheets (Taken 10/03/2020 0913) Pt Will Perform Upper Body Bathing:  with modified independence  sitting Goal: Pt. Will Perform Lower Body Bathing Flowsheets (Taken 10/03/2020 0913) Pt Will Perform Lower Body Bathing:  with min assist  sit to/from stand  sitting/lateral leans Goal: Pt. Will Perform Upper Body Dressing Flowsheets (Taken 10/03/2020 0913) Pt Will Perform Upper Body Dressing:  with modified independence  sitting Goal: Pt. Will Perform Lower Body Dressing Flowsheets (Taken 10/03/2020 0913) Pt Will Perform Lower Body Dressing:  with min assist  sit to/from stand  sitting/lateral leans Goal: Pt. Will Transfer To Toilet Flowsheets (Taken 10/03/2020 0913) Pt Will Transfer to Toilet:  with min assist  bedside commode Note: While maintaining RLE weightbearing status. Goal: Pt. Will Perform Toileting-Clothing Manipulation Flowsheets (Taken 10/03/2020 0913) Pt Will Perform Toileting - Clothing Manipulation and hygiene:  with min assist  sit to/from stand

## 2020-10-03 NOTE — Evaluation (Signed)
Occupational Therapy Evaluation Patient Details Name: Tonya Perez MRN: [id number] DOB: 1946-09-19 Today's Date: 10/03/2020    History of Present Illness 74 y.o. female, medical history of Parkinson disease, obesity, anxiety and depression, patient lives at home with her husband, usually she is requiring walker or cane for ambulation, patient with accidental fall last Wednesday, 09/28/2020, for which she sustained left ankle fracture, and reduction in ED, and she was discharged home with outpatient follow-up with orthopedic Dr. Aline Brochure for this coming Thursday, per husband patient has been bedbound since, was not able to stand up or to ambulate, for last several days, he reports she is too large and too difficult to ambulate, he does report she was altered and confused yesterday, as well reports she has some urinary incontinence and difficulty urinating as well, PT has been arranged by her PCP as home health physical therapy went to evaluate her today, was turned about her and directed her to come to ED.  In ED mentation at baseline, creatinine at 2.2 which is around her baseline, she had urinary retention required in and out with 400 cc output,[facility] hospitalist consulted to admit as patient is unsafe for discharge home.   Clinical Impression   Patient awake and in bed agreeable to participate in OT evaluation. Husband present during evaluation and provided background information regarding functional performance during ADL tasks. Based on PT evaluation note, patient did much better this date with maintaining NWB status and ability to standing at EOB with RW for a short amount of time. Patient demonstrates functional UB strength to participate in self care tasks. Pt also participated in using bed rails to scoot towards the head of the bed for better positioning. Patient would benefit from continued OT services to increase standing and sitting balance while increasing ability to participate in self care tasks  with less assistance. Recommend discharge to SNF prior to returning home in order to ensure a safer transition of care with husband at home.     Follow Up Recommendations  SNF    Equipment Recommendations  None recommended by OT       Precautions / Restrictions Precautions Precautions: Fall Precaution Comments: patient reports 3 falls in the last six months Restrictions Weight Bearing Restrictions: Yes RLE Weight Bearing: Non weight bearing      Mobility Bed Mobility Overal bed mobility: Needs Assistance Bed Mobility: Supine to Sit;Sit to Supine     Supine to sit: Min assist;HOB elevated Sit to supine: Min assist   General bed mobility comments: requires direct direction to complete tasks. Assist with putting right leg back into bed.    Transfers Overall transfer level: Needs assistance Equipment used: Rolling walker (2 wheeled) Transfers: Sit to/from Stand Sit to Stand: Min assist;From elevated surface         General transfer comment: Patient was able to maintain NWB to RLE while standing at EOB with RW while therapist completed lower body hygeine.    Balance Overall balance assessment: History of Falls Sitting-balance support: Bilateral upper extremity supported;Feet supported Sitting balance-Leahy Scale: Fair     Standing balance support: Bilateral upper extremity supported;During functional activity Standing balance-Leahy Scale: Poor         ADL either performed or assessed with clinical judgement   ADL Overall ADL's : Needs assistance/impaired Eating/Feeding: Set up;Sitting   Grooming: Wash/dry face;Set up;Sitting   Upper Body Bathing: Set up;Sitting   Lower Body Bathing: Maximal assistance;Bed level;Sit to/from stand   Upper Body Dressing : Set up;Sitting  Lower Body Dressing: Maximal assistance;Cueing for sequencing;Sit to/from stand;Bed level     Toilet Transfer Details (indicate cue type and reason): transfer not completed this date           Vision Baseline Vision/History: No visual deficits Patient Visual Report: No change from baseline              Pertinent Vitals/Pain Pain Assessment: No/denies pain     Hand Dominance Left   Extremity/Trunk Assessment Upper Extremity Assessment Upper Extremity Assessment: Overall WFL for tasks assessed   Lower Extremity Assessment Lower Extremity Assessment: Defer to PT evaluation       Communication Communication Communication: No difficulties   Cognition Arousal/Alertness: Awake/alert Behavior During Therapy: WFL for tasks assessed/performed Overall Cognitive Status: History of cognitive impairments - at baseline                     Home Living Family/patient expects to be discharged to:: Skilled nursing facility Living Arrangements: Spouse/significant other Available Help at Discharge: Family;Available PRN/intermittently Type of Home: House Home Access: Stairs to enter [employer] of Steps: 3 Entrance Stairs-Rails: Left;Right;Can reach both Home Layout: One level     Bathroom Shower/Tub: Walk-in shower;Door   Bathroom Toilet: Handicapped height Bathroom Accessibility: Yes   Home Equipment: Grab bars - toilet;Wheelchair - manual;Tub bench;Hand held shower head;Grab bars - tub/shower;Walker - 4 wheels          Prior Functioning/Environment Level of Independence: Needs assistance  Gait / Transfers Assistance Needed: Patient would ambulate without assistive devices in home; ambulated with RW in the community and husband required to supervise her for safety. ADL's / Homemaking Assistance Needed: Prior to ankle fracture, patient was able to bath and dress herself with husband assisting with donning bra.            OT Problem List: Decreased safety awareness;Impaired balance (sitting and/or standing);Decreased activity tolerance;Decreased knowledge of precautions;Decreased knowledge of use of DME or AE      OT  Treatment/Interventions: Self-care/ADL training;Therapeutic exercise;Therapeutic activities;Energy conservation;DME and/or AE instruction;Patient/family education;Balance training;Modalities    OT Goals(Current goals can be found in the care plan section) Acute Rehab OT Goals Patient Stated Goal: None stated OT Goal Formulation: With patient Time For Goal Achievement: 10/17/20 Potential to Achieve Goals: Good  OT Frequency: Min 2X/week    AM-PAC OT "6 Clicks" Daily Activity     Outcome Measure Help from another person eating meals?: None Help from another person taking care of personal grooming?: A Little Help from another person toileting, which includes using toliet, bedpan, or urinal?: Total Help from another person bathing (including washing, rinsing, drying)?: A Lot Help from another person to put on and taking off regular upper body clothing?: A Little Help from another person to put on and taking off regular lower body clothing?: Total 6 Click Score: 14   End of Session Equipment Utilized During Treatment: Gait belt;Rolling walker  Activity Tolerance: Patient tolerated treatment well Patient left: in bed;with call bell/phone within reach;with bed alarm set;with family/visitor present  OT Visit Diagnosis: History of falling (Z91.81)                Time: 2694-8546 OT Time Calculation (min): 22 min Charges:  OT General Charges $OT Visit: 1 Visit OT Evaluation $OT Eval Low Complexity: 1 Low  Kalinda Romaniello, OTR/L,CBIS  [phone]   Janeece Blok, Candiace West 10/03/2020, 9:09 AM

## 2020-10-03 NOTE — Progress Notes (Signed)
PROGRESS NOTE   Tonya Perez  HUD:[id number] DOB: 1946-01-06 DOA: 10/01/2020 PCP: Celene Squibb, MD   Chief Complaint  Patient presents with  . Urinary Retention   Brief Admission History:  74 y.o. female, medical history of Parkinson disease, obesity, anxiety and depression, patient lives at home with her husband, usually she is requiring walker or cane for ambulation, patient with accidental fall last Wednesday, 09/28/2020, for which she sustained left ankle fracture, and reduction in ED, and she was discharged home with outpatient follow-up with orthopedic Dr. Aline Brochure for this coming Thursday, per husband patient has been bedbound since, was not able to stand up or to ambulate, for last several days, he reports she is too large and too difficult to ambulate, he does report she was altered and confused yesterday, as well reports she has some urinary incontinence and difficulty urinating as well, PT has been arranged by her PCP as an outpatient, physical therapy went to evaluate her today, was turned about her and directed her to come to ED.  In ED mentation at baseline, creatinine at 2.2 which is around her baseline, she had urinary retention required in and out with 400 cc output,[facility] hospitalist consulted to admit as patient is unsafe for discharge home.   Assessment & Plan:   Principal Problem:   Closed fracture of right tibia and fibula Active Problems:   Tremor   Parkinsonism (HCC)   Physical deconditioning   Failure to thrive in adult   Tibia/fibula fracture, right, sequela   AKI (acute kidney injury) ([place])   CKD stage G3b/A3, GFR 30-44 and albumin creatinine ratio >300 mg/g (HCC)   Immobility   Macular degeneration, dry   Acute urinary retention   High blood pressure   Frequent falls   Obesity  1. Right ankle tib/fib fracture -causing significant problems with mobility, patient unable to do ADLs without significant assistance.  Pt unable to get to an outpatient appt to see  orthopedics.  I spoke with orthopedist Dr. Amedeo Kinsman who agreed to see patient today for consultation.  He reported that patient was going to need surgery for fracture stabilization.   2. AKI on CKD stage 3b - creatinine improved after therapies.  3. HTN - resume home blood pressure lowering medication.  4. Physical debility - I have asked for an urgent PT evaluation for SNF placement.  5. Acute urinary retention - s/p I/O cath, will follow urine output.    DVT prophylaxis: SQ heparin  Code Status: Full  Family Communication:  Disposition: TBD, anticipating SNF   Status is: Inpatient   The patient will require care spanning > 2 midnights and should be moved to inpatient because: Unsafe d/c plan and Inpatient level of care appropriate due to severity of illness. Orthopedics evaluating later today and likely will need surgical management for fracture stabilization.    Dispo: The patient is from: Home              Anticipated d/c is to: SNF              Anticipated d/c date is: 1 day              Patient currently is medically stable to d/c.  Consultants:   Orthopedics   Procedures:   n/a  Antimicrobials:  n/a  Subjective: Pt reports that she has a difficult time moving due to her ankle fracture.   Objective: Vitals:   10/02/20 0512 10/02/20 1900 10/02/20 2142 10/03/20 0618  BP: 115/82  130/60 (!) 154/60 118/71  Pulse: 95  88 82  Resp: 19 20 19 19   Temp: (!) 96.9 F (36.1 C) 98.8 F (37.1 C) 98.3 F (36.8 C) 99.1 F (37.3 C)  TempSrc:  Oral Oral Oral  SpO2: 98% 98% 98% 94%  Weight:      Height:        Intake/Output Summary (Last 24 hours) at 10/03/2020 1106 Last data filed at 10/03/2020 0900 Gross per 24 hour  Intake 840 ml  Output 1200 ml  Net -360 ml   Filed Weights   10/01/20 2029  Weight: 97.1 kg   Examination:  General exam: awake, alert, no distress, Appears calm and comfortable  Respiratory system: Clear to auscultation. Respiratory effort  normal. Cardiovascular system: S1 & S2 heard, RRR. No JVD, murmurs, rubs, gallops or clicks. No pedal edema. Gastrointestinal system: Abdomen is nondistended, soft and nontender. No organomegaly or masses felt. Normal bowel sounds heard. Central nervous system: Alert and oriented. No focal neurological deficits. Extremities: right ankle casted.  Skin: No rashes, lesions or ulcers Psychiatry: Judgement and insight appear normal. Mood & affect appropriate.   Data Reviewed: I have personally reviewed following labs and imaging studies  CBC: Recent Labs  Lab 10/01/20 1543 10/02/20 0728  WBC 6.0 6.3  NEUTROABS 4.4  --   HGB 10.8* 10.3*  HCT 34.3* 32.8*  MCV 106.2* 105.8*  PLT 180 741    Basic Metabolic Panel: Recent Labs  Lab 10/01/20 1543 10/02/20 0728  NA 138 138  K 4.2 4.1  CL 107 105  CO2 23 25  GLUCOSE 113* 97  BUN 36* 35*  CREATININE 2.22* 1.76*  CALCIUM 8.6* 8.5*    GFR: Estimated Creatinine Clearance: 32.1 mL/min (A) (by C-G formula based on SCr of 1.76 mg/dL (H)).  Liver Function Tests: No results for input(s): AST, ALT, ALKPHOS, BILITOT, PROT, ALBUMIN in the last 168 hours.  CBG: No results for input(s): GLUCAP in the last 168 hours.  Recent Results (from the past 240 hour(s))  Urine Culture     Status: None   Collection Time: 10/01/20  3:30 PM   Specimen: Urine, Clean Catch  Result Value Ref Range Status   Specimen Description   Final    URINE, CLEAN CATCH Performed at [facility], [address], [place], [place] [address]    Special Requests   Final    NONE Performed at [facility], [address], [place], [place] [address]    Culture   Final    NO GROWTH Performed at [facility], [place] [address] [place], [place] [address]    Report Status 10/03/2020 FINAL  Final  Resp Panel by RT-PCR (Flu A&B, Covid) Nasopharyngeal Swab     Status: None   Collection Time: 10/01/20  5:15 PM   Specimen: Nasopharyngeal Swab; Nasopharyngeal(NP) swabs in  vial transport medium  Result Value Ref Range Status   SARS Coronavirus 2 by RT PCR NEGATIVE NEGATIVE Final    Comment: (NOTE) SARS-CoV-2 target nucleic acids are NOT DETECTED.  The SARS-CoV-2 RNA is generally detectable in upper respiratory specimens during the acute phase of infection. The lowest concentration of SARS-CoV-2 viral copies this assay can detect is 138 copies/mL. A negative result does not preclude SARS-Cov-2 infection and should not be used as the sole basis for treatment or other patient management decisions. A negative result may occur with  improper specimen collection/handling, submission of specimen other than nasopharyngeal swab, presence of viral mutation(s) within the  areas targeted by this assay, and inadequate number of viral copies(<138 copies/mL). A negative result must be combined with clinical observations, patient history, and epidemiological information. The expected result is Negative.  Fact Sheet for Patients:  EntrepreneurPulse.com.au  Fact Sheet for Healthcare Providers:  IncredibleEmployment.be  This test is no t yet approved or cleared by the [place] FDA and  has been authorized for detection and/or diagnosis of SARS-CoV-2 by FDA under an Emergency Use Authorization (EUA). This EUA will remain  in effect (meaning this test can be used) for the duration of the COVID-19 declaration under Section 564(b)(1) of the Act, 21 U.S.C.section 360bbb-3(b)(1), unless the authorization is terminated  or revoked sooner.       Influenza A by PCR NEGATIVE NEGATIVE Final   Influenza B by PCR NEGATIVE NEGATIVE Final    Comment: (NOTE) The Xpert Xpress SARS-CoV-2/FLU/RSV plus assay is intended as an aid in the diagnosis of influenza from Nasopharyngeal swab specimens and should not be used as a sole basis for treatment. Nasal washings and aspirates are unacceptable for Xpert Xpress SARS-CoV-2/FLU/RSV testing.  Fact  Sheet for Patients: EntrepreneurPulse.com.au  Fact Sheet for Healthcare Providers: IncredibleEmployment.be  This test is not yet approved or cleared by the [place] FDA and has been authorized for detection and/or diagnosis of SARS-CoV-2 by FDA under an Emergency Use Authorization (EUA). This EUA will remain in effect (meaning this test can be used) for the duration of the COVID-19 declaration under Section 564(b)(1) of the Act, 21 U.S.C. section 360bbb-3(b)(1), unless the authorization is terminated or revoked.  Performed at [facility], [address] [place], [place] [address]      Radiology Studies: DG Ankle Complete Right  Result Date: 10/01/2020 CLINICAL DATA:  Evaluate alignment of right ankle fracture EXAM: RIGHT ANKLE - COMPLETE 3+ VIEW COMPARISON:  09/28/2020 FINDINGS: Distal fibular fracture with at least 8 mm lateral displacement of distal fracture fragment. Transverse fracture of the medial malleolus with estimated 8 mm lateral displacement of distal fracture fragment. There is worsening lateral subluxation of the talus with respect to the distal tibial articular surface. Minimally displaced posterior malleolar fracture. Small calcaneal spur. Overlying cast material obscures some detail. IMPRESSION: Trimalleolar fracture with worsening lateral subluxation of the talus with respect to the distal tibial articular surface. Electronically Signed   By: Lucrezia Europe M.D.   On: 10/01/2020 16:26   DG Chest Port 1 View  Result Date: 10/01/2020 CLINICAL DATA:  Preoperative EXAM: PORTABLE CHEST 1 VIEW COMPARISON:  None. FINDINGS: The heart size and mediastinal contours are within normal limits. Minimal diffuse interstitial pulmonary opacity. The visualized skeletal structures are unremarkable. IMPRESSION: Minimal diffuse interstitial pulmonary opacity, which may reflect mild edema, infection, and/or chronic interstitial change. No focal airspace  opacity. Electronically Signed   By: Eddie Candle M.D.   On: 10/01/2020 16:23   Scheduled Meds: . ALPRAZolam  0.5 mg Oral QHS  . ARIPiprazole  5 mg Oral Daily  . buPROPion  400 mg Oral Daily  . Carbidopa-Levodopa ER  1 tablet Oral TID  . cholecalciferol  1,000 Units Oral Daily  . heparin  5,000 Units Subcutaneous Q8H  . imipramine  25 mg Oral QHS  . lisinopril  10 mg Oral Daily  . multivitamin-lutein  1 capsule Oral Daily  . rOPINIRole  1 mg Oral TID   Continuous Infusions:   LOS: 1 day   Time spent: 35 mins   Ralph Brouwer Wynetta Emery, MD How to contact the [facility] Attending or Consulting  provider [place] or covering provider during after hours [place], for this patient?  1. Check the care team in [facility] and look for a) attending/consulting [facility] provider listed and b) the [facility] team listed 2. Log into [email or web address] and use [facility]'s universal password to access. If you do not have the password, please contact the hospital operator. 3. Locate the [facility] provider you are looking for under [facility] and page to a number that you can be directly reached. 4. If you still have difficulty reaching the provider, please page the [facility] (Director on Call) for the Hospitalists listed on amion for assistance.  10/03/2020, 11:06 AM

## 2020-10-03 NOTE — Progress Notes (Signed)
Physical Therapy Note  Patient Details  Name: Tonya Perez MRN: [id number] Date of Birth: 11-17-1945 Today's Date: 10/03/2020    Pt nonresponsive to therapist request for participation today.  Pt acknowledged then turned her head without any further communication.  Teena Irani, PTA/CLT [phone] 10/03/2020, 2:40PM

## 2020-10-03 NOTE — H&P (View-Only) (Signed)
 ORTHOPAEDIC CONSULTATION  REQUESTING PHYSICIAN: Johnson, Clanford L, MD  ASSESSMENT AND PLAN: 74 y.o. female with the following: Right trimalleolar ankle fracture, injury sustained 09/28/20  Agree with admission to a medical service and provide consultation and follow along.  She will require ORIF of right ankle fracture, but does not need to remain inpatient until surgery.    - Weight Bearing Status/Activity: NWB RLE  - Additional recommended labs/tests: Preoperative clearance  -VTE Prophylaxis: As indicated by medicine team  - Pain control: PRN; limit use of narcotics; recommend strict elevation to improve swelling prior to surgery  - Follow-up plan: Will request operative time on 10/11/20; anticipate outpatient surgery at 730 am  -Procedures: Operative fixation of right ankle fracture    Surgical Plan  Procedure:  ORIF R ankle fracture Disposition: Outpatient Anesthesia: Choice; may be good candidate for block and sedation Medical Comorbidities: Parkinson's, obesity, kidney disease Notes: Arthrex ankle set; likely locking plate lateral, 2 cannulated screws medially   Chief Complaint: Right ankle injury   HPI: Tonya Perez is a 74 y.o. female who sustained an injury to her right ankle on 12/8.  She got her ankle twisted between a curb and her vehicle.  She noted immediate pain and swelling in her ankle.  EMS took her to  in [place] for evaluation.  She was placed in a splint after having her ankle reduced with sedation.  The plan was to have follow up scheduled as an outpatient.  However, in the following days she developed some confusion and ultimately presented to [facility] ED for evaluation and was noted to be dehydrated with an AKI.  She continued to have difficulty urinating and remains confused.  She has an underlying diagnosis of Parkinson's.  Pain is currently well controlled.    Past Medical History:  Diagnosis Date  . Anxiety   . Depression   . Frequent  falls   . High blood pressure   . Kidney disease   . Macular degeneration, dry   . Melanoma (HCC)   . Obesity   . Osteoarthritis   . Resting tremor    left hand   Past Surgical History:  Procedure Laterality Date  . ABDOMINAL HYSTERECTOMY    . CHOLECYSTECTOMY     Social History   Socioeconomic History  . Marital status: Married    Spouse name: Not on file  . Number of children: 2  . Years of education: 12  . Highest education level: High school graduate  Occupational History  . Occupation: Retired  Tobacco Use  . Smoking status: Never Smoker  . Smokeless tobacco: Never Used  Substance and Sexual Activity  . Alcohol use: No  . Drug use: No  . Sexual activity: Not on file  Other Topics Concern  . Not on file  Social History Narrative   Lives at home with her husband.   Left-handed.   No caffeine use.   Social Determinants of Health   Financial Resource Strain: Not on file  Food Insecurity: Not on file  Transportation Needs: Not on file  Physical Activity: Not on file  Stress: Not on file  Social Connections: Not on file   Family History  Problem Relation Age of Onset  . Other Mother        ruptured gall bladder  . Heart attack Father    Allergies  Allergen Reactions  . Sinemet [Carbidopa W-Levodopa] Nausea And Vomiting  . Sulfa Antibiotics Swelling and Rash   Prior to Admission medications     Medication Sig Start Date End Date Taking? Authorizing Provider  acetaminophen (TYLENOL) 325 MG tablet Take 650 mg by mouth every 6 (six) hours as needed.   Yes [provider]  ALPRAZolam Duanne Moron) 0.5 MG tablet Take 0.5 mg by mouth at bedtime.  08/24/16  Yes [provider]  ARIPiprazole (ABILIFY) 5 MG tablet Take 5 mg by mouth daily.   Yes [provider]  buPROPion (WELLBUTRIN SR) 200 MG 12 hr tablet Take 400 mg by mouth daily.  08/31/16  Yes [provider]  Carbidopa-Levodopa ER (SINEMET CR) 25-100 MG tablet controlled release  Take 1 tablet by mouth 3 (three) times daily. 09/27/20  Yes [provider]  Cholecalciferol (VITAMIN D3 PO) Take 1,000 Units by mouth daily.   Yes [provider]  imipramine (TOFRANIL) 25 MG tablet Take 100 mg by mouth at bedtime. 08/23/16  Yes [provider]  lisinopril (ZESTRIL) 10 MG tablet Take 10 mg by mouth daily. 08/26/20  Yes [provider]  Multiple Vitamins-Minerals (PRESERVISION AREDS 2) CAPS Take 1 capsule by mouth daily.   Yes [provider]  ondansetron (ZOFRAN) 4 MG tablet Take 4 mg by mouth every 8 (eight) hours as needed. 09/27/20  Yes [provider]  HYDROcodone-acetaminophen (NORCO/VICODIN) 5-325 MG tablet Take 2 tablets by mouth every 6 (six) hours as needed for moderate pain. Patient not taking: No sig reported 09/28/20   Domenic Moras, PA-C   No results found. Family History Reviewed and non-contributory, no pertinent history of problems with bleeding or anesthesia      Review of Systems No fevers or chills No numbness or tingling No chest pain No shortness of breath + bladder dysfunction No GI distress No headaches   OBJECTIVE  Vitals:No data found. General: Alert, no acute distress; a little confused Cardiovascular: Warm extremities noted Respiratory: No cyanosis, no use of accessory musculature GI: No organomegaly, abdomen is soft and non-tender Skin: No lesions in the area of chief complaint; exposed skin Neurologic: Sensation intact distally to exposed toes Psychiatric: Patient is competent for consent with normal mood and affect Lymphatic: No swelling obvious and reported other than the area involved in the exam below Extremities   JIR:CVELFY is clean, dry and intact.  Toes are WWP.  Significant swelling and ecchymosis over dorsum of foot and anterior ankle.  +active motion TA/EHL    Test Results Imaging XR right ankle - injury films show displaced medial and lateral malleolus fractures, at the  level of the syndesmosis.  Small posterior malleolus fragment.  Improved alignment post reduction.  Persistent lateral displacement of talus in relation to tibia  Labs cbc Recent Labs    10/01/20 1543 10/02/20 0728  WBC 6.0 6.3  HGB 10.8* 10.3*  HCT 34.3* 32.8*  PLT 180 176    Labs inflam No results for input(s): CRP in the last 72 hours.  Invalid input(s): ESR  Labs coag No results for input(s): INR, PTT in the last 72 hours.  Invalid input(s): PT  Recent Labs    10/01/20 1543 10/02/20 0728  NA 138 138  K 4.2 4.1  CL 107 105  CO2 23 25  GLUCOSE 113* 97  BUN 36* 35*  CREATININE 2.22* 1.76*  CALCIUM 8.6* 8.5*

## 2020-10-04 DIAGNOSIS — Z888 Allergy status to other drugs, medicaments and biological substances status: Secondary | ICD-10-CM | POA: Diagnosis not present

## 2020-10-04 DIAGNOSIS — S82891A Other fracture of right lower leg, initial encounter for closed fracture: Secondary | ICD-10-CM | POA: Diagnosis not present

## 2020-10-04 DIAGNOSIS — N179 Acute kidney failure, unspecified: Secondary | ICD-10-CM | POA: Diagnosis not present

## 2020-10-04 DIAGNOSIS — I959 Hypotension, unspecified: Secondary | ICD-10-CM | POA: Diagnosis not present

## 2020-10-04 DIAGNOSIS — M6281 Muscle weakness (generalized): Secondary | ICD-10-CM | POA: Diagnosis not present

## 2020-10-04 DIAGNOSIS — S82401S Unspecified fracture of shaft of right fibula, sequela: Secondary | ICD-10-CM | POA: Diagnosis not present

## 2020-10-04 DIAGNOSIS — S82391D Other fracture of lower end of right tibia, subsequent encounter for closed fracture with routine healing: Secondary | ICD-10-CM | POA: Diagnosis not present

## 2020-10-04 DIAGNOSIS — S82401D Unspecified fracture of shaft of right fibula, subsequent encounter for closed fracture with routine healing: Secondary | ICD-10-CM | POA: Diagnosis not present

## 2020-10-04 DIAGNOSIS — S82891D Other fracture of right lower leg, subsequent encounter for closed fracture with routine healing: Secondary | ICD-10-CM | POA: Diagnosis not present

## 2020-10-04 DIAGNOSIS — Z882 Allergy status to sulfonamides status: Secondary | ICD-10-CM | POA: Diagnosis not present

## 2020-10-04 DIAGNOSIS — N189 Chronic kidney disease, unspecified: Secondary | ICD-10-CM | POA: Diagnosis not present

## 2020-10-04 DIAGNOSIS — G8918 Other acute postprocedural pain: Secondary | ICD-10-CM | POA: Diagnosis not present

## 2020-10-04 DIAGNOSIS — R279 Unspecified lack of coordination: Secondary | ICD-10-CM | POA: Diagnosis not present

## 2020-10-04 DIAGNOSIS — Z79899 Other long term (current) drug therapy: Secondary | ICD-10-CM | POA: Diagnosis not present

## 2020-10-04 DIAGNOSIS — X501XXA Overexertion from prolonged static or awkward postures, initial encounter: Secondary | ICD-10-CM | POA: Diagnosis not present

## 2020-10-04 DIAGNOSIS — I129 Hypertensive chronic kidney disease with stage 1 through stage 4 chronic kidney disease, or unspecified chronic kidney disease: Secondary | ICD-10-CM | POA: Diagnosis not present

## 2020-10-04 DIAGNOSIS — R251 Tremor, unspecified: Secondary | ICD-10-CM | POA: Diagnosis not present

## 2020-10-04 DIAGNOSIS — R41841 Cognitive communication deficit: Secondary | ICD-10-CM | POA: Diagnosis not present

## 2020-10-04 DIAGNOSIS — F32A Depression, unspecified: Secondary | ICD-10-CM | POA: Diagnosis not present

## 2020-10-04 DIAGNOSIS — Z4789 Encounter for other orthopedic aftercare: Secondary | ICD-10-CM | POA: Diagnosis not present

## 2020-10-04 DIAGNOSIS — G20 Parkinson's disease: Secondary | ICD-10-CM | POA: Diagnosis not present

## 2020-10-04 DIAGNOSIS — H3530 Unspecified macular degeneration: Secondary | ICD-10-CM | POA: Diagnosis not present

## 2020-10-04 DIAGNOSIS — Z20822 Contact with and (suspected) exposure to covid-19: Secondary | ICD-10-CM | POA: Diagnosis not present

## 2020-10-04 DIAGNOSIS — S82851A Displaced trimalleolar fracture of right lower leg, initial encounter for closed fracture: Secondary | ICD-10-CM | POA: Diagnosis not present

## 2020-10-04 DIAGNOSIS — E669 Obesity, unspecified: Secondary | ICD-10-CM | POA: Diagnosis not present

## 2020-10-04 DIAGNOSIS — I10 Essential (primary) hypertension: Secondary | ICD-10-CM | POA: Diagnosis not present

## 2020-10-04 DIAGNOSIS — Z7401 Bed confinement status: Secondary | ICD-10-CM | POA: Diagnosis not present

## 2020-10-04 DIAGNOSIS — R2689 Other abnormalities of gait and mobility: Secondary | ICD-10-CM | POA: Diagnosis not present

## 2020-10-04 DIAGNOSIS — E538 Deficiency of other specified B group vitamins: Secondary | ICD-10-CM | POA: Diagnosis not present

## 2020-10-04 DIAGNOSIS — S82854A Nondisplaced trimalleolar fracture of right lower leg, initial encounter for closed fracture: Secondary | ICD-10-CM | POA: Diagnosis not present

## 2020-10-04 DIAGNOSIS — R627 Adult failure to thrive: Secondary | ICD-10-CM | POA: Diagnosis not present

## 2020-10-04 DIAGNOSIS — S82201D Unspecified fracture of shaft of right tibia, subsequent encounter for closed fracture with routine healing: Secondary | ICD-10-CM | POA: Diagnosis not present

## 2020-10-04 DIAGNOSIS — F418 Other specified anxiety disorders: Secondary | ICD-10-CM | POA: Diagnosis not present

## 2020-10-04 DIAGNOSIS — F419 Anxiety disorder, unspecified: Secondary | ICD-10-CM | POA: Diagnosis not present

## 2020-10-04 DIAGNOSIS — S82831D Other fracture of upper and lower end of right fibula, subsequent encounter for closed fracture with routine healing: Secondary | ICD-10-CM | POA: Diagnosis not present

## 2020-10-04 DIAGNOSIS — M1990 Unspecified osteoarthritis, unspecified site: Secondary | ICD-10-CM | POA: Diagnosis not present

## 2020-10-04 DIAGNOSIS — F339 Major depressive disorder, recurrent, unspecified: Secondary | ICD-10-CM | POA: Diagnosis not present

## 2020-10-04 DIAGNOSIS — R338 Other retention of urine: Secondary | ICD-10-CM | POA: Diagnosis not present

## 2020-10-04 DIAGNOSIS — N1832 Chronic kidney disease, stage 3b: Secondary | ICD-10-CM | POA: Diagnosis not present

## 2020-10-04 DIAGNOSIS — S82201S Unspecified fracture of shaft of right tibia, sequela: Secondary | ICD-10-CM | POA: Diagnosis not present

## 2020-10-04 MED ORDER — CHLORHEXIDINE GLUCONATE CLOTH 2 % EX PADS
6.0000 | MEDICATED_PAD | Freq: Every day | CUTANEOUS | Status: DC
  Administered 2020-10-04: 6 via TOPICAL

## 2020-10-04 MED ORDER — ACETAMINOPHEN 325 MG PO TABS: 650.0000 mg | ORAL_TABLET | Freq: Four times a day (QID) | ORAL | Status: DC

## 2020-10-04 MED ORDER — CYANOCOBALAMIN 1000 MCG/ML IJ SOLN
1000.0000 ug | Freq: Once | INTRAMUSCULAR | Status: AC
  Administered 2020-10-04: 1000 ug via INTRAMUSCULAR
  Filled 2020-10-04: qty 1

## 2020-10-04 MED ORDER — CYANOCOBALAMIN 2000 MCG PO TABS: 4000.0000 ug | ORAL_TABLET | Freq: Every day | ORAL | Status: AC

## 2020-10-04 MED ORDER — ALPRAZOLAM 0.5 MG PO TABS: 0.5000 mg | ORAL_TABLET | Freq: Every day | ORAL | 0 refills | Status: AC

## 2020-10-04 NOTE — Discharge Summary (Signed)
Physician Discharge Summary  Tonya Perez TMH:[id number] DOB: 01-25-1946 DOA: 10/01/2020  PCP: Celene Squibb, MD Orthopedics:  Dr. Sherald Hess  Admit date: 10/01/2020 Discharge date: 10/04/2020  Admitted From:  Home  Disposition:  SNF   Recommendations for Outpatient Follow-up:  1. Follow up with orthopedics Dr. Amedeo Kinsman for planned outpatient surgery on 12/21 2. Follow up with PCP in 2 weeks 3. Please give vitamin B12 injection 1000 mcg weekly x 3.   4. Please remove foley in 1 week and do voiding trial   Discharge Condition: STABLE   CODE STATUS: FULL    Brief Hospitalization Summary: Please see all hospital notes, images, labs for full details of the hospitalization. ADMISSION HPI:  Tonya Perez  is a 74 y.o. female, medical history of Parkinson disease, obesity, anxiety and depression, patient lives at home with her husband, usually she is requiring walker or cane for ambulation, patient with accidental fall last Wednesday, 09/28/2020, for which she sustained left ankle fracture, and reduction in ED, and she was discharged home with outpatient follow-up with orthopedic Dr. Aline Brochure for this coming Thursday, per husband patient has been bedbound since, was not able to stand up or to ambulate, for last several days, he reports she is too large and too difficult to ambulate, he does report she was altered and confused yesterday, as well reports she has some urinary incontinence and difficulty urinating as well, PT has been arranged by her PCP as an outpatient, physical therapy went to evaluate her today, was turned about her and directed her to come to ED. -in ED mentation at baseline, creatinine at 2.2 which is around her baseline, she had urinary retention required in and out with 400 cc output,[facility] hospitalist consulted to admit as patient is unsafe for discharge home.   HOSPITAL COURSE  1. Right ankle tib/fib fracture -causing significant problems with mobility, patient unable to do ADLs  without significant assistance.  Pt unable to get to an outpatient appt to see orthopedics.  I spoke with orthopedist Dr. Amedeo Kinsman who agreed to see patient for consultation.  He reported that patient was going to need surgery for fracture stabilization.  He says that patient is scheduled to have surgery on 12/21 outpatient and can discharge to SNF with instructions for nonweightbearing to RLE and elevated extremity as much as possible. 2. B12 deficiency - B12 injection given x 1 on 12/14.  Recommend weekly B12 injection x 3 more doses.   3. AKI on CKD stage 3b - creatinine improved after therapies.  4. HTN - resume home blood pressure lowering medication.  5. Physical debility - I have asked for an urgent PT evaluation for SNF placement.  6. Acute urinary retention - s/p foley placement as she continues to require I/O caths.  Please remove foley in 1 week and do voiding trial.     DVT prophylaxis: SQ heparin  Code Status: Full  Family Communication: husband at bedside Disposition: SNF   ORTHOPEDICS CONSULTATION NOTES DR. Sherald Hess ASSESSMENT AND PLAN: 74 y.o. female with the following: Right trimalleolar ankle fracture, injury sustained 09/28/20  Agree with admission to a medical service and provide consultation and follow along.  She will require ORIF of right ankle fracture, but does not need to remain inpatient until surgery.    - Weight Bearing Status/Activity: NWB RLE  - Additional recommended labs/tests: Preoperative clearance  -VTE Prophylaxis: As indicated by medicine team  - Pain control: PRN; limit use of narcotics; recommend strict elevation to  improve swelling prior to surgery  - Follow-up plan: Will request operative time on 10/11/20; anticipate outpatient surgery at 730 am  -Procedures: Operative fixation of right ankle fracture   Surgical Plan  Procedure:  ORIF R ankle fracture Disposition: Outpatient Anesthesia: Choice; may be good candidate for block and  sedation Medical Comorbidities: Parkinson's, obesity, kidney disease Notes: Arthrex ankle set; likely locking plate lateral, 2 cannulated screws medially    Discharge Diagnoses:  Principal Problem:   Closed fracture of right tibia and fibula Active Problems:   Tremor   Parkinsonism ([place])   Physical deconditioning   Failure to thrive in adult   Tibia/fibula fracture, right, sequela   AKI (acute kidney injury) ([place])   CKD stage G3b/A3, GFR 30-44 and albumin creatinine ratio >300 mg/g (HCC)   Immobility   Macular degeneration, dry   Acute urinary retention   High blood pressure   Frequent falls   Obesity   Discharge Instructions:  Allergies as of 10/04/2020      Reactions   Sinemet [carbidopa W-levodopa] Nausea And Vomiting   Sulfa Antibiotics Swelling, Rash      Medication List    STOP taking these medications   HYDROcodone-acetaminophen 5-325 MG tablet Commonly known as: NORCO/VICODIN     TAKE these medications   acetaminophen 325 MG tablet Commonly known as: TYLENOL Take 2 tablets (650 mg total) by mouth every 6 (six) hours. What changed:   when to take this  reasons to take this   ALPRAZolam 0.5 MG tablet Commonly known as: XANAX Take 1 tablet (0.5 mg total) by mouth at bedtime.   ARIPiprazole 5 MG tablet Commonly known as: ABILIFY Take 5 mg by mouth daily.   buPROPion 200 MG 12 hr tablet Commonly known as: WELLBUTRIN SR Take 400 mg by mouth daily.   Carbidopa-Levodopa ER 25-100 MG tablet controlled release Commonly known as: SINEMET CR Take 1 tablet by mouth 3 (three) times daily.   cyanocobalamin 2000 MCG tablet Commonly known as: [facility]S VITAMIN B12 Take 2 tablets (4,000 mcg total) by mouth daily.   imipramine 25 MG tablet Commonly known as: TOFRANIL Take 100 mg by mouth at bedtime.   lisinopril 10 MG tablet Commonly known as: ZESTRIL Take 10 mg by mouth daily.   ondansetron 4 MG tablet Commonly known as: ZOFRAN Take 4 mg by mouth every  8 (eight) hours as needed.   PreserVision AREDS 2 Caps Take 1 capsule by mouth daily.   VITAMIN D3 PO Take 1,000 Units by mouth daily.       Contact information for follow-up providers    [name], MD. Go on 10/11/2020.   Specialties: Orthopedic Surgery, Sports Medicine Why: scheduled surgery as arranged by Dr. Waynette Buttery information: [place]. [address] [place] [address] [phone]            Contact information for after-discharge care    [facility] .   Service: Skilled Nursing Contact information: [place] [phone]                 Allergies  Allergen Reactions  . Sinemet [Carbidopa W-Levodopa] Nausea And Vomiting  . Sulfa Antibiotics Swelling and Rash   Allergies as of 10/04/2020      Reactions   Sinemet [carbidopa W-levodopa] Nausea And Vomiting   Sulfa Antibiotics Swelling, Rash      Medication List    STOP taking these medications   HYDROcodone-acetaminophen  5-325 MG tablet Commonly known as: NORCO/VICODIN     TAKE these medications   acetaminophen 325 MG tablet Commonly known as: TYLENOL Take 2 tablets (650 mg total) by mouth every 6 (six) hours. What changed:   when to take this  reasons to take this   ALPRAZolam 0.5 MG tablet Commonly known as: XANAX Take 1 tablet (0.5 mg total) by mouth at bedtime.   ARIPiprazole 5 MG tablet Commonly known as: ABILIFY Take 5 mg by mouth daily.   buPROPion 200 MG 12 hr tablet Commonly known as: WELLBUTRIN SR Take 400 mg by mouth daily.   Carbidopa-Levodopa ER 25-100 MG tablet controlled release Commonly known as: SINEMET CR Take 1 tablet by mouth 3 (three) times daily.   cyanocobalamin 2000 MCG tablet Commonly known as: CVS VITAMIN B12 Take 2 tablets (4,000 mcg total) by mouth daily.   imipramine 25 MG tablet Commonly known as: TOFRANIL Take 100 mg by mouth at bedtime.   lisinopril  10 MG tablet Commonly known as: ZESTRIL Take 10 mg by mouth daily.   ondansetron 4 MG tablet Commonly known as: ZOFRAN Take 4 mg by mouth every 8 (eight) hours as needed.   PreserVision AREDS 2 Caps Take 1 capsule by mouth daily.   VITAMIN D3 PO Take 1,000 Units by mouth daily.       Procedures/Studies: DG Ankle 2 Views Right  Result Date: 09/28/2020 CLINICAL DATA:  Ankle reduction EXAM: RIGHT ANKLE - 2 VIEW COMPARISON:  Earlier same day FINDINGS: Splint material obscures fine bony detail. Displaced fractures through the distal fibula and medial malleolus are again identified. Degree of displacement is improved. Additionally, there is improved alignment at the tibiotalar joint. IMPRESSION: Improved alignment post reduction. Electronically Signed   By: Macy Mis M.D.   On: 09/28/2020 16:53   DG Ankle Complete Right  Result Date: 10/01/2020 CLINICAL DATA:  Evaluate alignment of right ankle fracture EXAM: RIGHT ANKLE - COMPLETE 3+ VIEW COMPARISON:  09/28/2020 FINDINGS: Distal fibular fracture with at least 8 mm lateral displacement of distal fracture fragment. Transverse fracture of the medial malleolus with estimated 8 mm lateral displacement of distal fracture fragment. There is worsening lateral subluxation of the talus with respect to the distal tibial articular surface. Minimally displaced posterior malleolar fracture. Small calcaneal spur. Overlying cast material obscures some detail. IMPRESSION: Trimalleolar fracture with worsening lateral subluxation of the talus with respect to the distal tibial articular surface. Electronically Signed   By: Lucrezia Europe M.D.   On: 10/01/2020 16:26   DG Ankle Complete Right  Result Date: 09/28/2020 CLINICAL DATA:  Injury with pain and deformity. EXAM: RIGHT ANKLE - COMPLETE 3+ VIEW COMPARISON:  None. FINDINGS: Fracture subluxation at the ankle joint. Oblique fracture of the distal fibula. Transverse fracture of the medial malleolus. Posterior  lip of the tibia appears to be intact. Tibia subluxed medially relative to the talar dome. No talar fracture identified. Midfoot appears intact as seen. IMPRESSION: Fracture subluxation at the ankle joint. Oblique fracture of the distal fibula. Transverse fracture of the medial malleolus. Ankle joint subluxation. Electronically Signed   By: Nelson Chimes M.D.   On: 09/28/2020 15:12   DG Chest Port 1 View  Result Date: 10/01/2020 CLINICAL DATA:  Preoperative EXAM: PORTABLE CHEST 1 VIEW COMPARISON:  None. FINDINGS: The heart size and mediastinal contours are within normal limits. Minimal diffuse interstitial pulmonary opacity. The visualized skeletal structures are unremarkable. IMPRESSION: Minimal diffuse interstitial pulmonary opacity, which may reflect mild edema, infection, and/or  chronic interstitial change. No focal airspace opacity. Electronically Signed   By: Eddie Candle M.D.   On: 10/01/2020 16:23      Subjective: Pt has had difficult time sleeping in hospital.   Discharge Exam: Vitals:   10/03/20 2131 10/04/20 0616  BP: 135/69 140/63  Pulse: 89 80  Resp: 19   Temp: 98.5 F (36.9 C) 98.8 F (37.1 C)  SpO2: 94% 97%   Vitals:   10/02/20 2142 10/03/20 0618 10/03/20 2131 10/04/20 0616  BP: (!) 154/60 118/71 135/69 140/63  Pulse: 88 82 89 80  Resp: 19 19 19    Temp: 98.3 F (36.8 C) 99.1 F (37.3 C) 98.5 F (36.9 C) 98.8 F (37.1 C)  TempSrc: Oral Oral Oral Oral  SpO2: 98% 94% 94% 97%  Weight:      Height:       General: Pt is alert, confused, awake, not in acute distress Cardiovascular: normal S1/S2 +, no rubs, no gallops Respiratory: CTA bilaterally, no wheezing, no rhonchi Abdominal: Soft, NT, ND, bowel sounds + Extremities: casted right ankle foot, warm lower extremities, no cyanosis   The results of significant diagnostics from this hospitalization (including imaging, microbiology, ancillary and laboratory) are listed below for reference.     Microbiology: Recent  Results (from the past 240 hour(s))  Urine Culture     Status: None   Collection Time: 10/01/20  3:30 PM   Specimen: Urine, Clean Catch  Result Value Ref Range Status   Specimen Description   Final    URINE, CLEAN CATCH Performed at [facility], [address], [place], [place] [address]    Special Requests   Final    NONE Performed at [facility], [address] [place], [place] [address]    Culture   Final    NO GROWTH Performed at [facility], [place] [address], [place], [place] [address]    Report Status 10/03/2020 FINAL  Final  Resp Panel by RT-PCR (Flu A&B, Covid) Nasopharyngeal Swab     Status: None   Collection Time: 10/01/20  5:15 PM   Specimen: Nasopharyngeal Swab; Nasopharyngeal(NP) swabs in vial transport medium  Result Value Ref Range Status   SARS Coronavirus 2 by RT PCR NEGATIVE NEGATIVE Final    Comment: (NOTE) SARS-CoV-2 target nucleic acids are NOT DETECTED.  The SARS-CoV-2 RNA is generally detectable in upper respiratory specimens during the acute phase of infection. The lowest concentration of SARS-CoV-2 viral copies this assay can detect is 138 copies/mL. A negative result does not preclude SARS-Cov-2 infection and should not be used as the sole basis for treatment or other patient management decisions. A negative result may occur with  improper specimen collection/handling, submission of specimen other than nasopharyngeal swab, presence of viral mutation(s) within the areas targeted by this assay, and inadequate number of viral copies(<138 copies/mL). A negative result must be combined with clinical observations, patient history, and epidemiological information. The expected result is Negative.  Fact Sheet for Patients:  EntrepreneurPulse.com.au  Fact Sheet for Healthcare Providers:  IncredibleEmployment.be  This test is no t yet approved or cleared by the [place] FDA and  has been authorized for  detection and/or diagnosis of SARS-CoV-2 by FDA under an Emergency Use Authorization (EUA). This EUA will remain  in effect (meaning this test can be used) for the duration of the COVID-19 declaration under Section 564(b)(1) of the Act, 21 U.S.C.section 360bbb-3(b)(1), unless the authorization is terminated  or revoked sooner.  Influenza A by PCR NEGATIVE NEGATIVE Final   Influenza B by PCR NEGATIVE NEGATIVE Final    Comment: (NOTE) The Xpert Xpress SARS-CoV-2/FLU/RSV plus assay is intended as an aid in the diagnosis of influenza from Nasopharyngeal swab specimens and should not be used as a sole basis for treatment. Nasal washings and aspirates are unacceptable for Xpert Xpress SARS-CoV-2/FLU/RSV testing.  Fact Sheet for Patients: EntrepreneurPulse.com.au  Fact Sheet for Healthcare Providers: IncredibleEmployment.be  This test is not yet approved or cleared by the [place] FDA and has been authorized for detection and/or diagnosis of SARS-CoV-2 by FDA under an Emergency Use Authorization (EUA). This EUA will remain in effect (meaning this test can be used) for the duration of the COVID-19 declaration under Section 564(b)(1) of the Act, 21 U.S.C. section 360bbb-3(b)(1), unless the authorization is terminated or revoked.  Performed at [facility], [address], [place], [place] [address]      Labs: BNP (last 3 results) No results for input(s): BNP in the last 8760 hours. Basic Metabolic Panel: Recent Labs  Lab 10/01/20 1543 10/02/20 0728  NA 138 138  K 4.2 4.1  CL 107 105  CO2 23 25  GLUCOSE 113* 97  BUN 36* 35*  CREATININE 2.22* 1.76*  CALCIUM 8.6* 8.5*   Liver Function Tests: No results for input(s): AST, ALT, ALKPHOS, BILITOT, PROT, ALBUMIN in the last 168 hours. No results for input(s): LIPASE, AMYLASE in the last 168 hours. No results for input(s): AMMONIA in the last 168 hours. CBC: Recent Labs  Lab  10/01/20 1543 10/02/20 0728  WBC 6.0 6.3  NEUTROABS 4.4  --   HGB 10.8* 10.3*  HCT 34.3* 32.8*  MCV 106.2* 105.8*  PLT 180 176   Cardiac Enzymes: No results for input(s): CKTOTAL, CKMB, CKMBINDEX, TROPONINI in the last 168 hours. BNP: Invalid input(s): POCBNP CBG: No results for input(s): GLUCAP in the last 168 hours. D-Dimer No results for input(s): DDIMER in the last 72 hours. Hgb A1c No results for input(s): HGBA1C in the last 72 hours. Lipid Profile No results for input(s): CHOL, HDL, LDLCALC, TRIG, CHOLHDL, LDLDIRECT in the last 72 hours. Thyroid function studies No results for input(s): TSH, T4TOTAL, T3FREE, THYROIDAB in the last 72 hours.  Invalid input(s): FREET3 Anemia work up Recent Labs    10/01/20 1543  VITAMINB12 95*  FOLATE 18.0  FERRITIN 131  TIBC 198*  IRON 39  RETICCTPCT 1.7   Urinalysis    Component Value Date/Time   COLORURINE YELLOW 10/01/2020 1530   APPEARANCEUR CLEAR 10/01/2020 1530   LABSPEC 1.023 10/01/2020 1530   PHURINE 5.0 10/01/2020 1530   GLUCOSEU NEGATIVE 10/01/2020 1530   HGBUR NEGATIVE 10/01/2020 1530   BILIRUBINUR NEGATIVE 10/01/2020 1530   KETONESUR 5 (A) 10/01/2020 1530   PROTEINUR 30 (A) 10/01/2020 1530   NITRITE NEGATIVE 10/01/2020 1530   LEUKOCYTESUR NEGATIVE 10/01/2020 1530   Sepsis Labs Invalid input(s): PROCALCITONIN,  WBC,  LACTICIDVEN Microbiology Recent Results (from the past 240 hour(s))  Urine Culture     Status: None   Collection Time: 10/01/20  3:30 PM   Specimen: Urine, Clean Catch  Result Value Ref Range Status   Specimen Description   Final    URINE, CLEAN CATCH Performed at [facility], [address] [place], [place] [address]    Special Requests   Final    NONE Performed at [facility], [address], [place], [place] [address]    Culture   Final    NO GROWTH  Performed at [facility], [place] [address] [place], [place] [address]    Report Status 10/03/2020 FINAL  Final  Resp Panel by  RT-PCR (Flu A&B, Covid) Nasopharyngeal Swab     Status: None   Collection Time: 10/01/20  5:15 PM   Specimen: Nasopharyngeal Swab; Nasopharyngeal(NP) swabs in vial transport medium  Result Value Ref Range Status   SARS Coronavirus 2 by RT PCR NEGATIVE NEGATIVE Final    Comment: (NOTE) SARS-CoV-2 target nucleic acids are NOT DETECTED.  The SARS-CoV-2 RNA is generally detectable in upper respiratory specimens during the acute phase of infection. The lowest concentration of SARS-CoV-2 viral copies this assay can detect is 138 copies/mL. A negative result does not preclude SARS-Cov-2 infection and should not be used as the sole basis for treatment or other patient management decisions. A negative result may occur with  improper specimen collection/handling, submission of specimen other than nasopharyngeal swab, presence of viral mutation(s) within the areas targeted by this assay, and inadequate number of viral copies(<138 copies/mL). A negative result must be combined with clinical observations, patient history, and epidemiological information. The expected result is Negative.  Fact Sheet for Patients:  EntrepreneurPulse.com.au  Fact Sheet for Healthcare Providers:  IncredibleEmployment.be  This test is no t yet approved or cleared by the [place] FDA and  has been authorized for detection and/or diagnosis of SARS-CoV-2 by FDA under an Emergency Use Authorization (EUA). This EUA will remain  in effect (meaning this test can be used) for the duration of the COVID-19 declaration under Section 564(b)(1) of the Act, 21 U.S.C.section 360bbb-3(b)(1), unless the authorization is terminated  or revoked sooner.       Influenza A by PCR NEGATIVE NEGATIVE Final   Influenza B by PCR NEGATIVE NEGATIVE Final    Comment: (NOTE) The Xpert Xpress SARS-CoV-2/FLU/RSV plus assay is intended as an aid in the diagnosis of influenza from Nasopharyngeal swab  specimens and should not be used as a sole basis for treatment. Nasal washings and aspirates are unacceptable for Xpert Xpress SARS-CoV-2/FLU/RSV testing.  Fact Sheet for Patients: EntrepreneurPulse.com.au  Fact Sheet for Healthcare Providers: IncredibleEmployment.be  This test is not yet approved or cleared by the [place] FDA and has been authorized for detection and/or diagnosis of SARS-CoV-2 by FDA under an Emergency Use Authorization (EUA). This EUA will remain in effect (meaning this test can be used) for the duration of the COVID-19 declaration under Section 564(b)(1) of the Act, 21 U.S.C. section 360bbb-3(b)(1), unless the authorization is terminated or revoked.  Performed at [facility], [address], [place], [name] [address]     Time coordinating discharge: 38 mins   SIGNED:  Irwin Brakeman, MD  [facility] 10/04/2020, 11:48 AM How to contact the [facility] Attending or Consulting provider [employer] or covering provider during after hours [place], for this patient?  1. Check the care team in [facility] and look for a) attending/consulting [facility] provider listed and b) the [facility] team listed 2. Log into [email or web address] and use [place]'s universal password to access. If you do not have the password, please contact the hospital operator. 3. Locate the [facility] provider you are looking for under [facility] and page to a number that you can be directly reached. 4. If you still have difficulty reaching the provider, please page the [facility] (Director on Call) for the Hospitalists listed on amion for assistance.

## 2020-10-04 NOTE — Discharge Instructions (Signed)
Nonweight bearing to right lower extremity  recommend strict elevation of right lower extremity to improve swelling prior to surgery  Please follow up with orthopedics as scheduled for outpatient surgery on Tues 12/21.     IMPORTANT INFORMATION: PAY CLOSE ATTENTION   PHYSICIAN DISCHARGE INSTRUCTIONS  Follow with Primary care provider  Celene Squibb, MD  and other consultants as instructed by your Hospitalist Physician  Reader IF SYMPTOMS COME BACK, WORSEN OR NEW PROBLEM DEVELOPS   Please note: You were cared for by a hospitalist during your hospital stay. Every effort will be made to forward records to your primary care provider.  You can request that your primary care provider send for your hospital records if they have not received them.  Once you are discharged, your primary care physician will handle any further medical issues. Please note that NO REFILLS for any discharge medications will be authorized once you are discharged, as it is imperative that you return to your primary care physician (or establish a relationship with a primary care physician if you do not have one) for your post hospital discharge needs so that they can reassess your need for medications and monitor your lab values.  Please get a complete blood count and chemistry panel checked by your Primary MD at your next visit, and again as instructed by your Primary MD.  Get Medicines reviewed and adjusted: Please take all your medications with you for your next visit with your Primary MD  Laboratory/radiological data: Please request your Primary MD to go over all hospital tests and procedure/radiological results at the follow up, please ask your primary care provider to get all Hospital records sent to his/her office.  In some cases, they will be blood work, cultures and biopsy results pending at the time of your discharge. Please request that your primary care provider follow up on  these results.  If you are diabetic, please bring your blood sugar readings with you to your follow up appointment with primary care.    Please call and make your follow up appointments as soon as possible.    Also Note the following: If you experience worsening of your admission symptoms, develop shortness of breath, life threatening emergency, suicidal or homicidal thoughts you must seek medical attention immediately by calling 911 or calling your MD immediately  if symptoms less severe.  You must read complete instructions/literature along with all the possible adverse reactions/side effects for all the Medicines you take and that have been prescribed to you. Take any new Medicines after you have completely understood and accpet all the possible adverse reactions/side effects.   Do not drive when taking Pain medications or sleeping medications (Benzodiazepines)  Do not take more than prescribed Pain, Sleep and Anxiety Medications. It is not advisable to combine anxiety,sleep and pain medications without talking with your primary care practitioner  Special Instructions: If you have smoked or chewed Tobacco  in the last 2 yrs please stop smoking, stop any regular Alcohol  and or any Recreational drug use.  Wear Seat belts while driving.  Do not drive if taking any narcotic, mind altering or controlled substances or recreational drugs or alcohol.

## 2020-10-04 NOTE — Plan of Care (Signed)

## 2020-10-04 NOTE — TOC Transition Note (Signed)
Transition of Care [facility]) - CM/SW Discharge Note   Patient Details  Name: Tonya Perez MRN: [id number] Date of Birth: Jun 29, 1946  Transition of Care [facility] CM/SW Contact:  Boneta Lucks, RN Phone Number: 10/04/2020, 11:21 AM   Clinical Narrative:  Bed offer discussed with husband- [name]. Patient discharging to [facility] today. Patient has had COVID vaccines. RN/MD updated. [name] will provided number for report.     Final next level of care: Skilled Nursing Facility Barriers to Discharge: Continued Medical Work up   Patient Goals and CMS Choice Patient states their goals for this hospitalization and ongoing recovery are:: rehab CMS Medicare.gov Compare Post Acute Care list provided to:: Patient Represenative (must comment) Choice offered to / list presented to : Spouse  Discharge Placement              Patient chooses bed at: Other - please specify in the comment section below: ([facility]) Patient to be transferred to facility by: EMS Name of family member notified: [name] Patient and family notified of of transfer: 10/04/20  Discharge Plan and Services In-house Referral: Clinical Social Work   Post Acute Care Choice: [place]

## 2020-10-04 NOTE — Plan of Care (Signed)
  Problem: Education: Goal: Knowledge of General Education information will improve Description: Including pain rating scale, medication(s)/side effects and non-pharmacologic comfort measures 10/04/2020 1155 by Adam Phenix, RN Outcome: Adequate for Discharge 10/04/2020 1155 by Adam Phenix, RN Outcome: Adequate for Discharge   Problem: Health Behavior/Discharge Planning: Goal: Ability to manage health-related needs will improve 10/04/2020 1155 by Adam Phenix, RN Outcome: Adequate for Discharge 10/04/2020 1155 by Adam Phenix, RN Outcome: Adequate for Discharge   Problem: Clinical Measurements: Goal: Ability to maintain clinical measurements within normal limits will improve 10/04/2020 1155 by Adam Phenix, RN Outcome: Adequate for Discharge 10/04/2020 1155 by Adam Phenix, RN Outcome: Adequate for Discharge Goal: Will remain free from infection 10/04/2020 1155 by Adam Phenix, RN Outcome: Adequate for Discharge 10/04/2020 1155 by Adam Phenix, RN Outcome: Adequate for Discharge Goal: Diagnostic test results will improve 10/04/2020 1155 by Adam Phenix, RN Outcome: Adequate for Discharge 10/04/2020 1155 by Adam Phenix, RN Outcome: Adequate for Discharge Goal: Respiratory complications will improve 10/04/2020 1155 by Adam Phenix, RN Outcome: Adequate for Discharge 10/04/2020 1155 by Adam Phenix, RN Outcome: Adequate for Discharge Goal: Cardiovascular complication will be avoided 10/04/2020 1155 by Adam Phenix, RN Outcome: Adequate for Discharge 10/04/2020 1155 by Adam Phenix, RN Outcome: Adequate for Discharge   Problem: Activity: Goal: Risk for activity intolerance will decrease 10/04/2020 1155 by Adam Phenix, RN Outcome: Adequate for Discharge 10/04/2020 1155 by Adam Phenix, RN Outcome: Adequate for Discharge   Problem:  Nutrition: Goal: Adequate nutrition will be maintained 10/04/2020 1155 by Adam Phenix, RN Outcome: Adequate for Discharge 10/04/2020 1155 by Adam Phenix, RN Outcome: Adequate for Discharge   Problem: Coping: Goal: Level of anxiety will decrease 10/04/2020 1155 by Adam Phenix, RN Outcome: Adequate for Discharge 10/04/2020 1155 by Adam Phenix, RN Outcome: Adequate for Discharge   Problem: Elimination: Goal: Will not experience complications related to bowel motility 10/04/2020 1155 by Adam Phenix, RN Outcome: Adequate for Discharge 10/04/2020 1155 by Adam Phenix, RN Outcome: Adequate for Discharge Goal: Will not experience complications related to urinary retention 10/04/2020 1155 by Adam Phenix, RN Outcome: Adequate for Discharge 10/04/2020 1155 by Adam Phenix, RN Outcome: Adequate for Discharge   Problem: Pain Managment: Goal: General experience of comfort will improve 10/04/2020 1155 by Adam Phenix, RN Outcome: Adequate for Discharge 10/04/2020 1155 by Adam Phenix, RN Outcome: Adequate for Discharge   Problem: Safety: Goal: Ability to remain free from injury will improve 10/04/2020 1155 by Adam Phenix, RN Outcome: Adequate for Discharge 10/04/2020 1155 by Adam Phenix, RN Outcome: Adequate for Discharge   Problem: Skin Integrity: Goal: Risk for impaired skin integrity will decrease 10/04/2020 1155 by Adam Phenix, RN Outcome: Adequate for Discharge 10/04/2020 1155 by Adam Phenix, RN Outcome: Adequate for Discharge

## 2020-10-06 ENCOUNTER — Ambulatory Visit: Payer: Medicare Other | Admitting: Orthopedic Surgery

## 2020-10-06 DIAGNOSIS — E538 Deficiency of other specified B group vitamins: Secondary | ICD-10-CM | POA: Diagnosis not present

## 2020-10-06 DIAGNOSIS — G20 Parkinson's disease: Secondary | ICD-10-CM | POA: Diagnosis not present

## 2020-10-06 DIAGNOSIS — E669 Obesity, unspecified: Secondary | ICD-10-CM | POA: Diagnosis not present

## 2020-10-06 DIAGNOSIS — F419 Anxiety disorder, unspecified: Secondary | ICD-10-CM | POA: Diagnosis not present

## 2020-10-06 DIAGNOSIS — R338 Other retention of urine: Secondary | ICD-10-CM | POA: Diagnosis not present

## 2020-10-06 DIAGNOSIS — F32A Depression, unspecified: Secondary | ICD-10-CM | POA: Diagnosis not present

## 2020-10-06 DIAGNOSIS — S82891D Other fracture of right lower leg, subsequent encounter for closed fracture with routine healing: Secondary | ICD-10-CM | POA: Diagnosis not present

## 2020-10-06 DIAGNOSIS — I10 Essential (primary) hypertension: Secondary | ICD-10-CM | POA: Diagnosis not present

## 2020-10-07 ENCOUNTER — Other Ambulatory Visit (HOSPITAL_COMMUNITY): Admission: RE | Admit: 2020-10-07 | Payer: Medicare Other | Source: Ambulatory Visit

## 2020-10-07 ENCOUNTER — Encounter (HOSPITAL_COMMUNITY): Payer: Self-pay

## 2020-10-07 ENCOUNTER — Encounter (HOSPITAL_COMMUNITY)
Admission: RE | Admit: 2020-10-07 | Discharge: 2020-10-07 | Disposition: A | Discharge status: Home / Self Care | Payer: Medicare Other | Source: Ambulatory Visit | Attending: Orthopedic Surgery | Admitting: Orthopedic Surgery

## 2020-10-07 ENCOUNTER — Other Ambulatory Visit: Payer: Self-pay

## 2020-10-07 NOTE — Pre-Procedure Instructions (Signed)
Spoke with Sana (DON) of [facility] and spoke with her about preop arrival time, NPO after midnight and meds to take morning of procedure. She states patient is disoriented at times and husband will come the day of surgery to sign consent. I called [name], Husband who agrees with Hollice Gong and states he will be at the hospital by 0700 to sign consent and speak with anesthesia.

## 2020-10-11 ENCOUNTER — Ambulatory Visit (HOSPITAL_COMMUNITY): Payer: Medicare Other | Admitting: Anesthesiology

## 2020-10-11 ENCOUNTER — Ambulatory Visit (HOSPITAL_COMMUNITY): Payer: Medicare Other

## 2020-10-11 ENCOUNTER — Encounter (HOSPITAL_COMMUNITY): Payer: Self-pay | Admitting: Orthopedic Surgery

## 2020-10-11 ENCOUNTER — Encounter (HOSPITAL_COMMUNITY)
Admission: RE | Disposition: A | Discharge status: Home / Self Care | Payer: Self-pay | Source: Home / Self Care | Attending: Orthopedic Surgery

## 2020-10-11 ENCOUNTER — Ambulatory Visit (HOSPITAL_COMMUNITY)
Admission: RE | Admit: 2020-10-11 | Discharge: 2020-10-11 | Disposition: A | Discharge status: Home / Self Care | Payer: Medicare Other | Attending: Orthopedic Surgery | Admitting: Orthopedic Surgery

## 2020-10-11 DIAGNOSIS — Z20822 Contact with and (suspected) exposure to covid-19: Secondary | ICD-10-CM | POA: Diagnosis not present

## 2020-10-11 DIAGNOSIS — S82891A Other fracture of right lower leg, initial encounter for closed fracture: Secondary | ICD-10-CM

## 2020-10-11 DIAGNOSIS — S82854A Nondisplaced trimalleolar fracture of right lower leg, initial encounter for closed fracture: Secondary | ICD-10-CM | POA: Diagnosis not present

## 2020-10-11 DIAGNOSIS — F418 Other specified anxiety disorders: Secondary | ICD-10-CM | POA: Diagnosis not present

## 2020-10-11 DIAGNOSIS — Z79899 Other long term (current) drug therapy: Secondary | ICD-10-CM | POA: Diagnosis not present

## 2020-10-11 DIAGNOSIS — N189 Chronic kidney disease, unspecified: Secondary | ICD-10-CM | POA: Diagnosis not present

## 2020-10-11 DIAGNOSIS — S82851A Displaced trimalleolar fracture of right lower leg, initial encounter for closed fracture: Secondary | ICD-10-CM | POA: Insufficient documentation

## 2020-10-11 DIAGNOSIS — Z882 Allergy status to sulfonamides status: Secondary | ICD-10-CM | POA: Diagnosis not present

## 2020-10-11 DIAGNOSIS — Z888 Allergy status to other drugs, medicaments and biological substances status: Secondary | ICD-10-CM | POA: Diagnosis not present

## 2020-10-11 DIAGNOSIS — G8918 Other acute postprocedural pain: Secondary | ICD-10-CM | POA: Diagnosis not present

## 2020-10-11 DIAGNOSIS — Z4789 Encounter for other orthopedic aftercare: Secondary | ICD-10-CM | POA: Diagnosis not present

## 2020-10-11 DIAGNOSIS — X501XXA Overexertion from prolonged static or awkward postures, initial encounter: Secondary | ICD-10-CM | POA: Diagnosis not present

## 2020-10-11 DIAGNOSIS — I129 Hypertensive chronic kidney disease with stage 1 through stage 4 chronic kidney disease, or unspecified chronic kidney disease: Secondary | ICD-10-CM | POA: Diagnosis not present

## 2020-10-11 DIAGNOSIS — S82831D Other fracture of upper and lower end of right fibula, subsequent encounter for closed fracture with routine healing: Secondary | ICD-10-CM | POA: Diagnosis not present

## 2020-10-11 DIAGNOSIS — G20 Parkinson's disease: Secondary | ICD-10-CM | POA: Diagnosis not present

## 2020-10-11 DIAGNOSIS — S82391D Other fracture of lower end of right tibia, subsequent encounter for closed fracture with routine healing: Secondary | ICD-10-CM | POA: Diagnosis not present

## 2020-10-11 DIAGNOSIS — S82899A Other fracture of unspecified lower leg, initial encounter for closed fracture: Secondary | ICD-10-CM

## 2020-10-11 HISTORY — PX: ORIF ANKLE FRACTURE: SHX5408

## 2020-10-11 LAB — SARS CORONAVIRUS 2 BY RT PCR (HOSPITAL ORDER, PERFORMED IN ~~LOC~~ HOSPITAL LAB): SARS Coronavirus 2: NEGATIVE

## 2020-10-11 SURGERY — OPEN REDUCTION INTERNAL FIXATION (ORIF) ANKLE FRACTURE
Anesthesia: General | Site: Ankle | Laterality: Right

## 2020-10-11 MED ORDER — BUPIVACAINE-EPINEPHRINE (PF) 0.25% -1:200000 IJ SOLN
INTRAMUSCULAR | Status: DC | PRN
Start: 2020-10-11 — End: 2020-10-11

## 2020-10-11 MED ORDER — ONDANSETRON HCL 4 MG/2ML IJ SOLN
INTRAMUSCULAR | Status: AC
  Filled 2020-10-11: qty 2

## 2020-10-11 MED ORDER — DEXAMETHASONE SODIUM PHOSPHATE 4 MG/ML IJ SOLN
INTRAMUSCULAR | Status: AC
  Filled 2020-10-11: qty 2

## 2020-10-11 MED ORDER — LIDOCAINE HCL (PF) 1 % IJ SOLN
INTRAMUSCULAR | Status: AC
  Filled 2020-10-11: qty 30

## 2020-10-11 MED ORDER — CEFAZOLIN SODIUM-DEXTROSE 2-4 GM/100ML-% IV SOLN
2.0000 g | INTRAVENOUS | Status: AC
  Administered 2020-10-11: 2 g via INTRAVENOUS
  Filled 2020-10-11: qty 100

## 2020-10-11 MED ORDER — BUPIVACAINE-EPINEPHRINE (PF) 0.5% -1:200000 IJ SOLN
INTRAMUSCULAR | Status: AC
  Filled 2020-10-11: qty 30

## 2020-10-11 MED ORDER — LACTATED RINGERS IV SOLN: INTRAVENOUS | Status: DC | PRN

## 2020-10-11 MED ORDER — LIDOCAINE HCL (PF) 2 % IJ SOLN
INTRAMUSCULAR | Status: AC
  Filled 2020-10-11: qty 5

## 2020-10-11 MED ORDER — ROCURONIUM BROMIDE 10 MG/ML (PF) SYRINGE
PREFILLED_SYRINGE | INTRAVENOUS | Status: DC | PRN
  Administered 2020-10-11: 50 mg via INTRAVENOUS

## 2020-10-11 MED ORDER — SUGAMMADEX SODIUM 500 MG/5ML IV SOLN
INTRAVENOUS | Status: DC | PRN
  Administered 2020-10-11: 200 mg via INTRAVENOUS

## 2020-10-11 MED ORDER — DEXAMETHASONE SODIUM PHOSPHATE 10 MG/ML IJ SOLN
INTRAMUSCULAR | Status: DC | PRN
  Administered 2020-10-11: 5 mg via INTRAVENOUS

## 2020-10-11 MED ORDER — BUPIVACAINE-EPINEPHRINE (PF) 0.25% -1:200000 IJ SOLN
INTRAMUSCULAR | Status: DC | PRN
Start: 2020-10-11 — End: 2020-10-11
  Administered 2020-10-11: 14 mL via PERINEURAL
  Administered 2020-10-11: 15 mL via PERINEURAL

## 2020-10-11 MED ORDER — TRANEXAMIC ACID-NACL 1000-0.7 MG/100ML-% IV SOLN
INTRAVENOUS | Status: DC | PRN
  Administered 2020-10-11: 1000 mg via INTRAVENOUS

## 2020-10-11 MED ORDER — MIDAZOLAM HCL 2 MG/2ML IJ SOLN
2.0000 mg | Freq: Once | INTRAMUSCULAR | Status: AC
  Administered 2020-10-11: 2 mg via INTRAVENOUS
  Filled 2020-10-11: qty 2

## 2020-10-11 MED ORDER — BUPIVACAINE-EPINEPHRINE (PF) 0.25% -1:200000 IJ SOLN
INTRAMUSCULAR | Status: AC
  Filled 2020-10-11: qty 30

## 2020-10-11 MED ORDER — BUPIVACAINE HCL (PF) 0.5 % IJ SOLN
INTRAMUSCULAR | Status: DC | PRN
  Administered 2020-10-11: 14 mL via PERINEURAL

## 2020-10-11 MED ORDER — DEXAMETHASONE SODIUM PHOSPHATE 10 MG/ML IJ SOLN
INTRAMUSCULAR | Status: AC
  Filled 2020-10-11: qty 1

## 2020-10-11 MED ORDER — ORAL CARE MOUTH RINSE: 15.0000 mL | Freq: Once | OROMUCOSAL | Status: AC

## 2020-10-11 MED ORDER — LACTATED RINGERS IV SOLN
Freq: Once | INTRAVENOUS | Status: AC
  Administered 2020-10-11: 1000 mL via INTRAVENOUS

## 2020-10-11 MED ORDER — ASPIRIN EC 81 MG PO TBEC: 81.0000 mg | DELAYED_RELEASE_TABLET | Freq: Two times a day (BID) | ORAL | 0 refills | Status: AC

## 2020-10-11 MED ORDER — LIDOCAINE HCL (PF) 1 % IJ SOLN
INTRAMUSCULAR | Status: DC | PRN
  Administered 2020-10-11 (×2): 3 mL

## 2020-10-11 MED ORDER — HYDROMORPHONE HCL 1 MG/ML IJ SOLN: 0.2500 mg | INTRAMUSCULAR | Status: DC | PRN

## 2020-10-11 MED ORDER — 0.9 % SODIUM CHLORIDE (POUR BTL) OPTIME
TOPICAL | Status: DC | PRN
  Administered 2020-10-11: 1000 mL

## 2020-10-11 MED ORDER — ONDANSETRON HCL 4 MG/2ML IJ SOLN: 4.0000 mg | Freq: Once | INTRAMUSCULAR | Status: DC | PRN

## 2020-10-11 MED ORDER — FENTANYL CITRATE (PF) 250 MCG/5ML IJ SOLN
INTRAMUSCULAR | Status: AC
  Filled 2020-10-11: qty 5

## 2020-10-11 MED ORDER — PROPOFOL 10 MG/ML IV BOLUS
INTRAVENOUS | Status: AC
  Filled 2020-10-11: qty 20

## 2020-10-11 MED ORDER — BUPIVACAINE HCL (PF) 0.5 % IJ SOLN
INTRAMUSCULAR | Status: AC
  Filled 2020-10-11: qty 30

## 2020-10-11 MED ORDER — FENTANYL CITRATE (PF) 100 MCG/2ML IJ SOLN
INTRAMUSCULAR | Status: DC | PRN
  Administered 2020-10-11 (×2): 25 ug via INTRAVENOUS
  Administered 2020-10-11 (×2): 50 ug via INTRAVENOUS

## 2020-10-11 MED ORDER — GABAPENTIN 100 MG PO CAPS: 100.0000 mg | ORAL_CAPSULE | Freq: Three times a day (TID) | ORAL | 0 refills | Status: DC

## 2020-10-11 MED ORDER — PROPOFOL 10 MG/ML IV BOLUS
INTRAVENOUS | Status: DC | PRN
  Administered 2020-10-11: 170 mg via INTRAVENOUS

## 2020-10-11 MED ORDER — ONDANSETRON HCL 4 MG/2ML IJ SOLN
INTRAMUSCULAR | Status: DC | PRN
  Administered 2020-10-11: 4 mg via INTRAVENOUS

## 2020-10-11 MED ORDER — BUPIVACAINE-EPINEPHRINE (PF) 0.5% -1:200000 IJ SOLN
INTRAMUSCULAR | Status: DC | PRN
  Administered 2020-10-11: 25 mL

## 2020-10-11 MED ORDER — TRANEXAMIC ACID-NACL 1000-0.7 MG/100ML-% IV SOLN
INTRAVENOUS | Status: AC
  Filled 2020-10-11: qty 100

## 2020-10-11 MED ORDER — LIDOCAINE HCL (CARDIAC) PF 100 MG/5ML IV SOSY
PREFILLED_SYRINGE | INTRAVENOUS | Status: DC | PRN
  Administered 2020-10-11: 40 mg via INTRATRACHEAL

## 2020-10-11 MED ORDER — DEXAMETHASONE SODIUM PHOSPHATE 4 MG/ML IJ SOLN
INTRAMUSCULAR | Status: DC | PRN
  Administered 2020-10-11: 4 mg via PERINEURAL

## 2020-10-11 MED ORDER — ONDANSETRON HCL 4 MG PO TABS: 4.0000 mg | ORAL_TABLET | Freq: Three times a day (TID) | ORAL | 0 refills | Status: AC | PRN

## 2020-10-11 MED ORDER — OXYCODONE HCL 5 MG PO TABS: 5.0000 mg | ORAL_TABLET | ORAL | 0 refills | Status: AC | PRN

## 2020-10-11 MED ORDER — MELOXICAM 7.5 MG PO TABS: 7.5000 mg | ORAL_TABLET | Freq: Every day | ORAL | 0 refills | Status: AC

## 2020-10-11 MED ORDER — ACETAMINOPHEN 500 MG PO TABS: 1000.0000 mg | ORAL_TABLET | Freq: Three times a day (TID) | ORAL | 0 refills | Status: AC

## 2020-10-11 MED ORDER — CHLORHEXIDINE GLUCONATE 0.12 % MT SOLN
15.0000 mL | Freq: Once | OROMUCOSAL | Status: AC
  Administered 2020-10-11: 15 mL via OROMUCOSAL
  Filled 2020-10-11: qty 15

## 2020-10-11 SURGICAL SUPPLY — 88 items
APL PRP STRL LF DISP 70% ISPRP (MISCELLANEOUS) ×2
BANDAGE ELASTIC 3 LF NS (GAUZE/BANDAGES/DRESSINGS) ×4 IMPLANT
BANDAGE ESMARK 4X12 BL STRL LF (DISPOSABLE) ×1 IMPLANT
BIT DRILL 2 CANN GRADUATED (BIT) ×2 IMPLANT
BIT DRILL 2.5 CANN LNG (BIT) ×4 IMPLANT
BIT DRILL 2.6 CANN (BIT) ×2 IMPLANT
BLADE SURG SZ10 CARB STEEL (BLADE) ×2 IMPLANT
BNDG CMPR 12X4 ELC STRL LF (DISPOSABLE) ×1
BNDG CMPR MED 5X3 ELC HKLP NS (GAUZE/BANDAGES/DRESSINGS) ×2
BNDG CMPR STD VLCR NS LF 5.8X4 (GAUZE/BANDAGES/DRESSINGS) ×2
BNDG COHESIVE 4X5 TAN STRL (GAUZE/BANDAGES/DRESSINGS) ×2 IMPLANT
BNDG ELASTIC 4X5.8 VLCR NS LF (GAUZE/BANDAGES/DRESSINGS) ×4 IMPLANT
BNDG ESMARK 4X12 BLUE STRL LF (DISPOSABLE) ×2
CHLORAPREP W/TINT 26 (MISCELLANEOUS) ×4 IMPLANT
CLOTH BEACON ORANGE TIMEOUT ST (SAFETY) ×2 IMPLANT
COVER LIGHT HANDLE STERIS (MISCELLANEOUS) ×2 IMPLANT
COVER WAND RF STERILE (DRAPES) ×2 IMPLANT
CUFF TOURN SGL QUICK 34 (TOURNIQUET CUFF) ×2
CUFF TRNQT CYL 34X4.125X (TOURNIQUET CUFF) ×1 IMPLANT
DECANTER SPIKE VIAL GLASS SM (MISCELLANEOUS) IMPLANT
DRAPE C-ARM FOLDED MOBILE STRL (DRAPES) ×2 IMPLANT
DRAPE C-ARMOR (DRAPES) ×2 IMPLANT
DRAPE U-SHAPE 47X51 STRL (DRAPES) ×2 IMPLANT
DRILL 2.6X122MM WL AO SHAFT (BIT) IMPLANT
DRSG PAD ABDOMINAL 8X10 ST (GAUZE/BANDAGES/DRESSINGS) ×8 IMPLANT
ELECT REM PT RETURN 9FT ADLT (ELECTROSURGICAL) ×2
ELECTRODE REM PT RTRN 9FT ADLT (ELECTROSURGICAL) ×1 IMPLANT
GAUZE SPONGE 4X4 12PLY STRL (GAUZE/BANDAGES/DRESSINGS) ×2 IMPLANT
GAUZE XEROFORM 1X8 LF (GAUZE/BANDAGES/DRESSINGS) ×2 IMPLANT
GLOVE BIOGEL PI IND STRL 7.0 (GLOVE) ×5 IMPLANT
GLOVE BIOGEL PI IND STRL 8 (GLOVE) ×2 IMPLANT
GLOVE BIOGEL PI INDICATOR 7.0 (GLOVE) ×5
GLOVE BIOGEL PI INDICATOR 8 (GLOVE) ×2
GLOVE ECLIPSE 6.5 STRL STRAW (GLOVE) ×2 IMPLANT
GLOVE SKINSENSE NS SZ8.0 LF (GLOVE) ×2
GLOVE SKINSENSE STRL SZ8.0 LF (GLOVE) ×2 IMPLANT
GOWN STRL REUS W/ TWL XL LVL3 (GOWN DISPOSABLE) ×1 IMPLANT
GOWN STRL REUS W/TWL LRG LVL3 (GOWN DISPOSABLE) ×6 IMPLANT
GOWN STRL REUS W/TWL XL LVL3 (GOWN DISPOSABLE) ×2
GUIDEWIRE 1.35MM (WIRE) ×4 IMPLANT
INST SET MINOR BONE (KITS) ×2 IMPLANT
K-WIRE 1.6X150 (WIRE)
K-WIRE BB-TAK (WIRE) ×2
K-WIRE FX150X1.6XKRSH (WIRE)
K-WIRE ORTHOPEDIC 1.4X150L (WIRE)
K-WIRE SMOOTH 2.0X150 (WIRE)
KIT TURNOVER KIT A (KITS) ×2 IMPLANT
KWIRE BB-TAK (WIRE) ×1 IMPLANT
KWIRE FX150X1.6XKRSH (WIRE) IMPLANT
KWIRE ORTHOPEDIC 1.4X150L (WIRE) IMPLANT
KWIRE SMOOTH 2.0X150 (WIRE) IMPLANT
MANIFOLD NEPTUNE II (INSTRUMENTS) ×2 IMPLANT
NEEDLE HYPO 21X1.5 SAFETY (NEEDLE) ×2 IMPLANT
NS IRRIG 1000ML POUR BTL (IV SOLUTION) ×2 IMPLANT
PACK BASIC LIMB (CUSTOM PROCEDURE TRAY) ×2 IMPLANT
PAD ABD 5X9 TENDERSORB (GAUZE/BANDAGES/DRESSINGS) ×4 IMPLANT
PAD ABD 8X10 STRL (GAUZE/BANDAGES/DRESSINGS) ×2 IMPLANT
PAD ARMBOARD 7.5X6 YLW CONV (MISCELLANEOUS) ×2 IMPLANT
PAD CAST 4YDX4 CTTN HI CHSV (CAST SUPPLIES) ×1 IMPLANT
PADDING CAST COTTON 4X4 STRL (CAST SUPPLIES) ×2
PADDING WEBRIL 4 STERILE (GAUZE/BANDAGES/DRESSINGS) ×8 IMPLANT
PENCIL SMOKE EVACUATOR (MISCELLANEOUS) ×2 IMPLANT
PLATE FIBULA 6 HOLE RT LOCK (Plate) ×2 IMPLANT
SCREW CANC T15 FT 16X4XST (Screw) ×1 IMPLANT
SCREW CANCELLOUS 3MM 3X12MM (Screw) ×2 IMPLANT
SCREW CANCELLOUS 3X16MM (Screw) ×2 IMPLANT
SCREW CANCELLOUS 4.0X16MM (Screw) ×2 IMPLANT
SCREW CANN 4.0X50 (Screw) ×2 IMPLANT
SCREW CANN 4.0X60 (Screw) ×2 IMPLANT
SCREW CANN T15 ST 50X4 ST (Screw) ×1 IMPLANT
SCREW LOCKING 2.7X10 ANKLE (Screw) ×2 IMPLANT
SCREW LOCKING 2.7X12 ANKLE (Screw) ×4 IMPLANT
SCREW LOCKING 2.7X14MM (Screw) ×2 IMPLANT
SCREW LOW PROFILE 3.5X14 (Screw) ×4 IMPLANT
SCREW LOW PROFILE 4.0X40 (Screw) ×2 IMPLANT
SCREW NLOCK T15 FT 18X3.5XST (Screw) ×1 IMPLANT
SCREW NON LOCK 3.5X18MM (Screw) ×2 IMPLANT
SCREW NON-LOCKING 3.5X12MM (Screw) ×8 IMPLANT
SET BASIN LINEN APH (SET/KITS/TRAYS/PACK) ×2 IMPLANT
SPLINT PLASTER CAST XFAST 5X30 (CAST SUPPLIES) ×1 IMPLANT
SPLINT PLASTER XFAST SET 5X30 (CAST SUPPLIES) ×1
SPONGE LAP 18X18 RF (DISPOSABLE) ×2 IMPLANT
STAPLER VISISTAT 35W (STAPLE) ×2 IMPLANT
SUT ETHILON 3 0 FSL (SUTURE) ×4 IMPLANT
SUT MON AB 0 CT1 (SUTURE) ×2 IMPLANT
SUT MON AB 2-0 CT1 36 (SUTURE) ×2 IMPLANT
SYR 30ML LL (SYRINGE) ×2 IMPLANT
SYR BULB IRRIG 60ML STRL (SYRINGE) ×4 IMPLANT

## 2020-10-11 NOTE — Anesthesia Procedure Notes (Signed)
Anesthesia Regional Block: Adductor canal block   Pre-Anesthetic Checklist: ,, timeout performed, Correct Patient, Correct Site, Correct Laterality, Correct Procedure, Correct Position, site marked, Risks and benefits discussed, at surgeon's request and post-op pain management  Laterality: Right  Prep: chloraprep       Needles:  Injection technique: Single-shot  Needle Type: Stimulator Needle - 80     Needle Length: 10cm  Needle Gauge: 20   Needle insertion depth: 6 cm   Additional Needles:   Procedures:,,,, ultrasound used (permanent image in chart),,,,  Narrative:  Start time: 10/11/2020 8:05 AM End time: 10/11/2020 8:10 AM  Performed by: Personally  Anesthesiologist: Denese Killings, MD  Additional Notes: BP cuff, EKG monitors applied. Sedation begun. After nerve location anesthetic injected incrementally, slowly , and after neg aspirations. Tolerated well. bupivacaine 0.25% with epi and dexamethasone 4 mg, total of 16 ml was injected for saphenous nerve block

## 2020-10-11 NOTE — Brief Op Note (Signed)
10/11/2020  10:30 AM  PATIENT:  [name] Vasconcelos  74 y.o. female  PRE-OPERATIVE DIAGNOSIS:  Right trimalleolar ankle fracture  POST-OPERATIVE DIAGNOSIS:  Right trimalleolar ankle fracture  PROCEDURE:  Procedure(s): OPEN REDUCTION INTERNAL FIXATION (ORIF) ANKLE FRACTURE (Right)  SURGEON:  Surgeon(s) and Role:    [name], MD - Primary  PHYSICIAN ASSISTANT:   ASSISTANTS: none   ANESTHESIA:   regional and general  EBL:  100 mL   BLOOD ADMINISTERED:none  DRAINS: none   LOCAL MEDICATIONS USED:  MARCAINE     SPECIMEN:  No Specimen  DISPOSITION OF SPECIMEN:  N/A  COUNTS:  YES  TOURNIQUET:   Total Tourniquet Time Documented: Thigh (Right) - 93 minutes Total: Thigh (Right) - 93 minutes   DICTATION: .Note written in EPIC  PLAN OF CARE: Discharge to home after PACU  PATIENT DISPOSITION:  PACU - hemodynamically stable.   Delay start of Pharmacological VTE agent (>24hrs) due to surgical blood loss or risk of bleeding: yes

## 2020-10-11 NOTE — Anesthesia Preprocedure Evaluation (Addendum)
Anesthesia Evaluation  Patient identified by MRN, date of birth, ID band Patient awake    Reviewed: Allergy & Precautions, NPO status , Patient's Chart, lab work & pertinent test results  History of Anesthesia Complications Negative for: history of anesthetic complications  Airway Mallampati: III  TM Distance: >3 FB Neck ROM: Full    Dental  (+) Dental Advisory Given, Missing   Pulmonary neg pulmonary ROS,    Pulmonary exam normal breath sounds clear to auscultation       Cardiovascular Exercise Tolerance: Poor hypertension, Pt. on medications Normal cardiovascular exam Rhythm:Regular Rate:Normal     Neuro/Psych PSYCHIATRIC DISORDERS Anxiety Depression  Neuromuscular disease (Parkinsonism)    GI/Hepatic negative GI ROS, Neg liver ROS,   Endo/Other  negative endocrine ROS  Renal/GU Renal InsufficiencyRenal disease (AKI)     Musculoskeletal  (+) Arthritis ,   Abdominal   Peds  Hematology negative hematology ROS (+)   Anesthesia Other Findings   Reproductive/Obstetrics negative OB ROS                            Anesthesia Physical Anesthesia Plan  ASA: III  Anesthesia Plan: General   Post-op Pain Management:  Regional for Post-op pain   Induction: Intravenous  PONV Risk Score and Plan: 4 or greater and Ondansetron and Dexamethasone  Airway Management Planned: Oral ETT  Additional Equipment:   Intra-op Plan:   Post-operative Plan: Extubation in OR  Informed Consent: I have reviewed the patients History and Physical, chart, labs and discussed the procedure including the risks, benefits and alternatives for the proposed anesthesia with the patient or authorized representative who has indicated his/her understanding and acceptance.     Dental advisory given  Plan Discussed with: CRNA and Surgeon  Anesthesia Plan Comments:         Anesthesia Quick Evaluation

## 2020-10-11 NOTE — Progress Notes (Signed)
Patient resting quietly with call bell in reach, postoperative room 7, with right leg elevation. Patient waiting for EMS transportation to home. Husband [name] to pick up medications and meet EMS upon arrival to patient home.

## 2020-10-11 NOTE — Anesthesia Postprocedure Evaluation (Signed)
Anesthesia Post Note  Patient: Marisha Renier  Procedure(s) Performed: OPEN REDUCTION INTERNAL FIXATION (ORIF) ANKLE FRACTURE (Right Ankle)  Patient location during evaluation: PACU Anesthesia Type: General Level of consciousness: awake and oriented Pain management: pain level controlled Vital Signs Assessment: post-procedure vital signs reviewed and stable Respiratory status: spontaneous breathing and patient connected to nasal cannula oxygen Cardiovascular status: blood pressure returned to baseline Postop Assessment: no apparent nausea or vomiting Anesthetic complications: no   No complications documented.   Last Vitals:  Vitals:   10/11/20 1045 10/11/20 1100  BP: 94/60   Pulse: 79 78  Resp: 13 14  Temp:    SpO2: 93% 92%    Last Pain:  Vitals:   10/11/20 1025  TempSrc:   PainSc: [place] Adlee Paar

## 2020-10-11 NOTE — Progress Notes (Signed)
Call to Rana Snare at [facility] [phone] regarding Dr. Amedeo Kinsman will be discharging to home under care of husband.

## 2020-10-11 NOTE — Anesthesia Procedure Notes (Signed)
Procedure Name: Intubation Date/Time: 10/11/2020 8:26 AM Performed by: Karna Dupes, CRNA Pre-anesthesia Checklist: Patient identified, Emergency Drugs available, Suction available and Patient being monitored Patient Re-evaluated:Patient Re-evaluated prior to induction Oxygen Delivery Method: Circle system utilized Preoxygenation: Pre-oxygenation with 100% oxygen Induction Type: IV induction Ventilation: Mask ventilation without difficulty Laryngoscope Size: Mac and 3 Grade View: Grade I Tube type: Oral Tube size: 7.0 mm Number of attempts: 1 Airway Equipment and Method: Stylet Placement Confirmation: ETT inserted through vocal cords under direct vision,  positive ETCO2 and breath sounds checked- equal and bilateral Secured at: 20 cm Tube secured with: Tape Dental Injury: Teeth and Oropharynx as per pre-operative assessment

## 2020-10-11 NOTE — Anesthesia Procedure Notes (Signed)
Anesthesia Regional Block: Popliteal block   Pre-Anesthetic Checklist: ,, timeout performed, Correct Patient, Correct Site, Correct Laterality, Correct Procedure, Correct Position, site marked, Risks and benefits discussed, at surgeon's request and post-op pain management  Laterality: Right  Prep: chloraprep       Needles:  Injection technique: Single-shot  Needle Type: Echogenic Stimulator Needle     Needle Length: 10cm  Needle Gauge: 20   Needle insertion depth: 7 cm   Additional Needles:   Procedures:,,,, ultrasound used (permanent image in chart),,,,  Narrative:  Start time: 10/11/2020 7:58 AM End time: 10/11/2020 8:04 AM  Performed by: Personally  Anesthesiologist: Denese Killings, MD  Additional Notes: BP cuff, EKG monitors applied. Sedation begun.  After nerve location anesthetic injected incrementally, slowly , and after neg aspirations. Tolerated well.  Bupivacaine 0.5% 14 ml plus bupivacaine 0.25% with epi 14 ml and dexamethasone 4 mg - total of 29 ml was injected around popliteal nerve.

## 2020-10-11 NOTE — Interval H&P Note (Signed)
History and Physical Interval Note:  10/11/2020 7:19 AM  Tonya Perez  has presented today for surgery, with the diagnosis of Right trimalleolar ankle fracture.  The various methods of treatment have been discussed with the patient and family. After consideration of risks, benefits and other options for treatment, the patient has consented to  Procedure(s) with comments: OPEN REDUCTION INTERNAL FIXATION (ORIF) ANKLE FRACTURE (Right) - pt was discharged to nursing facility, will need RAPID covid test as a surgical intervention.  The patient's history has been reviewed, patient examined, no change in status, stable for surgery.  I have reviewed the patient's chart and labs.  Questions were answered to the patient's satisfaction.    Right ankle fracture, requires operative fixation.  Patient's pain has improved.  Swelling in foot has improved.    [name]

## 2020-10-11 NOTE — Progress Notes (Signed)
Patient Update  Patient presented to hospital this morning for surgery from a SNF.  I discussed her situation with her husband prior to surgery and he is comfortable taking care of her at home.  He has a list to assist with moving in and out of bed.  In addition, he has a wheelchair, bedside commode and can help her get to a bed pan.  I am comfortable with the patient going home today via EMS, to be cared for by her husband.  If necessary, we can help them setup home health therapy and nursing.  I do think she will do better at home as she was not resting well at the SNF.  If they have any issues, they can contact the clinic.  I will see her in 2 weeks for her first postoperative visit.    Tonya Canny A. Amedeo Kinsman, MD [facility] [address] [place],  [place]  [address] Phone: [phone] Fax: [phone]

## 2020-10-11 NOTE — Discharge Instructions (Signed)
Mark A. Amedeo Kinsman, MD [facility] [address] [place],  [place]  [address] Phone: [phone] Fax: [phone]   [place] ? Please keep splint clean dry and intact until followup.  ? You may shower on Post-Op Day #2.  ? You must keep splint dry during this process and may find that a plastic bag taped around the leg or alternatively a towel based bath may be a better option.   ? If you get your splint wet or if it is damaged please contact our clinic.  EXERCISES ? Due to your splint being in place you will not be able to bear weight through your extremity.   ? DO NOT PUT ANY WEIGHT ON YOUR OPERATIVE LEG ? Please use crutches or a walker to avoid weight bearing.   REGIONAL ANESTHESIA (NERVE BLOCKS) . The anesthesia team may have performed a nerve block for you if safe in the setting of your care.  This is a great tool used to minimize pain.  Typically the block may start wearing off overnight but the long acting medicine may last for 3-4 days.  The nerve block wearing off can be a challenging period but please utilize your as needed pain medications to try and manage this period.    POST-OP MEDICATIONS- Multimodal approach to pain control . In general your pain will be controlled with a combination of substances.  Prescriptions unless otherwise discussed are electronically sent to your pharmacy.  This is a carefully made plan we use to minimize narcotic use.     - Meloxicam OR Celebrex - Anti-inflammatory medication taken on a scheduled basis  - Acetaminophen - Non-narcotic pain medicine taken on a scheduled basis   - Oxycodone - This is a strong narcotic, to be used only on an "as needed" basis for pain.  -  Aspirin 67m - This medicine is used to minimize the risk of blood clots after surgery.             -          Zofran - take as needed for nausea   FOLLOW-UP ? If you develop a Fever (>101.5),  Redness or Drainage from the surgical incision site, please call our office to arrange for an evaluation. ? Please call the office to schedule a follow-up appointment for your incision check if you do not already have one, 10-14 days post-operatively.  IF YOU HAVE ANY QUESTIONS, PLEASE FEEL FREE TO CALL OUR OFFICE.  HELPFUL INFORMATION  ? If you had a block, it will wear off between 8-24 hrs postop typically.  This is period when your pain may go from nearly zero to the pain you would have had postop without the block.  This is an abrupt transition but nothing dangerous is happening.  You may take an extra dose of narcotic when this happens.  ? You should wean off your narcotic medicines as soon as you are able.  Most patients will be off or using minimal narcotics before their first postop appointment.   ? Elevating your leg will help with swelling and pain control.  You are encouraged to elevate your leg as much as possible in the first couple of weeks following surgery.  Imagine a drop of water on your toe, and your goal is to get that water back to your heart.  ? We suggest you use the pain medication the first night prior to  going to bed, in order to ease any pain when the anesthesia wears off. You should avoid taking pain medications on an empty stomach as it will make you nauseous.  ? Do not drink alcoholic beverages or take illicit drugs when taking pain medications.  ? In most states it is against the law to drive while you are in a splint or sling.  And certainly against the law to drive while taking narcotics.  ? You may return to work/school in the next couple of days when you feel up to it.   ? Pain medication may make you constipated.  Below are a few solutions to try in this order: - Decrease the amount of pain medication if you aren't having pain. - Drink lots of decaffeinated fluids. - Drink prune juice and/or each dried prunes  o If the first 3 don't work start with  additional solutions - Take Colace - an over-the-counter stool softener - Take Senokot - an over-the-counter laxative - Take Miralax - a stronger over-the-counter laxative       Regional Anesthesia  Regional anesthesia is a method used to temporarily block feeling in one area of the body. You may have regional anesthesia before a medical procedure or surgery. A health care provider who specializes in giving anesthesia (anesthesiologist) injects a type of medicine near a nerve or a group of nerves. This medicine makes that area of the body numb. Regional anesthesia allows you to be awake during the procedure or surgery but keeps you from feeling pain in the affected area. There are three types of regional anesthesia:  Spinal anesthesia. This is a one-time injection of medicine into the fluid that surrounds your spinal cord. This numbs the area below and slightly above the injection site.  Epidural anesthesia. This is another medicine that may be placed into your back, but just outside of the protective tissue that covers your spinal cord. Instead of a one-time injection, the medicine is often given gradually over time through a small tube (catheter)that remains in your back for as long as pain control is needed.  Peripheral nerve block. This is an injection that is given in an area of the body other than the spine to block all feeling below the injection site. Peripheral nerve blocks may be given as a single injection before your procedure or may be given through a catheter for as long as you need pain control. Regional anesthesia can be used alone or in combination with other types of anesthesia. Compared to using medicine that makes you fall asleep (general anesthetic), regional anesthesia has many benefits, such as:  Improved pain control after your surgery.  Less nausea, vomiting, or drowsiness after surgery.  A faster recovery. Tell a health care provider about:  Any allergies you  have.  All medicines you are taking, including vitamins, herbs, eye drops, creams, and over-the-counter medicines.  Any use of drugs, alcohol, or tobacco.  Any problems you or family members have had with anesthetic medicines.  Any blood disorders you have.  Any surgeries you have had.  Any medical conditions you have or have had, especially heart failure, chronic obstructive pulmonary disease (COPD), or sleep apnea.  Whether you are pregnant or may be pregnant. What are the risks? Generally, this is a safe procedure. However, problems may occur, including:  Pain.  Nausea.  Vomiting.  Itching.  Low blood pressure.  Headache.  Nerve damage.  Infection.  Bleeding around the injection site.  Trouble urinating.  Allergic  reactions to medicine. What happens before the procedure? Staying hydrated Follow instructions from your health care provider about hydration, which may include:  Up to 2 hours before the procedure - you may continue to drink clear liquids, such as water, clear fruit juice, black coffee, and plain tea. Eating and drinking restrictions Follow instructions from your health care provider about eating and drinking, which may include:  8 hours before the procedure - stop eating heavy meals or foods, such as meat, fried foods, or fatty foods.  6 hours before the procedure - stop eating light meals or foods, such as toast or cereal.  6 hours before the procedure - stop drinking milk or drinks that contain milk.  2 hours before the procedure - stop drinking clear liquids. Medicines Ask your health care provider about:  Changing or stopping your regular medicines. This is especially important if you are taking diabetes medicines or blood thinners.  Taking medicines such as aspirin and ibuprofen. These medicines can thin your blood. Do not take these medicines unless your health care provider tells you to take them.  Taking over-the-counter medicines,  vitamins, herbs, and supplements. General instructions  Plan to have someone take you home from the hospital or clinic.  If you will be going home right after the procedure, plan to have someone with you for 24 hours.  You may need to have blood or imaging tests.  Ask your health care provider what steps will be taken to help prevent infection. These may include washing skin with a germ-killing soap.  If you use a sleep apnea device, ask your health care provider whether you should bring it with you on the day of your surgery. What happens during the procedure?  Depending on the medical procedure you are having done, an IV may be inserted into one of your veins.  The anesthesiologist will do a physical exam to find the best location to give the regional anesthesia. To locate the nerve, he or she may also use: ? A device that activates the nerve and causes your muscles to twitch (nerve stimulator). ? An imaging tool that uses sound waves to create images of the area (ultrasound).  You may be given a medicine to help you relax (sedative).  A medicine called a local anesthetic may be injected to numb the area where the regional anesthetic will be injected.  You will get regional anesthesia by injection or through a catheter.  The anesthesiologist will check to make sure the medicine is working before the rest of your medical procedure begins.  Depending on the type of regional anesthesia you received, you may have a small bandage (dressing) placed over the injection site. The procedure may vary among health care providers and hospitals. What can I expect after the procedure? After your procedure, it is common to have:  Sleepiness.  Nausea.  Itching.  Numbness.  Shivering or feeling cold. Your blood pressure, heart rate, breathing rate, and blood oxygen level will be monitored until you leave the hospital or clinic. Follow these instructions at home:  Do not drive for 24  hours if you were given a sedative during your procedure.  Take over-the-counter and prescription medicines only as told by your health care provider.  Do not drive, exercise, or do any other activities that require coordination for 24 hours or as told by your health care provider. Ask your health care provider when you can return to your usual activities.  Drink enough fluid to keep  your urine pale yellow.  If you had a dressing placed over the injection site, only remove it when told to do so by your health care provider.  Keep all follow-up visits as told by your health care provider. This is important. Contact a health care provider if you:  Continue to have nausea and vomiting for more than 1 day.  Develop a rash.  Have trouble urinating. Get help right away if you:  Have bleeding from the injection site or bleeding under the skin at the injection site.  Have redness, swelling, or pain around your injection site.  Have a fever.  Develop a headache.  Develop new numbness or weakness. Summary  Regional anesthesia is a method used to temporarily block feeling in one area of the body. It may be done to block pain during a medical procedure or surgery.  Follow instructions from your health care provider about taking medicines and about eating and drinking before the procedure.  Ask your health care provider when you can return to your usual activities after the procedure. This information is not intended to replace advice given to you by your health care provider. Make sure you discuss any questions you have with your health care provider. Document Revised: 11/24/2018 Document Reviewed: 11/24/2018 Elsevier Patient Education  [facility] Anesthesia, Adult, Care After This sheet gives you information about how to care for yourself after your procedure. Your health care provider may also give you more specific instructions. If you have problems or questions,  contact your health care provider. What can I expect after the procedure? After the procedure, the following side effects are common:  Pain or discomfort at the IV site.  Nausea.  Vomiting.  Sore throat.  Trouble concentrating.  Feeling cold or chills.  Weak or tired.  Sleepiness and fatigue.  Soreness and body aches. These side effects can affect parts of the body that were not involved in surgery. Follow these instructions at home:  For at least 24 hours after the procedure:  Have a responsible adult stay with you. It is important to have someone help care for you until you are awake and alert.  Rest as needed.  Do not: ? Participate in activities in which you could fall or become injured. ? Drive. ? Use heavy machinery. ? Drink alcohol. ? Take sleeping pills or medicines that cause drowsiness. ? Make important decisions or sign legal documents. ? Take care of children on your own. Eating and drinking  Follow any instructions from your health care provider about eating or drinking restrictions.  When you feel hungry, start by eating small amounts of foods that are soft and easy to digest (bland), such as toast. Gradually return to your regular diet.  Drink enough fluid to keep your urine pale yellow.  If you vomit, rehydrate by drinking water, juice, or clear broth. General instructions  If you have sleep apnea, surgery and certain medicines can increase your risk for breathing problems. Follow instructions from your health care provider about wearing your sleep device: ? Anytime you are sleeping, including during daytime naps. ? While taking prescription pain medicines, sleeping medicines, or medicines that make you drowsy.  Return to your normal activities as told by your health care provider. Ask your health care provider what activities are safe for you.  Take over-the-counter and prescription medicines only as told by your health care provider.  If you  smoke, do not smoke without supervision.  Keep  all follow-up visits as told by your health care provider. This is important. Contact a health care provider if:  You have nausea or vomiting that does not get better with medicine.  You cannot eat or drink without vomiting.  You have pain that does not get better with medicine.  You are unable to pass urine.  You develop a skin rash.  You have a fever.  You have redness around your IV site that gets worse. Get help right away if:  You have difficulty breathing.  You have chest pain.  You have blood in your urine or stool, or you vomit blood. Summary  After the procedure, it is common to have a sore throat or nausea. It is also common to feel tired.  Have a responsible adult stay with you for the first 24 hours after general anesthesia. It is important to have someone help care for you until you are awake and alert.  When you feel hungry, start by eating small amounts of foods that are soft and easy to digest (bland), such as toast. Gradually return to your regular diet.  Drink enough fluid to keep your urine pale yellow.  Return to your normal activities as told by your health care provider. Ask your health care provider what activities are safe for you. This information is not intended to replace advice given to you by your health care provider. Make sure you discuss any questions you have with your health care provider. Document Revised: 10/11/2017 Document Reviewed: 05/24/2017 Elsevier Patient Education  2020 Elsevier Inc.    Gabapentin capsules or tablets What is this medicine? GABAPENTIN (GA ba pen tin) is used to control seizures in certain types of epilepsy. It is also used to treat certain types of nerve pain. This medicine may be used for other purposes; ask your health care provider or pharmacist if you have questions. COMMON BRAND NAME(S): Active-PAC with Gabapentin, Gabarone, Neurontin What should I tell my  health care provider before I take this medicine? They need to know if you have any of these conditions:  history of drug abuse or alcohol abuse problem  kidney disease  lung or breathing disease  suicidal thoughts, plans, or attempt; a previous suicide attempt by you or a family member  an unusual or allergic reaction to gabapentin, other medicines, foods, dyes, or preservatives  pregnant or trying to get pregnant  breast-feeding How should I use this medicine? Take this medicine by mouth with a glass of water. Follow the directions on the prescription label. You can take it with or without food. If it upsets your stomach, take it with food. Take your medicine at regular intervals. Do not take it more often than directed. Do not stop taking except on your doctor's advice. If you are directed to break the 600 or 800 mg tablets in half as part of your dose, the extra half tablet should be used for the next dose. If you have not used the extra half tablet within 28 days, it should be thrown away. A special MedGuide will be given to you by the pharmacist with each prescription and refill. Be sure to read this information carefully each time. Talk to your pediatrician regarding the use of this medicine in children. While this drug may be prescribed for children as young as 3 years for selected conditions, precautions do apply. Overdosage: If you think you have taken too much of this medicine contact a poison control center or emergency room at once.  NOTE: This medicine is only for you. Do not share this medicine with others. What if I miss a dose? If you miss a dose, take it as soon as you can. If it is almost time for your next dose, take only that dose. Do not take double or extra doses. What may interact with this medicine? This medicine may interact with the following medications:  alcohol  antihistamines for allergy, cough, and cold  certain medicines for anxiety or sleep  certain  medicines for depression like amitriptyline, fluoxetine, sertraline  certain medicines for seizures like phenobarbital, primidone  certain medicines for stomach problems  general anesthetics like halothane, isoflurane, methoxyflurane, propofol  local anesthetics like lidocaine, pramoxine, tetracaine  medicines that relax muscles for surgery  narcotic medicines for pain  phenothiazines like chlorpromazine, mesoridazine, prochlorperazine, thioridazine This list may not describe all possible interactions. Give your health care provider a list of all the medicines, herbs, non-prescription drugs, or dietary supplements you use. Also tell them if you smoke, drink alcohol, or use illegal drugs. Some items may interact with your medicine. What should I watch for while using this medicine? Visit your doctor or health care provider for regular checks on your progress. You may want to keep a record at home of how you feel your condition is responding to treatment. You may want to share this information with your doctor or health care provider at each visit. You should contact your doctor or health care provider if your seizures get worse or if you have any new types of seizures. Do not stop taking this medicine or any of your seizure medicines unless instructed by your doctor or health care provider. Stopping your medicine suddenly can increase your seizures or their severity. This medicine may cause serious skin reactions. They can happen weeks to months after starting the medicine. Contact your health care provider right away if you notice fevers or flu-like symptoms with a rash. The rash may be red or purple and then turn into blisters or peeling of the skin. Or, you might notice a red rash with swelling of the face, lips or lymph nodes in your neck or under your arms. Wear a medical identification bracelet or chain if you are taking this medicine for seizures, and carry a card that lists all your  medications. You may get drowsy, dizzy, or have blurred vision. Do not drive, use machinery, or do anything that needs mental alertness until you know how this medicine affects you. To reduce dizzy or fainting spells, do not sit or stand up quickly, especially if you are an older patient. Alcohol can increase drowsiness and dizziness. Avoid alcoholic drinks. Your mouth may get dry. Chewing sugarless gum or sucking hard candy, and drinking plenty of water will help. The use of this medicine may increase the chance of suicidal thoughts or actions. Pay special attention to how you are responding while on this medicine. Any worsening of mood, or thoughts of suicide or dying should be reported to your health care provider right away. Women who become pregnant while using this medicine may enroll in the [place] Pregnancy Registry by calling [phone]. This registry collects information about the safety of antiepileptic drug use during pregnancy. What side effects may I notice from receiving this medicine? Side effects that you should report to your doctor or health care professional as soon as possible:  allergic reactions like skin rash, itching or hives, swelling of the face, lips, or tongue  breathing  problems  rash, fever, and swollen lymph nodes  redness, blistering, peeling or loosening of the skin, including inside the mouth  suicidal thoughts, mood changes Side effects that usually do not require medical attention (report to your doctor or health care professional if they continue or are bothersome):  dizziness  drowsiness  headache  nausea, vomiting  swelling of ankles, feet, hands  tiredness This list may not describe all possible side effects. Call your doctor for medical advice about side effects. You may report side effects to FDA at 1-800-FDA-1088. Where should I keep my medicine? Keep out of reach of children. This medicine may cause accidental  overdose and death if it taken by other adults, children, or pets. Mix any unused medicine with a substance like cat litter or coffee grounds. Then throw the medicine away in a sealed container like a sealed bag or a coffee can with a lid. Do not use the medicine after the expiration date. Store at room temperature between 15 and 30 degrees C (59 and 86 degrees F). NOTE: This sheet is a summary. It may not cover all possible information. If you have questions about this medicine, talk to your doctor, pharmacist, or health care provider.  2020 Elsevier/Gold Standard (2019-01-09 14:16:43)   Meloxicam tablets What is this medicine? MELOXICAM (mel OX i cam) is a non-steroidal anti-inflammatory drug (NSAID). It is used to reduce swelling and to treat pain. It may be used for osteoarthritis, rheumatoid arthritis, or juvenile rheumatoid arthritis. This medicine may be used for other purposes; ask your health care provider or pharmacist if you have questions. COMMON BRAND NAME(S): Mobic What should I tell my health care provider before I take this medicine? They need to know if you have any of these conditions:  bleeding disorders  cigarette smoker  coronary artery bypass graft (CABG) surgery within the past 2 weeks  drink more than 3 alcohol-containing drinks per day  heart disease  high blood pressure  history of stomach bleeding  kidney disease  liver disease  lung or breathing disease, like asthma  stomach or intestine problems  an unusual or allergic reaction to meloxicam, aspirin, other NSAIDs, other medicines, foods, dyes, or preservatives  pregnant or trying to get pregnant  breast-feeding How should I use this medicine? Take this medicine by mouth with a full glass of water. Follow the directions on the prescription label. You can take it with or without food. If it upsets your stomach, take it with food. Take your medicine at regular intervals. Do not take it more often  than directed. Do not stop taking except on your doctor's advice. A special MedGuide will be given to you by the pharmacist with each prescription and refill. Be sure to read this information carefully each time. Talk to your pediatrician regarding the use of this medicine in children. While this drug may be prescribed for selected conditions, precautions do apply. Patients over 25 years old may have a stronger reaction and need a smaller dose. Overdosage: If you think you have taken too much of this medicine contact a poison control center or emergency room at once. NOTE: This medicine is only for you. Do not share this medicine with others. What if I miss a dose? If you miss a dose, take it as soon as you can. If it is almost time for your next dose, take only that dose. Do not take double or extra doses. What may interact with this medicine? Do not take this medicine  with any of the following medications:  cidofovir  ketorolac This medicine may also interact with the following medications:  aspirin and aspirin-like medicines  certain medicines for blood pressure, heart disease, irregular heart beat  certain medicines for depression, anxiety, or psychotic disturbances  certain medicines that treat or prevent blood clots like warfarin, enoxaparin, dalteparin, apixaban, dabigatran, rivaroxaban  cyclosporine  diuretics  fluconazole  lithium  methotrexate  other NSAIDs, medicines for pain and inflammation, like ibuprofen and naproxen  pemetrexed This list may not describe all possible interactions. Give your health care provider a list of all the medicines, herbs, non-prescription drugs, or dietary supplements you use. Also tell them if you smoke, drink alcohol, or use illegal drugs. Some items may interact with your medicine. What should I watch for while using this medicine? Tell your doctor or healthcare provider if your symptoms do not start to get better or if they get  worse. This medicine may cause serious skin reactions. They can happen weeks to months after starting the medicine. Contact your healthcare provider right away if you notice fevers or flu-like symptoms with a rash. The rash may be red or purple and then turn into blisters or peeling of the skin. Or, you might notice a red rash with swelling of the face, lips or lymph nodes in your neck or under your arms. Do not take other medicines that contain aspirin, ibuprofen, or naproxen with this medicine. Side effects such as stomach upset, nausea, or ulcers may be more likely to occur. Many medicines available without a prescription should not be taken with this medicine. This medicine can cause ulcers and bleeding in the stomach and intestines at any time during treatment. This can happen with no warning and may cause death. There is increased risk with taking this medicine for a long time. Smoking, drinking alcohol, older age, and poor health can also increase risks. Call your doctor right away if you have stomach pain or blood in your vomit or stool. This medicine does not prevent heart attack or stroke. In fact, this medicine may increase the chance of a heart attack or stroke. The chance may increase with longer use of this medicine and in people who have heart disease. If you take aspirin to prevent heart attack or stroke, talk with your doctor or healthcare provider. What side effects may I notice from receiving this medicine? Side effects that you should report to your doctor or health care professional as soon as possible:  allergic reactions like skin rash, itching or hives, swelling of the face, lips, or tongue  nausea, vomiting  redness, blistering, peeling, or loosening of the skin, including inside the mouth  signs and symptoms of a blood clot such as breathing problems; changes in vision; chest pain; severe, sudden headache; pain, swelling, warmth in the leg; trouble speaking; sudden numbness or  weakness of the face, arm, or leg  signs and symptoms of bleeding such as bloody or black, tarry stools; red or dark-brown urine; spitting up blood or brown material that looks like coffee grounds; red spots on the skin; unusual bruising or bleeding from the eye, gums, or nose  signs and symptoms of liver injury like dark yellow or brown urine; general ill feeling or flu-like symptoms; light-colored stools; loss of appetite; nausea; right upper belly pain; unusually weak or tired; yellowing of the eyes or skin  signs and symptoms of stroke like changes in vision; confusion; trouble speaking or understanding; severe headaches; sudden numbness or  weakness of the face, arm, or leg; trouble walking; dizziness; loss of balance or coordination Side effects that usually do not require medical attention (report to your doctor or health care professional if they continue or are bothersome):  constipation  diarrhea  gas This list may not describe all possible side effects. Call your doctor for medical advice about side effects. You may report side effects to FDA at 1-800-FDA-1088. Where should I keep my medicine? Keep out of the reach of children. Store at room temperature between 15 and 30 degrees C (59 and 86 degrees F). Throw away any unused medicine after the expiration date. NOTE: This sheet is a summary. It may not cover all possible information. If you have questions about this medicine, talk to your doctor, pharmacist, or health care provider.  2020 [employer]/Gold Standard (2019-01-07 11:21:28)    Oxycodone tablets or capsules What is this medicine? OXYCODONE (ox i KOE done) is a pain reliever. It is used to treat moderate to severe pain. This medicine may be used for other purposes; ask your health care provider or pharmacist if you have questions. COMMON BRAND NAME(S): Dazidox, Endocodone, Oxaydo, OXECTA, OxyIR, Percolone, Roxicodone, Roxybond What should I tell my health care provider  before I take this medicine? They need to know if you have any of these conditions:  Addison's disease  brain tumor  head injury  heart disease  history of drug or alcohol abuse problem  if you often drink alcohol  kidney disease  liver disease  lung or breathing disease, like asthma  mental illness  pancreatic disease  seizures  thyroid disease  an unusual or allergic reaction to oxycodone, codeine, hydrocodone, morphine, other medicines, foods, dyes, or preservatives  pregnant or trying to get pregnant  breast-feeding How should I use this medicine? Take this medicine by mouth with a glass of water. Follow the directions on the prescription label. You can take it with or without food. If it upsets your stomach, take it with food. Take your medicine at regular intervals. Do not take it more often than directed. Do not stop taking except on your doctor's advice. Some brands of this medicine, like Oxecta, have special instructions. Ask your doctor or pharmacist if these directions are for you: Do not cut, crush or chew this medicine. Swallow only one tablet at a time. Do not wet, soak, or lick the tablet before you take it. A special MedGuide will be given to you by the pharmacist with each prescription and refill. Be sure to read this information carefully each time. Talk to your pediatrician regarding the use of this medicine in children. Special care may be needed. Overdosage: If you think you have taken too much of this medicine contact a poison control center or emergency room at once. NOTE: This medicine is only for you. Do not share this medicine with others. What if I miss a dose? If you miss a dose, take it as soon as you can. If it is almost time for your next dose, take only that dose. Do not take double or extra doses. What may interact with this medicine? This medicine may interact with the following medications:  alcohol  antihistamines for allergy, cough  and cold  antiviral medicines for HIV or AIDS  atropine  certain antibiotics like clarithromycin, erythromycin, linezolid, rifampin  certain medicines for anxiety or sleep  certain medicines for bladder problems like oxybutynin, tolterodine  certain medicines for depression like amitriptyline, fluoxetine, sertraline  certain medicines for  fungal infections like ketoconazole, itraconazole, voriconazole  certain medicines for migraine headache like almotriptan, eletriptan, frovatriptan, naratriptan, rizatriptan, sumatriptan, zolmitriptan  certain medicines for nausea or vomiting like dolasetron, ondansetron, palonosetron  certain medicines for Parkinson's disease like benztropine, trihexyphenidyl  certain medicines for seizures like phenobarbital, phenytoin, primidone  certain medicines for stomach problems like dicyclomine, hyoscyamine  certain medicines for travel sickness like scopolamine  diuretics  general anesthetics like halothane, isoflurane, methoxyflurane, propofol  ipratropium  local anesthetics like lidocaine, pramoxine, tetracaine  MAOIs like Carbex, Eldepryl, Marplan, Nardil, and Parnate  medicines that relax muscles for surgery  methylene blue  nilotinib  other narcotic medicines for pain or cough  phenothiazines like chlorpromazine, mesoridazine, prochlorperazine, thioridazine This list may not describe all possible interactions. Give your health care provider a list of all the medicines, herbs, non-prescription drugs, or dietary supplements you use. Also tell them if you smoke, drink alcohol, or use illegal drugs. Some items may interact with your medicine. What should I watch for while using this medicine? Tell your health care provider if your pain does not go away, if it gets worse, or if you have new or a different type of pain. You may develop tolerance to this drug. Tolerance means that you will need a higher dose of the drug for pain relief.  Tolerance is normal and is expected if you take this drug for a long time. There are different types of narcotic drugs (opioids) for pain. If you take more than one type at the same time, you may have more side effects. Give your health care provider a list of all drugs you use. He or she will tell you how much drug to take. Do not take more drug than directed. Get emergency help right away if you have problems breathing. Do not suddenly stop taking your drug because you may develop a severe reaction. Your body becomes used to the drug. This does NOT mean you are addicted. Addiction is a behavior related to getting and using a drug for a nonmedical reason. If you have pain, you have a medical reason to take pain drug. Your health care provider will tell you how much drug to take. If your health care provider wants you to stop the drug, the dose will be slowly lowered over time to avoid any side effects. Talk to your health care provider about naloxone and how to get it. Naloxone is an emergency drug used for an opioid overdose. An overdose can happen if you take too much opioid. It can also happen if an opioid is taken with some other drugs or substances, like alcohol. Know the symptoms of an overdose, like trouble breathing, unusually tired or sleepy, or not being able to respond or wake up. Make sure to tell caregivers and close contacts where it is stored. Make sure they know how to use it. After naloxone is given, you must get emergency help right away. Naloxone is a temporary treatment. Repeat doses may be needed. You may get drowsy or dizzy. Do not drive, use machinery, or do anything that needs mental alertness until you know how this drug affects you. Do not stand up or sit up quickly, especially if you are an older patient. This reduces the risk of dizzy or fainting spells. Alcohol may interfere with the effect of this drug. Avoid alcoholic drinks. This drug will cause constipation. If you do not have  a bowel movement for 3 days, call your health care provider. Your mouth may  get dry. Chewing sugarless gum or sucking hard candy and drinking plenty of water may help. Contact your health care provider if the problem does not go away or is severe. The tablet shell for some brands of this drug does not dissolve. This is normal. The tablet shell may appear whole in the stool. This is not a cause for concern. What side effects may I notice from receiving this medicine? Side effects that you should report to your doctor or health care professional as soon as possible:  allergic reactions like skin rash, itching or hives, swelling of the face, lips, or tongue  breathing problems  confusion  signs and symptoms of low blood pressure like dizziness; feeling faint or lightheaded, falls; unusually weak or tired  trouble passing urine or change in the amount of urine  trouble swallowing Side effects that usually do not require medical attention (report to your doctor or health care professional if they continue or are bothersome):  constipation  dry mouth  nausea, vomiting  tiredness This list may not describe all possible side effects. Call your doctor for medical advice about side effects. You may report side effects to FDA at 1-800-FDA-1088. Where should I keep my medicine? Keep out of the reach of children. This medicine can be abused. Keep your medicine in a safe place to protect it from theft. Do not share this medicine with anyone. Selling or giving away this medicine is dangerous and against the law. Store at room temperature between 15 and 30 degrees C (59 and 86 degrees F). Protect from light. Keep container tightly closed. This medicine may cause harm and death if it is taken by other adults, children, or pets. Return medicine that has not been used to an official disposal site. Contact the DEA at (802) 035-4389 or your city/county government to find a site. If you cannot return the  medicine, flush it down the toilet. Do not use the medicine after the expiration date. NOTE: This sheet is a summary. It may not cover all possible information. If you have questions about this medicine, talk to your doctor, pharmacist, or health care provider.  2020 Elsevier/Gold Standard (2019-05-19 12:47:59)    Ondansetron oral dissolving tablet What is this medicine? ONDANSETRON (on DAN se tron) is used to treat nausea and vomiting caused by chemotherapy. It is also used to prevent or treat nausea and vomiting after surgery. This medicine may be used for other purposes; ask your health care provider or pharmacist if you have questions. COMMON BRAND NAME(S): Zofran ODT What should I tell my health care provider before I take this medicine? They need to know if you have any of these conditions:  heart disease  history of irregular heartbeat  liver disease  low levels of magnesium or potassium in the blood  an unusual or allergic reaction to ondansetron, granisetron, other medicines, foods, dyes, or preservatives  pregnant or trying to get pregnant  breast-feeding How should I use this medicine? These tablets are made to dissolve in the mouth. Do not try to push the tablet through the foil backing. With dry hands, peel away the foil backing and gently remove the tablet. Place the tablet in the mouth and allow it to dissolve, then swallow. While you may take these tablets with water, it is not necessary to do so. Talk to your pediatrician regarding the use of this medicine in children. Special care may be needed. Overdosage: If you think you have taken too much of this medicine  contact a poison control center or emergency room at once. NOTE: This medicine is only for you. Do not share this medicine with others. What if I miss a dose? If you miss a dose, take it as soon as you can. If it is almost time for your next dose, take only that dose. Do not take double or extra doses. What  may interact with this medicine? Do not take this medicine with any of the following medications:  apomorphine  certain medicines for fungal infections like fluconazole, itraconazole, ketoconazole, posaconazole, voriconazole  cisapride  dronedarone  pimozide  thioridazine This medicine may also interact with the following medications:  carbamazepine  certain medicines for depression, anxiety, or psychotic disturbances  fentanyl  linezolid  MAOIs like Carbex, Eldepryl, Marplan, Nardil, and Parnate  methylene blue (injected into a vein)  other medicines that prolong the QT interval (cause an abnormal heart rhythm) like dofetilide, ziprasidone  phenytoin  rifampicin  tramadol This list may not describe all possible interactions. Give your health care provider a list of all the medicines, herbs, non-prescription drugs, or dietary supplements you use. Also tell them if you smoke, drink alcohol, or use illegal drugs. Some items may interact with your medicine. What should I watch for while using this medicine? Check with your doctor or health care professional as soon as you can if you have any sign of an allergic reaction. What side effects may I notice from receiving this medicine? Side effects that you should report to your doctor or health care professional as soon as possible:  allergic reactions like skin rash, itching or hives, swelling of the face, lips, or tongue  breathing problems  confusion  dizziness  fast or irregular heartbeat  feeling faint or lightheaded, falls  fever and chills  loss of balance or coordination  seizures  sweating  swelling of the hands and feet  tightness in the chest  tremors  unusually weak or tired Side effects that usually do not require medical attention (report to your doctor or health care professional if they continue or are bothersome):  constipation or diarrhea  headache This list may not describe all  possible side effects. Call your doctor for medical advice about side effects. You may report side effects to FDA at 1-800-FDA-1088. Where should I keep my medicine? Keep out of the reach of children. Store between 2 and 30 degrees C (36 and 86 degrees F). Throw away any unused medicine after the expiration date. NOTE: This sheet is a summary. It may not cover all possible information. If you have questions about this medicine, talk to your doctor, pharmacist, or health care provider.  2020 Elsevier/Gold Standard (2018-09-30 07:14:10)

## 2020-10-11 NOTE — Transfer of Care (Signed)
Immediate Anesthesia Transfer of Care Note  Patient: Tonya Perez  Procedure(s) Performed: OPEN REDUCTION INTERNAL FIXATION (ORIF) ANKLE FRACTURE (Right Ankle)  Patient Location: PACU  Anesthesia Type:General  Level of Consciousness: drowsy  Airway & Oxygen Therapy: Patient Spontanous Breathing and Patient connected to nasal cannula oxygen  Post-op Assessment: Report given to RN and Post -op Vital signs reviewed and stable  Post vital signs: Reviewed and stable  Last Vitals:  Vitals Value Taken Time  BP 95/47    Temp 97.5   Pulse 82 10/11/20 1026  Resp 16 10/11/20 1026  SpO2 94 % 10/11/20 1026  Vitals shown include unvalidated device data.  Last Pain:  Vitals:   10/11/20 0650  TempSrc: Oral  PainSc: 0-No pain      Patients Stated Pain Goal: 7 ([id number])  Complications: No complications documented.

## 2020-10-11 NOTE — Progress Notes (Signed)
Call to [facility] transportation scheduling, Reymundo Poll.  Transportation will be arranged to pick patient up to transport to home. Husband will follow EMS to receive patient at home.

## 2020-10-11 NOTE — Op Note (Signed)
Orthopaedic Surgery Operative Note (CSN: [id number])  Tonya Perez  23-Jan-1946 Date of Surgery: 10/11/2020   Diagnoses:  Right trimalleolar ankle fracture  Procedure: Operative fixation of right trimalleolar ankle fracture   Operative Finding Successful completion of the planned procedure.  Distal fibula fixation with locking plate and screws; 2 partially threaded cannulated screws medially and a single tricortical screw to maintain the syndesmosis.     Post-Op Diagnosis: Same Surgeons:Primary: [name], MD Location: [facility] OR ROOM 4 Anesthesia: General with regional anesthesia Antibiotics: Ancef 2 g Tourniquet time:  Total Tourniquet Time Documented: Thigh (Right) - 93 minutes Total: Thigh (Right) - 93 minutes  Estimated Blood Loss: 130 cc Complications: None Specimens: None Implants: Implant Name Type Inv. Item Serial No. Manufacturer Lot No. LRB No. Used Action  SCREW NON-LOCKING 3.5X12MM - SSTERILE ON SET Screw SCREW NON-LOCKING 3.5X12MM STERILE ON SET [employer]  Right 4 Implanted  SCREW LOCKING 2.7X10 ANKLE - SSTERILE ON SET Screw SCREW LOCKING 2.7X10 ANKLE [employer]ER[employer] ON SET [employer]  Right 1 Implanted  SCREW LOW PROFILE 3.5X14 - SSTERILE ON SET Screw SCREW LOW PROFILE 3.5X14 [employer] ON SET [employer]  Right 2 Implanted  PLATE FIBULA 6 HOLE RT LOCK - SSTERILE ON SET Plate PLATE FIBULA 6 HOLE RT LOCK [employer] ON [employer]  Right 1 Implanted  SCREW LOCKING 2.7X12 ANKLE - SSTERILE ON SET Screw SCREW LOCKING 2.7X12 ANKLE [employer]ER[employer] ON SET [employer]  Right 2 Implanted  SCREW CANCELLOUS 4.0X16MM - SSTERILE ON SET Screw SCREW CANCELLOUS 4.0X16MM [employer]ER[employer] ON [employer]  Right 1 Implanted  SCREW CANCELLOUS 3X16MM - SSTERILE ON SET Screw SCREW CANCELLOUS 3X16MM STERILE ON SET [employer]  Right 1 Implanted  SCREW CANN 4.0X50 - SSTERILE ON SET Screw SCREW CANN 4.0X50 [employer]ER[employer]  Right 1 Implanted  SCREW CANN 4.0X60 - SSTERILE ON SET Screw SCREW CANN 4.0X60  STERILE ON SET [employer]  Right 1 Implanted  SCREW LOW PROFILE 4.0X40 - SSTERILE ON SET Screw SCREW LOW PROFILE 4.0X40 [employer]  Right 1 Implanted    Indications for Surgery:   Tonya Perez is a 74 y.o. female who sustained a right trimalleolar ankle fracture.  Her ankle was reduced and splinted and she was discharged home with plan to present to clinic for further evaluation. She returned to the emergency department a few days later due to confusion, and was noted to have an AKI. During this admission, was consulted to evaluate her ankle. She had significant swelling at the time, and she was discharged to a skilled nursing facility.  Benefits and risks of operative and nonoperative management were discussed prior to surgery with patient/guardian(s) and informed consent form was completed.  Specific risks including infection, need for additional surgery, bleeding, nonunion, malunion, persistent pain, damage to surrounding structures and more severe complications associated with anesthesia.   Procedure:   The patient was identified properly. Informed consent was obtained and the surgical site was marked. The patient was taken up to suite where general anesthesia was induced.  The patient was positioned supine on a regular OR table.  The right ankle was prepped and draped in the usual sterile fashion.  Timeout was performed before the beginning of the case.  Tourniquet was used for the above duration. She received 2 g of Ancef prior to incision. She also received a gram of TXA during the beginning stages of surgery.  We started by making a longitudinal incision directly  in line with the lateral fibula. This was confirmed under fluoroscopy. We dissected down to the level of the fibula with a combination of electrocautery and 10 blade. The crossing peroneal nerve was not visualized at this level. We then identified the distal fibula fracture, and proceeded to remove callus. Using fracture  reduction clamps, we achieved an acceptable reduction. We selected the above-stated plate at the back table, and placed this on the lateral fibula. This was secured in place with a K wire. Fluoroscopy confirmed our overall positioning. We then proceeded to place a single nonlocking screw distal to the fracture site. This secured the plate to the lateral fibula. Next, we inserted a multiple locking screws to provide stability in the small distal fragment. The most distal screw did not achieve adequate purchase despite several attempts. This hole was subsequently left open. Once we are satisfied with the security of the distal cluster, we turned our attention to the proximal fibular shaft. We then proceeded to place multiple bicortical screws in the shaft of the fibula. These achieved excellent purchase.  We then turned our attention to the medial malleolus. We made a curvilinear incision directly overlying the fracture site. We dissected sharply down to the periosteum, and identified the fracture. We proceeded to clean the fracture with a elevator, rondure and irrigation. We then placed a fracture reduction clamp to secure the medial malleolus in good position. Fluoroscopy confirmed our overall reduction. We then proceeded to place 2 parallel K wires across the fracture. Fluoroscopy confirmed that these K wires were in bone, not crossing into the joint. We then placed 2 partially-threaded screws across the medial malleolus fracture, and achieved excellent purchase. Positioning was confirmed under fluoroscopy. At this point, we stressed the ankle and I felt that there was some laxity at the syndesmosis. Therefore, I made the decision to place a single syndesmotic screw. This that she tricortical purchase, and was in line with the tibiotalar joint. The syndesmosis was held secure with a large fracture reduction clamp. The fracture reduction clamp was subsequently removed once the screw was placed, and the ankle  stressed once again. Final fluoroscopic imaging demonstrated an excellent reduction, without increased widening of the syndesmosis or medial clear space. We also evaluated the joint critically, and noted a small posterior malleolus fragment. This was minimally displaced. Due to the size of the fragment, there is no need to proceed with surgical intervention.  We irrigated the wound copiously before closing the incision with running nylon suture.  Sterile dressing was placed followed by a well padded splint.  Patient was awoken taken to PACU in stable condition.   Post-operative plan:  The patient will be discharged to home from the PACU.   DVT prophylaxis Aspirin 81 mg twice daily for 6 weeks.    Pain control with PRN pain medication preferring oral medicines.   Follow up plan will be scheduled in approximately 10-14 days for incision check and XR.

## 2020-10-12 ENCOUNTER — Encounter (HOSPITAL_COMMUNITY): Payer: Self-pay | Admitting: Orthopedic Surgery

## 2020-10-20 ENCOUNTER — Other Ambulatory Visit: Payer: Self-pay | Admitting: Orthopedic Surgery

## 2020-10-20 ENCOUNTER — Telehealth: Payer: Self-pay | Admitting: Orthopedic Surgery

## 2020-10-20 DIAGNOSIS — S82401S Unspecified fracture of shaft of right fibula, sequela: Secondary | ICD-10-CM

## 2020-10-20 DIAGNOSIS — S82201S Unspecified fracture of shaft of right tibia, sequela: Secondary | ICD-10-CM

## 2020-10-20 NOTE — Progress Notes (Signed)
Orders placed for home health.

## 2020-10-20 NOTE — Telephone Encounter (Signed)
I called the patient's son and let him know that I would place the order for physical therapy and he voices understanding. No other concerns.

## 2020-10-20 NOTE — Telephone Encounter (Signed)
Patient's son Sharia Reeve called.  He said they were under the impression that after surgery, his mom was to have home health therapy.  He said no one has come or called regarding this.  I told him that I would have the clinical staff check on this for them but I asked that they also look through his mom's discharge papers from the hospital to see if the facility name and number was listed.  He said he would do this.  Do you know about who would be going out to see this patient?  Please advise the patient and her family  Thanks

## 2020-10-23 DIAGNOSIS — H3530 Unspecified macular degeneration: Secondary | ICD-10-CM | POA: Diagnosis not present

## 2020-10-23 DIAGNOSIS — E538 Deficiency of other specified B group vitamins: Secondary | ICD-10-CM | POA: Diagnosis not present

## 2020-10-23 DIAGNOSIS — S82851D Displaced trimalleolar fracture of right lower leg, subsequent encounter for closed fracture with routine healing: Secondary | ICD-10-CM | POA: Diagnosis not present

## 2020-10-23 DIAGNOSIS — G20 Parkinson's disease: Secondary | ICD-10-CM | POA: Diagnosis not present

## 2020-10-23 DIAGNOSIS — Z7982 Long term (current) use of aspirin: Secondary | ICD-10-CM | POA: Diagnosis not present

## 2020-10-23 DIAGNOSIS — N1832 Chronic kidney disease, stage 3b: Secondary | ICD-10-CM | POA: Diagnosis not present

## 2020-10-23 DIAGNOSIS — Z6836 Body mass index (BMI) 36.0-36.9, adult: Secondary | ICD-10-CM | POA: Diagnosis not present

## 2020-10-23 DIAGNOSIS — F419 Anxiety disorder, unspecified: Secondary | ICD-10-CM | POA: Diagnosis not present

## 2020-10-23 DIAGNOSIS — Z9181 History of falling: Secondary | ICD-10-CM | POA: Diagnosis not present

## 2020-10-23 DIAGNOSIS — K529 Noninfective gastroenteritis and colitis, unspecified: Secondary | ICD-10-CM | POA: Diagnosis not present

## 2020-10-23 DIAGNOSIS — R339 Retention of urine, unspecified: Secondary | ICD-10-CM | POA: Diagnosis not present

## 2020-10-23 DIAGNOSIS — I129 Hypertensive chronic kidney disease with stage 1 through stage 4 chronic kidney disease, or unspecified chronic kidney disease: Secondary | ICD-10-CM | POA: Diagnosis not present

## 2020-10-23 DIAGNOSIS — F32A Depression, unspecified: Secondary | ICD-10-CM | POA: Diagnosis not present

## 2020-10-23 DIAGNOSIS — E669 Obesity, unspecified: Secondary | ICD-10-CM | POA: Diagnosis not present

## 2020-10-24 ENCOUNTER — Telehealth: Payer: Self-pay | Admitting: Orthopedic Surgery

## 2020-10-24 NOTE — Telephone Encounter (Signed)
I called and spoke with the spouse and he reports that she is not able to get in and out of the car. I spoke with the therapist and she was told that the patient had to use an ambulance to get back home. With the weather the husband does not want her coming in tomorrow. Is it ok to have the patient come in another day?   I also gave verbal orders to continue her physical therapy for this week. Per Neysa Bonito she is under the impression that the physical therapy may be more extensive due to her parkinson's. Is it ok to extend after the allotted time?   The husband wanted to cancel the appointment for tomorrow and have her come back another day.  Do you have any other recommendations?

## 2020-10-24 NOTE — Telephone Encounter (Signed)
Thank you  She needs to be able to get out of her house and into clinic safely.  It is ok to reschedule her visit.  There are a number of openings Friday if this works for them.  It will be ok for her to be in the splint for another week or so.  Just see what will work for them.  Thanks   Margerite Impastato A. Dallas Schimke, MD MS [name] [address] [place],  [place]  [address] Phone: [phone] Fax: [phone]

## 2020-10-24 NOTE — Telephone Encounter (Signed)
Call received from [place] and from [place], physical therapist for [facility] - relays that patient was unable to transfer when she did patient's home visit this past weekend; therefore, has concerns about patient being able to come to her appointment with Dr Dallas Schimke tomorrow, 10/25/20.  Please call Danford Bad at [phone].

## 2020-10-25 ENCOUNTER — Ambulatory Visit: Payer: Medicare Other | Admitting: Orthopedic Surgery

## 2020-10-25 NOTE — Telephone Encounter (Signed)
I spoke with the patient spouse and he has made arrangement for the patient to come in by a transportation service to bring her back.  No other concerns.

## 2020-10-25 NOTE — Telephone Encounter (Signed)
Call has just been received from patient's son Ivin Booty, one of her contacts on call list, relaying that physical therapist had indicated that patient may need a week before coming out to appointment - relayed that all has been discussed and re-verified with patient regarding her upcoming appointment for tomorrow, 10/26/20. Please advise.

## 2020-10-25 NOTE — Telephone Encounter (Signed)
Noted. Done / patient aware of appointment.

## 2020-10-25 NOTE — Telephone Encounter (Signed)
Patient will come in for a follow up on 10/26/2020.

## 2020-10-26 ENCOUNTER — Ambulatory Visit (INDEPENDENT_AMBULATORY_CARE_PROVIDER_SITE_OTHER): Payer: Medicare Other | Admitting: Orthopedic Surgery

## 2020-10-26 ENCOUNTER — Ambulatory Visit (INDEPENDENT_AMBULATORY_CARE_PROVIDER_SITE_OTHER): Payer: Medicare Other

## 2020-10-26 ENCOUNTER — Other Ambulatory Visit: Payer: Self-pay

## 2020-10-26 ENCOUNTER — Encounter: Payer: Self-pay | Admitting: Orthopedic Surgery

## 2020-10-26 VITALS — BP 126/72 | HR 75 | Ht 62.0 in

## 2020-10-26 DIAGNOSIS — S82201S Unspecified fracture of shaft of right tibia, sequela: Secondary | ICD-10-CM

## 2020-10-26 DIAGNOSIS — S82401S Unspecified fracture of shaft of right fibula, sequela: Secondary | ICD-10-CM

## 2020-10-26 NOTE — Progress Notes (Signed)
Orthopaedic Postop Note  Assessment: Tonya Perez is a 75 y.o. female s/p ORIF of Right ankle fracture  DOS: 10/11/20  Plan: Sutures removed, steri strips placed Well padded short leg cast placed in clinic today NWB on the operative extremity Continue to take Aspirin 81 mg BID She would benefit from general conditioning therapy.  Cast application - right short leg cast   Verbal consent was obtained and the correct extremity was identified. A well padded, appropriately molded short leg cast was applied to the right leg Toes remained warm and well perfused.   There were no sharp edges Patient tolerated the procedure well Cast care instructions were provided    Follow-up: Return in about 4 weeks (around 11/23/2020). XR at next visit: Right ankle  Subjective:  Chief Complaint  Patient presents with  . Ankle Pain    S/P     History of Present Illness: Tonya Perez is a 75 y.o. female who presents following the above stated procedure.  She has done well since surgery.  She is not having pain, and is no longer taking any pain medications.  She requires significant assistance around the house, but she has been spending time out of her bed.  Home health physical therapy has come to the house, but she has not been able to work with them due to restrictions on the operative extremity.  Review of Systems: No fevers or chills No numbness or tingling No Chest Pain No shortness of breath   Objective: BP 126/72   Pulse 75   Ht 5\' 2"  (1.575 m)   BMI 39.15 kg/m   Physical Exam:  Surgical incisions are healing well.  No surrounding erythema or drainage.  Perioperative swelling has significantly improved.  She is able to actively dorsiflex the ankle and the great toe.  Sensation is intact over the dorsum of the foot.   IMAGING: I personally ordered and reviewed the following images  XR right ankle demonstrates maintenance of alignment following ORIF.  No interval subsidence.  No  evidence of hardware failure.   Impression: Healing right ankle fracture s/p ORIF  , MD 10/26/2020 12:09 PM

## 2020-10-26 NOTE — Patient Instructions (Signed)
General Cast Instructions  1.  You were placed in a cast in clinic today.  Please keep the cast material clean, dry and intact.  Please do not use anything to itch the under the cast.  If it gets itchy, you can consider taking benadryl, or similar medication.  If the cast material gets wet, place it on a towel and use a hair dryer on a low setting. 2.  Tylenol or Ibuprofen/Naproxen as needed.   3.  Recommend elevating your extremity as much as possible to help with swelling. 4.  F/u 3 weeks, cast off and repeat XR  

## 2020-10-27 DIAGNOSIS — G20 Parkinson's disease: Secondary | ICD-10-CM | POA: Diagnosis not present

## 2020-10-27 DIAGNOSIS — N1832 Chronic kidney disease, stage 3b: Secondary | ICD-10-CM | POA: Diagnosis not present

## 2020-10-27 DIAGNOSIS — S82851D Displaced trimalleolar fracture of right lower leg, subsequent encounter for closed fracture with routine healing: Secondary | ICD-10-CM | POA: Diagnosis not present

## 2020-10-27 DIAGNOSIS — F32A Depression, unspecified: Secondary | ICD-10-CM | POA: Diagnosis not present

## 2020-10-27 DIAGNOSIS — F419 Anxiety disorder, unspecified: Secondary | ICD-10-CM | POA: Diagnosis not present

## 2020-10-27 DIAGNOSIS — I129 Hypertensive chronic kidney disease with stage 1 through stage 4 chronic kidney disease, or unspecified chronic kidney disease: Secondary | ICD-10-CM | POA: Diagnosis not present

## 2020-10-31 DIAGNOSIS — I129 Hypertensive chronic kidney disease with stage 1 through stage 4 chronic kidney disease, or unspecified chronic kidney disease: Secondary | ICD-10-CM | POA: Diagnosis not present

## 2020-10-31 DIAGNOSIS — G20 Parkinson's disease: Secondary | ICD-10-CM | POA: Diagnosis not present

## 2020-10-31 DIAGNOSIS — N1832 Chronic kidney disease, stage 3b: Secondary | ICD-10-CM | POA: Diagnosis not present

## 2020-10-31 DIAGNOSIS — F419 Anxiety disorder, unspecified: Secondary | ICD-10-CM | POA: Diagnosis not present

## 2020-10-31 DIAGNOSIS — F32A Depression, unspecified: Secondary | ICD-10-CM | POA: Diagnosis not present

## 2020-10-31 DIAGNOSIS — S82851D Displaced trimalleolar fracture of right lower leg, subsequent encounter for closed fracture with routine healing: Secondary | ICD-10-CM | POA: Diagnosis not present

## 2020-11-04 DIAGNOSIS — I129 Hypertensive chronic kidney disease with stage 1 through stage 4 chronic kidney disease, or unspecified chronic kidney disease: Secondary | ICD-10-CM | POA: Diagnosis not present

## 2020-11-04 DIAGNOSIS — S82851D Displaced trimalleolar fracture of right lower leg, subsequent encounter for closed fracture with routine healing: Secondary | ICD-10-CM | POA: Diagnosis not present

## 2020-11-04 DIAGNOSIS — G20 Parkinson's disease: Secondary | ICD-10-CM | POA: Diagnosis not present

## 2020-11-04 DIAGNOSIS — F419 Anxiety disorder, unspecified: Secondary | ICD-10-CM | POA: Diagnosis not present

## 2020-11-04 DIAGNOSIS — N1832 Chronic kidney disease, stage 3b: Secondary | ICD-10-CM | POA: Diagnosis not present

## 2020-11-04 DIAGNOSIS — F32A Depression, unspecified: Secondary | ICD-10-CM | POA: Diagnosis not present

## 2020-11-17 DIAGNOSIS — G20 Parkinson's disease: Secondary | ICD-10-CM | POA: Diagnosis not present

## 2020-11-17 DIAGNOSIS — I129 Hypertensive chronic kidney disease with stage 1 through stage 4 chronic kidney disease, or unspecified chronic kidney disease: Secondary | ICD-10-CM | POA: Diagnosis not present

## 2020-11-17 DIAGNOSIS — N1832 Chronic kidney disease, stage 3b: Secondary | ICD-10-CM | POA: Diagnosis not present

## 2020-11-17 DIAGNOSIS — S82851D Displaced trimalleolar fracture of right lower leg, subsequent encounter for closed fracture with routine healing: Secondary | ICD-10-CM | POA: Diagnosis not present

## 2020-11-17 DIAGNOSIS — F419 Anxiety disorder, unspecified: Secondary | ICD-10-CM | POA: Diagnosis not present

## 2020-11-17 DIAGNOSIS — F32A Depression, unspecified: Secondary | ICD-10-CM | POA: Diagnosis not present

## 2020-11-22 DIAGNOSIS — G20 Parkinson's disease: Secondary | ICD-10-CM | POA: Diagnosis not present

## 2020-11-22 DIAGNOSIS — N1832 Chronic kidney disease, stage 3b: Secondary | ICD-10-CM | POA: Diagnosis not present

## 2020-11-22 DIAGNOSIS — I129 Hypertensive chronic kidney disease with stage 1 through stage 4 chronic kidney disease, or unspecified chronic kidney disease: Secondary | ICD-10-CM | POA: Diagnosis not present

## 2020-11-22 DIAGNOSIS — Z9181 History of falling: Secondary | ICD-10-CM | POA: Diagnosis not present

## 2020-11-22 DIAGNOSIS — E538 Deficiency of other specified B group vitamins: Secondary | ICD-10-CM | POA: Diagnosis not present

## 2020-11-22 DIAGNOSIS — K529 Noninfective gastroenteritis and colitis, unspecified: Secondary | ICD-10-CM | POA: Diagnosis not present

## 2020-11-22 DIAGNOSIS — Z7982 Long term (current) use of aspirin: Secondary | ICD-10-CM | POA: Diagnosis not present

## 2020-11-22 DIAGNOSIS — R339 Retention of urine, unspecified: Secondary | ICD-10-CM | POA: Diagnosis not present

## 2020-11-22 DIAGNOSIS — F32A Depression, unspecified: Secondary | ICD-10-CM | POA: Diagnosis not present

## 2020-11-22 DIAGNOSIS — E669 Obesity, unspecified: Secondary | ICD-10-CM | POA: Diagnosis not present

## 2020-11-22 DIAGNOSIS — F419 Anxiety disorder, unspecified: Secondary | ICD-10-CM | POA: Diagnosis not present

## 2020-11-22 DIAGNOSIS — S82851D Displaced trimalleolar fracture of right lower leg, subsequent encounter for closed fracture with routine healing: Secondary | ICD-10-CM | POA: Diagnosis not present

## 2020-11-22 DIAGNOSIS — H3530 Unspecified macular degeneration: Secondary | ICD-10-CM | POA: Diagnosis not present

## 2020-11-22 DIAGNOSIS — Z6836 Body mass index (BMI) 36.0-36.9, adult: Secondary | ICD-10-CM | POA: Diagnosis not present

## 2020-11-23 ENCOUNTER — Ambulatory Visit (INDEPENDENT_AMBULATORY_CARE_PROVIDER_SITE_OTHER): Payer: Medicare Other

## 2020-11-23 ENCOUNTER — Encounter: Payer: Self-pay | Admitting: Orthopedic Surgery

## 2020-11-23 ENCOUNTER — Ambulatory Visit (INDEPENDENT_AMBULATORY_CARE_PROVIDER_SITE_OTHER): Payer: Medicare Other | Admitting: Orthopedic Surgery

## 2020-11-23 ENCOUNTER — Other Ambulatory Visit: Payer: Self-pay

## 2020-11-23 VITALS — Ht 62.0 in | Wt 214.0 lb

## 2020-11-23 DIAGNOSIS — S82401S Unspecified fracture of shaft of right fibula, sequela: Secondary | ICD-10-CM

## 2020-11-23 DIAGNOSIS — S82201S Unspecified fracture of shaft of right tibia, sequela: Secondary | ICD-10-CM

## 2020-11-23 MED ORDER — CEPHALEXIN 250 MG PO CAPS: 250.0000 mg | ORAL_CAPSULE | Freq: Four times a day (QID) | ORAL | 0 refills | Status: DC

## 2020-11-23 NOTE — Progress Notes (Signed)
Orthopaedic Postop Note  Assessment: Tonya Perez is a 75 y.o. female s/p ORIF of Right ankle fracture  DOS: 10/11/20  Plan: Lateral wound with a small amount of maceration.  Small amount of cloudy fluid.  Incision slightly dehisced with healthy granulation tissue at the base.  No concern for a deep surgical site infection.  Plan to treat prophylactically with Keflex for 10 days.  According to the patient and husband, the cast was shifting and causing some irritation.  This has likely contributed to these symptoms.  Return precautions discussed. Follow up in 2 weeks for incision check.  Transition to walking boot today.  Hold off on weight bearing at this time, ok to rest foot on floor.    Follow-up: Return in about 2 weeks (around 12/07/2020).   XR at next visit: None; wound check only.   Subjective:  Chief Complaint  Patient presents with  . Fracture    History of Present Illness: Tonya Perez is a 75 y.o. female who presents following the above stated procedure.  She was last seen 3-4 weeks ago, and placed in a cast.  No pain in her ankle.  She states the cast was loose and shifting around her ankle.  She has remained NWB.  No numbness or tingling.   Review of Systems: No fevers or chills No numbness or tingling No Chest Pain No shortness of breath   Objective: Ht 5\' 2"  (1.575 m)   Wt 214 lb (97.1 kg)   BMI 39.14 kg/m   Physical Exam:  Lateral incision with a small amount of drainage distally.  Unable to express more fluid.  Small area of dehiscence with healthy appearing granulation tissue at the base.  Minimall tenderness in this area.  Painless passive ROM of the ankle.  Medial incision is healing well.  Foot is without swelling, but exhibits discoloration.  Sensation is intact.    IMAGING: I personally ordered and reviewed the following images  XR right ankle with maintenance of alignment.  Hardware intact.  Disuse osteopenia with small amount of resorption around the  medial screws.   Impression: Healing right distal fibula and medial malleolus fractures following ORIF.   [name], MD 11/23/2020 3:30 PM

## 2020-11-24 ENCOUNTER — Telehealth: Payer: Self-pay

## 2020-11-24 NOTE — Telephone Encounter (Signed)
Please advise on weightbearing status.  

## 2020-11-24 NOTE — Telephone Encounter (Signed)
Clare Gandy physical therapist called from [facility] to see what the patients weight bearing status for the lower right leg.   Patient has appt with physical therapist tomorrow. Please contact physical therapist at [phone].

## 2020-11-24 NOTE — Telephone Encounter (Signed)
Thanks  Can you call and update in the morning.  She has a small area of purulent material, so I gave her some antibiotics.  She can be toe touch weight bearing in her walking boot.   Let me know if there are any more questions.  Elta Guadeloupe

## 2020-11-25 NOTE — Telephone Encounter (Signed)
I called and spoke with Tonya Perez at home health and he will let the office know about the toe touch weight bearing status per Dr. Amedeo Kinsman. He did not have any other questions.

## 2020-11-29 DIAGNOSIS — G20 Parkinson's disease: Secondary | ICD-10-CM | POA: Diagnosis not present

## 2020-11-29 DIAGNOSIS — N1832 Chronic kidney disease, stage 3b: Secondary | ICD-10-CM | POA: Diagnosis not present

## 2020-11-29 DIAGNOSIS — I129 Hypertensive chronic kidney disease with stage 1 through stage 4 chronic kidney disease, or unspecified chronic kidney disease: Secondary | ICD-10-CM | POA: Diagnosis not present

## 2020-11-29 DIAGNOSIS — F419 Anxiety disorder, unspecified: Secondary | ICD-10-CM | POA: Diagnosis not present

## 2020-11-29 DIAGNOSIS — S82851D Displaced trimalleolar fracture of right lower leg, subsequent encounter for closed fracture with routine healing: Secondary | ICD-10-CM | POA: Diagnosis not present

## 2020-11-29 DIAGNOSIS — F32A Depression, unspecified: Secondary | ICD-10-CM | POA: Diagnosis not present

## 2020-12-07 ENCOUNTER — Encounter: Payer: Self-pay | Admitting: Orthopedic Surgery

## 2020-12-07 ENCOUNTER — Ambulatory Visit (INDEPENDENT_AMBULATORY_CARE_PROVIDER_SITE_OTHER): Payer: Medicare Other | Admitting: Orthopedic Surgery

## 2020-12-07 ENCOUNTER — Other Ambulatory Visit: Payer: Self-pay

## 2020-12-07 VITALS — Ht 62.0 in | Wt 214.0 lb

## 2020-12-07 DIAGNOSIS — S82401S Unspecified fracture of shaft of right fibula, sequela: Secondary | ICD-10-CM

## 2020-12-07 DIAGNOSIS — S82201S Unspecified fracture of shaft of right tibia, sequela: Secondary | ICD-10-CM

## 2020-12-07 NOTE — Progress Notes (Signed)
Orthopaedic Postop Note  Assessment: Tonya Perez is a 75 y.o. female s/p ORIF of Right ankle fracture  DOS: 10/11/20  Plan: Small area of the lateral incision, distally, has dried up.  There is a healthy appearing scab that is not tender to palpation.  She tolerated the keflex without issue.  Can advance WB to 50%, with gradual progression to full weight bearing in the boot.  I anticipate that she will progress to outpatient PT, and will require a referral.  Continue to monitor the incisions, but they are healing well at this point.   Follow-up: Return in about 6 weeks (around 01/18/2021).   XR at next visit: XR R ankle  Subjective:  Chief Complaint  Patient presents with  . Routine Post Op  . Wound Check    Rt ankle DOS 10/11/21    History of Present Illness: Tonya Perez is a 75 y.o. female who presents following the above stated procedure.  She was last seen 2 weeks ago and transitioned to a cast.  She had some irritation and drainage of the lateral incision, so we kept her NWB and started keflex.  She has done well with the medication and feels better.  She is anxious to start increasing her weight bearing.  No fevers or chills.    Review of Systems: No fevers or chills No numbness or tingling No Chest Pain No shortness of breath   Objective: Ht 5\' 2"  (1.575 m)   Wt 214 lb (97.1 kg)   BMI 39.14 kg/m   Physical Exam:  Lateral right ankle incision with a small scab at distal aspect of the incision.  No drainage.  It is not tender.  She tolerates gentle range of motion.  No point tenderness.  Medical incision is healing well.  Sensation intact over the dorsum of her foot.  Active motion of the TA/EHL.    IMAGING: I personally ordered and reviewed the following images  No new imaging obtained today   [name], MD 12/07/2020 9:51 PM

## 2020-12-07 NOTE — Patient Instructions (Signed)
OK to advance to 50% weightbearing  You may need a referral to outpatient PT.  Please call if you need anything.

## 2020-12-14 DIAGNOSIS — F32A Depression, unspecified: Secondary | ICD-10-CM | POA: Diagnosis not present

## 2020-12-14 DIAGNOSIS — G20 Parkinson's disease: Secondary | ICD-10-CM | POA: Diagnosis not present

## 2020-12-14 DIAGNOSIS — N1832 Chronic kidney disease, stage 3b: Secondary | ICD-10-CM | POA: Diagnosis not present

## 2020-12-14 DIAGNOSIS — S82851D Displaced trimalleolar fracture of right lower leg, subsequent encounter for closed fracture with routine healing: Secondary | ICD-10-CM | POA: Diagnosis not present

## 2020-12-14 DIAGNOSIS — F419 Anxiety disorder, unspecified: Secondary | ICD-10-CM | POA: Diagnosis not present

## 2020-12-14 DIAGNOSIS — I129 Hypertensive chronic kidney disease with stage 1 through stage 4 chronic kidney disease, or unspecified chronic kidney disease: Secondary | ICD-10-CM | POA: Diagnosis not present

## 2020-12-20 DIAGNOSIS — F419 Anxiety disorder, unspecified: Secondary | ICD-10-CM | POA: Diagnosis not present

## 2020-12-20 DIAGNOSIS — S82851D Displaced trimalleolar fracture of right lower leg, subsequent encounter for closed fracture with routine healing: Secondary | ICD-10-CM | POA: Diagnosis not present

## 2020-12-20 DIAGNOSIS — N1832 Chronic kidney disease, stage 3b: Secondary | ICD-10-CM | POA: Diagnosis not present

## 2020-12-20 DIAGNOSIS — I129 Hypertensive chronic kidney disease with stage 1 through stage 4 chronic kidney disease, or unspecified chronic kidney disease: Secondary | ICD-10-CM | POA: Diagnosis not present

## 2020-12-20 DIAGNOSIS — F32A Depression, unspecified: Secondary | ICD-10-CM | POA: Diagnosis not present

## 2020-12-20 DIAGNOSIS — G20 Parkinson's disease: Secondary | ICD-10-CM | POA: Diagnosis not present

## 2020-12-22 DIAGNOSIS — E669 Obesity, unspecified: Secondary | ICD-10-CM | POA: Diagnosis not present

## 2020-12-22 DIAGNOSIS — E538 Deficiency of other specified B group vitamins: Secondary | ICD-10-CM | POA: Diagnosis not present

## 2020-12-22 DIAGNOSIS — F32A Depression, unspecified: Secondary | ICD-10-CM | POA: Diagnosis not present

## 2020-12-22 DIAGNOSIS — H3530 Unspecified macular degeneration: Secondary | ICD-10-CM | POA: Diagnosis not present

## 2020-12-22 DIAGNOSIS — K529 Noninfective gastroenteritis and colitis, unspecified: Secondary | ICD-10-CM | POA: Diagnosis not present

## 2020-12-22 DIAGNOSIS — N1832 Chronic kidney disease, stage 3b: Secondary | ICD-10-CM | POA: Diagnosis not present

## 2020-12-22 DIAGNOSIS — I129 Hypertensive chronic kidney disease with stage 1 through stage 4 chronic kidney disease, or unspecified chronic kidney disease: Secondary | ICD-10-CM | POA: Diagnosis not present

## 2020-12-22 DIAGNOSIS — F419 Anxiety disorder, unspecified: Secondary | ICD-10-CM | POA: Diagnosis not present

## 2020-12-22 DIAGNOSIS — R339 Retention of urine, unspecified: Secondary | ICD-10-CM | POA: Diagnosis not present

## 2020-12-22 DIAGNOSIS — Z7982 Long term (current) use of aspirin: Secondary | ICD-10-CM | POA: Diagnosis not present

## 2020-12-22 DIAGNOSIS — Z9181 History of falling: Secondary | ICD-10-CM | POA: Diagnosis not present

## 2020-12-22 DIAGNOSIS — Z6836 Body mass index (BMI) 36.0-36.9, adult: Secondary | ICD-10-CM | POA: Diagnosis not present

## 2020-12-22 DIAGNOSIS — S82851D Displaced trimalleolar fracture of right lower leg, subsequent encounter for closed fracture with routine healing: Secondary | ICD-10-CM | POA: Diagnosis not present

## 2020-12-22 DIAGNOSIS — G20 Parkinson's disease: Secondary | ICD-10-CM | POA: Diagnosis not present

## 2020-12-26 ENCOUNTER — Telehealth: Payer: Self-pay | Admitting: Orthopedic Surgery

## 2020-12-26 NOTE — Telephone Encounter (Signed)
Thanks Barnet Pall  We cannot do anything about her motivation to get out of bed.  How is her health otherwise?  Does she feel sick?  Any fevers?  It is very common to see swelling in the operative extremity for several months after surgery, often up to a year.  Ultimately, if they are concerned, they can come and see me earlier.  Most of the time, patients will transition to outpatient PT at this point.  I agree that she needs to be doing more, and should not remain in bed until her next home health PT session.  Let me know how they wish to proceed.    Elta Guadeloupe

## 2020-12-26 NOTE — Telephone Encounter (Signed)
Call from patient(son, [place], one of patient's designated contacts) with question - states for the past 3 days, patient is not getting up out of bed to try to walk. Said having some swelling also. States home care is coming one time per week. Next scheduled appointment date 01/18/21. Please advise. Son Merrily Pew [phone]

## 2020-12-26 NOTE — Telephone Encounter (Signed)
I called and spoke with the son and he reports no other changes in her care and he is aware that I have spoken with physical therapy and they will start his mom back with physical therapy this week or next.  He does not have any questions.

## 2020-12-26 NOTE — Telephone Encounter (Signed)
I called and spoke with the son and he reports that his mom is not wanting to stand much and physical therapy is only coming out once a week. He was concerned that she had a little swelling in her leg but was not sure if it was hot to touch and did not notice any symptoms. He also reports that the spouse and the son try to help her get up and she does not want to walk and will stand for about a minute and then wants to lay back down.   I let know that she may have little swelling if she is not moving much but he wanted to know if you wanted to see her sooner. Please advise.

## 2020-12-28 ENCOUNTER — Telehealth: Payer: Self-pay | Admitting: Orthopedic Surgery

## 2020-12-28 NOTE — Telephone Encounter (Signed)
I called the patient son and let him know the recommendation. He will let me know if they do not start the therapy by the end of the week.

## 2020-12-28 NOTE — Telephone Encounter (Signed)
The patients son had called back and he was demanding to have the number to the office that was helping his mom and I told him that I would call their office and see what the delay was at this time.   I called and spoke with Marjory Lies and he called the therapist and he is going to start the physical therapy on Thursday or Friday this week.

## 2020-12-28 NOTE — Telephone Encounter (Signed)
Patient's son Tonya Perez called.  He wanted the company name and the name of the therapist who saw his Mother. He said the therapist only came once last week and have not been at all this week.  He said he thought they were to come out twice a week.   I told him that I knew that the company name had just changed and I didn't have that information.  I apologized and told him that I would have Dr Amedeo Kinsman' clinical staff call him back with that information.  He said it was fine.  Would you please call him with the home health facility that she is being treated by?  Thanks

## 2020-12-28 NOTE — Telephone Encounter (Signed)
I called and was unable to leave a message with Tonya Perez. I will try again another time.

## 2020-12-30 DIAGNOSIS — F419 Anxiety disorder, unspecified: Secondary | ICD-10-CM | POA: Diagnosis not present

## 2020-12-30 DIAGNOSIS — I129 Hypertensive chronic kidney disease with stage 1 through stage 4 chronic kidney disease, or unspecified chronic kidney disease: Secondary | ICD-10-CM | POA: Diagnosis not present

## 2020-12-30 DIAGNOSIS — F32A Depression, unspecified: Secondary | ICD-10-CM | POA: Diagnosis not present

## 2020-12-30 DIAGNOSIS — N1832 Chronic kidney disease, stage 3b: Secondary | ICD-10-CM | POA: Diagnosis not present

## 2020-12-30 DIAGNOSIS — G20 Parkinson's disease: Secondary | ICD-10-CM | POA: Diagnosis not present

## 2020-12-30 DIAGNOSIS — S82851D Displaced trimalleolar fracture of right lower leg, subsequent encounter for closed fracture with routine healing: Secondary | ICD-10-CM | POA: Diagnosis not present

## 2021-01-03 DIAGNOSIS — S82851D Displaced trimalleolar fracture of right lower leg, subsequent encounter for closed fracture with routine healing: Secondary | ICD-10-CM | POA: Diagnosis not present

## 2021-01-03 DIAGNOSIS — G20 Parkinson's disease: Secondary | ICD-10-CM | POA: Diagnosis not present

## 2021-01-03 DIAGNOSIS — F419 Anxiety disorder, unspecified: Secondary | ICD-10-CM | POA: Diagnosis not present

## 2021-01-03 DIAGNOSIS — N1832 Chronic kidney disease, stage 3b: Secondary | ICD-10-CM | POA: Diagnosis not present

## 2021-01-03 DIAGNOSIS — F32A Depression, unspecified: Secondary | ICD-10-CM | POA: Diagnosis not present

## 2021-01-03 DIAGNOSIS — I129 Hypertensive chronic kidney disease with stage 1 through stage 4 chronic kidney disease, or unspecified chronic kidney disease: Secondary | ICD-10-CM | POA: Diagnosis not present

## 2021-01-05 DIAGNOSIS — I129 Hypertensive chronic kidney disease with stage 1 through stage 4 chronic kidney disease, or unspecified chronic kidney disease: Secondary | ICD-10-CM | POA: Diagnosis not present

## 2021-01-05 DIAGNOSIS — N1832 Chronic kidney disease, stage 3b: Secondary | ICD-10-CM | POA: Diagnosis not present

## 2021-01-05 DIAGNOSIS — G20 Parkinson's disease: Secondary | ICD-10-CM | POA: Diagnosis not present

## 2021-01-05 DIAGNOSIS — S82851D Displaced trimalleolar fracture of right lower leg, subsequent encounter for closed fracture with routine healing: Secondary | ICD-10-CM | POA: Diagnosis not present

## 2021-01-05 DIAGNOSIS — F32A Depression, unspecified: Secondary | ICD-10-CM | POA: Diagnosis not present

## 2021-01-05 DIAGNOSIS — F419 Anxiety disorder, unspecified: Secondary | ICD-10-CM | POA: Diagnosis not present

## 2021-01-10 ENCOUNTER — Encounter: Payer: Self-pay | Admitting: Orthopedic Surgery

## 2021-01-10 ENCOUNTER — Other Ambulatory Visit: Payer: Self-pay

## 2021-01-10 ENCOUNTER — Telehealth: Payer: Self-pay | Admitting: Orthopedic Surgery

## 2021-01-10 ENCOUNTER — Ambulatory Visit (INDEPENDENT_AMBULATORY_CARE_PROVIDER_SITE_OTHER): Payer: Medicare Other | Admitting: Orthopedic Surgery

## 2021-01-10 VITALS — BP 88/73 | HR 77 | Ht 62.0 in

## 2021-01-10 DIAGNOSIS — N1832 Chronic kidney disease, stage 3b: Secondary | ICD-10-CM | POA: Diagnosis not present

## 2021-01-10 DIAGNOSIS — F419 Anxiety disorder, unspecified: Secondary | ICD-10-CM | POA: Diagnosis not present

## 2021-01-10 DIAGNOSIS — I129 Hypertensive chronic kidney disease with stage 1 through stage 4 chronic kidney disease, or unspecified chronic kidney disease: Secondary | ICD-10-CM | POA: Diagnosis not present

## 2021-01-10 DIAGNOSIS — G20 Parkinson's disease: Secondary | ICD-10-CM | POA: Diagnosis not present

## 2021-01-10 DIAGNOSIS — S82401S Unspecified fracture of shaft of right fibula, sequela: Secondary | ICD-10-CM

## 2021-01-10 DIAGNOSIS — S82851D Displaced trimalleolar fracture of right lower leg, subsequent encounter for closed fracture with routine healing: Secondary | ICD-10-CM | POA: Diagnosis not present

## 2021-01-10 DIAGNOSIS — S82201S Unspecified fracture of shaft of right tibia, sequela: Secondary | ICD-10-CM

## 2021-01-10 DIAGNOSIS — F32A Depression, unspecified: Secondary | ICD-10-CM | POA: Diagnosis not present

## 2021-01-10 MED ORDER — CEPHALEXIN 250 MG PO CAPS: 250.0000 mg | ORAL_CAPSULE | Freq: Four times a day (QID) | ORAL | 0 refills | Status: DC

## 2021-01-10 NOTE — Patient Instructions (Signed)
Take Keflex as prescribed  Keep ankle covered  Follow up in one week as previously scheduled

## 2021-01-10 NOTE — H&P (View-Only) (Signed)
Orthopaedic Postop Note  Assessment: Delta Wall is a 75 y.o. female s/p ORIF of Right ankle fracture  DOS: 10/11/20  Plan: A small scab was removed, inadvertently from the lateral incision earlier today.  Unfortunately, this has exposed a small amount of the hardware.  There is some drainage on her sock, but none visible in clinic today.  She also has some tenderness around the incision.  Unfortunately, she likely has a low-grade infection, which was previously treated with Keflex.  She had no issues with this medication, so I provided her with another prescription.  She remains in good health, without fevers or chills.  As a result, we will continue to evaluate her closely, but will likely have to proceed with irrigation and debridement without hardware removal.  Because she is doing well overall, and is not septic, this does not need to be done urgently.  I will see her in a week, to finalize surgical plans.  Follow-up: Return in about 1 week (around 01/17/2021).   XR at next visit: XR R ankle  Subjective:  Chief Complaint  Patient presents with  . Foot Pain    Right foot,    History of Present Illness: Tonya Perez is a 75 y.o. female who presents following the above stated procedure.  She has been recovering well, although her therapy has progressed slowly, secondary to her Parkinson's disease.  During a routine evaluation of the ankle earlier today, a small scab was removed from the lateral incision.  This is exposed of hardware.  According to the patient's husband, there was no drainage until last night.  She has had some drainage on her sock.  No fevers or chills.  She otherwise feels well.  Slight increase in her pain overall.  Review of Systems: No fevers or chills No numbness or tingling No Chest Pain No shortness of breath   Objective: BP (!) 88/73   Pulse 77   Ht 5' 2" (1.575 m)   BMI 39.14 kg/m   Physical Exam:  Mild swelling, diffusely about the right ankle.  The  foot is darker in color, purple overall.  Sensation is intact throughout.  The surgical incision is healing well, with the exception of a small area over the distal aspect of the incision where he can see some of the hardware.  This area is tender to palpation.  There is no drainage or discharge currently, but there is visible drainage on her sock.   IMAGING: I personally ordered and reviewed the following images  No new imaging obtained today   Nimsi Males A Vikash Nest, MD 01/10/2021 3:18 PM 

## 2021-01-10 NOTE — Telephone Encounter (Signed)
I called the patient spouse and she is coming in for a visit on 01/10/21 at 3 pm. No other concerns.

## 2021-01-10 NOTE — Telephone Encounter (Signed)
Call received from patient's spouse, Jenny Reichmann, contact on file, relaying that there has been drainage from wound site; states also that "with a flashlight, can see the metal from the screw".  I offered appointment today with Dr Amedeo Kinsman; states wants to speak with Dr Amedeo Kinsman.

## 2021-01-10 NOTE — Progress Notes (Signed)
Orthopaedic Postop Note  Assessment: Tonya Perez is a 75 y.o. female s/p ORIF of Right ankle fracture  DOS: 10/11/20  Plan: A small scab was removed, inadvertently from the lateral incision earlier today.  Unfortunately, this has exposed a small amount of the hardware.  There is some drainage on her sock, but none visible in clinic today.  She also has some tenderness around the incision.  Unfortunately, she likely has a low-grade infection, which was previously treated with Keflex.  She had no issues with this medication, so I provided her with another prescription.  She remains in good health, without fevers or chills.  As a result, we will continue to evaluate her closely, but will likely have to proceed with irrigation and debridement without hardware removal.  Because she is doing well overall, and is not septic, this does not need to be done urgently.  I will see her in a week, to finalize surgical plans.  Follow-up: Return in about 1 week (around 01/17/2021).   XR at next visit: XR R ankle  Subjective:  Chief Complaint  Patient presents with  . Foot Pain    Right foot,    History of Present Illness: Tonya Perez is a 75 y.o. female who presents following the above stated procedure.  She has been recovering well, although her therapy has progressed slowly, secondary to her Parkinson's disease.  During a routine evaluation of the ankle earlier today, a small scab was removed from the lateral incision.  This is exposed of hardware.  According to the patient's husband, there was no drainage until last night.  She has had some drainage on her sock.  No fevers or chills.  She otherwise feels well.  Slight increase in her pain overall.  Review of Systems: No fevers or chills No numbness or tingling No Chest Pain No shortness of breath   Objective: BP (!) 88/73   Pulse 77   Ht 5\' 2"  (1.575 m)   BMI 39.14 kg/m   Physical Exam:  Mild swelling, diffusely about the right ankle.  The  foot is darker in color, purple overall.  Sensation is intact throughout.  The surgical incision is healing well, with the exception of a small area over the distal aspect of the incision where he can see some of the hardware.  This area is tender to palpation.  There is no drainage or discharge currently, but there is visible drainage on her sock.   IMAGING: I personally ordered and reviewed the following images  No new imaging obtained today   [name], MD 01/10/2021 3:18 PM

## 2021-01-12 ENCOUNTER — Ambulatory Visit: Payer: Self-pay | Admitting: Orthopedic Surgery

## 2021-01-12 DIAGNOSIS — F32A Depression, unspecified: Secondary | ICD-10-CM | POA: Diagnosis not present

## 2021-01-12 DIAGNOSIS — N1832 Chronic kidney disease, stage 3b: Secondary | ICD-10-CM | POA: Diagnosis not present

## 2021-01-12 DIAGNOSIS — F419 Anxiety disorder, unspecified: Secondary | ICD-10-CM | POA: Diagnosis not present

## 2021-01-12 DIAGNOSIS — S82851D Displaced trimalleolar fracture of right lower leg, subsequent encounter for closed fracture with routine healing: Secondary | ICD-10-CM | POA: Diagnosis not present

## 2021-01-12 DIAGNOSIS — G20 Parkinson's disease: Secondary | ICD-10-CM | POA: Diagnosis not present

## 2021-01-12 DIAGNOSIS — I129 Hypertensive chronic kidney disease with stage 1 through stage 4 chronic kidney disease, or unspecified chronic kidney disease: Secondary | ICD-10-CM | POA: Diagnosis not present

## 2021-01-13 ENCOUNTER — Telehealth: Payer: Self-pay | Admitting: Orthopedic Surgery

## 2021-01-13 NOTE — Telephone Encounter (Signed)
The patient spouse call and had made a follow up for 01/18/21 and wants to make sure he does not need to come in the office before her surgery. Please advise.

## 2021-01-13 NOTE — Telephone Encounter (Signed)
No problem - I talked to him about this yesterday.  They do not need to come back and see me before surgery on Thursday.    Let me know if they have any additional questions   Tonya Perez A. Amedeo Kinsman, MD [facility] [address] [place],  [place]  [address] Phone: [phone] Fax: [phone]

## 2021-01-13 NOTE — Telephone Encounter (Signed)
I called the patient spouse and let him know that I had cancelled the appointment. No other concerns.

## 2021-01-16 NOTE — Patient Instructions (Signed)
Tonya Perez  01/16/2021     @PREFPERIOPPHARMACY @   Your procedure is scheduled on  01/19/2021   Report to [facility] at  1100  A.M.   Call this number if you have problems the morning of surgery:  [phone]   Remember:  Do not eat or drink after midnight.                      Take these medicines the morning of surgery with A SIP OF WATER  Xanax (if needed), abilify, wellbutrin, sinemet, zofran (if needed).   Place clean sheets on your bed the night the night before your procedure and DO NOT sleep with pets this night.  Shower with CHG the night before and the morning of your procedure. DO NOT put CHG on your face, hair or genitals.  After each shower, dry off with a clean towel, put on clean, comfortable clothes and brush your teeth.      Do not wear jewelry, make-up or nail polish.  Do not wear lotions, powders, or perfumes, or deodorant.  Do not shave 48 hours prior to surgery.  Men may shave face and neck.  Do not bring valuables to the hospital.  [facility] is not responsible for any belongings or valuables.   Contacts, dentures or bridgework may not be worn into surgery.  Leave your suitcase in the car.  After surgery it may be brought to your room.  For patients admitted to the hospital, discharge time will be determined by your treatment team.  Patients discharged the day of surgery will not be allowed to drive home and must have someone with them for 24 hours.   Special instructions:  DO NOT smoke tobacco or vape the morning of your procedure.   Please read over the following fact sheets that you were given. Coughing and Deep Breathing, Surgical Site Infection Prevention, Anesthesia Post-op Instructions and Care and Recovery After Surgery       Incision and Drainage, Care After This sheet gives you information about how to care for yourself after your procedure. Your health care provider may also give you more specific instructions. If you  have problems or questions, contact your health care provider. What can I expect after the procedure? After the procedure, it is common to have:  Pain or discomfort around the incision site.  Blood, fluid, or pus (drainage) from the incision.  Redness and firm skin around the incision site. Follow these instructions at home: Medicines  Take over-the-counter and prescription medicines only as told by your health care provider.  If you were prescribed an antibiotic medicine, use or take it as told by your health care provider. Do not stop using the antibiotic even if you start to feel better. Wound care Follow instructions from your health care provider about how to take care of your wound. Make sure you:  Wash your hands with soap and water before and after you change your bandage (dressing). If soap and water are not available, use hand sanitizer.  Change your dressing and packing as told by your health care provider. ? If your dressing is dry or stuck when you try to remove it, moisten or wet the dressing with saline or water so that it can be removed without harming your skin or tissues. ? If your wound is packed, leave it in place until your health care provider tells you to remove it. To remove the packing,  moisten or wet the packing with saline or water so that it can be removed without harming your skin or tissues.  Leave stitches (sutures), skin glue, or adhesive strips in place. These skin closures may need to stay in place for 2 weeks or longer. If adhesive strip edges start to loosen and curl up, you may trim the loose edges. Do not remove adhesive strips completely unless your health care provider tells you to do that. Check your wound every day for signs of infection. Check for:  More redness, swelling, or pain.  More fluid or blood.  Warmth.  Pus or a bad smell. If you were sent home with a drain tube in place, follow instructions from your health care provider  about:  How to empty it.  How to care for it at home.   General instructions  Rest the affected area.  Do not take baths, swim, or use a hot tub until your health care provider approves. Ask your health care provider if you may take showers. You may only be allowed to take sponge baths.  Return to your normal activities as told by your health care provider. Ask your health care provider what activities are safe for you. Your health care provider may put you on activity or lifting restrictions.  The incision will continue to drain. It is normal to have some clear or slightly bloody drainage. The amount of drainage should lessen each day.  Do not apply any creams, ointments, or liquids unless you have been told to by your health care provider.  Keep all follow-up visits as told by your health care provider. This is important. Contact a health care provider if:  Your cyst or abscess returns.  You have a fever or chills.  You have more redness, swelling, or pain around your incision.  You have more fluid or blood coming from your incision.  Your incision feels warm to the touch.  You have pus or a bad smell coming from your incision.  You have red streaks above or below the incision site. Get help right away if:  You have severe pain or bleeding.  You cannot eat or drink without vomiting.  You have decreased urine output.  You become short of breath.  You have chest pain.  You cough up blood.  The affected area becomes numb or starts to tingle. These symptoms may represent a serious problem that is an emergency. Do not wait to see if the symptoms will go away. Get medical help right away. Call your local emergency services (911 in the [place] Do not drive yourself to the hospital. Summary  After this procedure, it is common to have fluid, blood, or pus coming from the surgery site.  Follow all home care instructions. You will be told how to take care of your incision,  how to check for infection, and how to take medicines.  If you were prescribed an antibiotic medicine, take it as told by your health care provider. Do not stop taking the antibiotic even if you start to feel better.  Contact a health care provider if you have increased redness, swelling, or pain around your incision. Get help right away if you have chest pain, you vomit, you cough up blood, or you have shortness of breath.  Keep all follow-up visits as told by your health care provider. This is important. This information is not intended to replace advice given to you by your health care provider. Make sure you discuss  any questions you have with your health care provider. Document Revised: 09/08/2018 Document Reviewed: 09/08/2018 Elsevier Patient Education  2021 [place] Anesthesia, Adult, Care After This sheet gives you information about how to care for yourself after your procedure. Your health care provider may also give you more specific instructions. If you have problems or questions, contact your health care provider. What can I expect after the procedure? After the procedure, the following side effects are common:  Pain or discomfort at the IV site.  Nausea.  Vomiting.  Sore throat.  Trouble concentrating.  Feeling cold or chills.  Feeling weak or tired.  Sleepiness and fatigue.  Soreness and body aches. These side effects can affect parts of the body that were not involved in surgery. Follow these instructions at home: For the time period you were told by your health care provider:  Rest.  Do not participate in activities where you could fall or become injured.  Do not drive or use machinery.  Do not drink alcohol.  Do not take sleeping pills or medicines that cause drowsiness.  Do not make important decisions or sign legal documents.  Do not take care of children on your own.   Eating and drinking  Follow any instructions from your health  care provider about eating or drinking restrictions.  When you feel hungry, start by eating small amounts of foods that are soft and easy to digest (bland), such as toast. Gradually return to your regular diet.  Drink enough fluid to keep your urine pale yellow.  If you vomit, rehydrate by drinking water, juice, or clear broth. General instructions  If you have sleep apnea, surgery and certain medicines can increase your risk for breathing problems. Follow instructions from your health care provider about wearing your sleep device: ? Anytime you are sleeping, including during daytime naps. ? While taking prescription pain medicines, sleeping medicines, or medicines that make you drowsy.  Have a responsible adult stay with you for the time you are told. It is important to have someone help care for you until you are awake and alert.  Return to your normal activities as told by your health care provider. Ask your health care provider what activities are safe for you.  Take over-the-counter and prescription medicines only as told by your health care provider.  If you smoke, do not smoke without supervision.  Keep all follow-up visits as told by your health care provider. This is important. Contact a health care provider if:  You have nausea or vomiting that does not get better with medicine.  You cannot eat or drink without vomiting.  You have pain that does not get better with medicine.  You are unable to pass urine.  You develop a skin rash.  You have a fever.  You have redness around your IV site that gets worse. Get help right away if:  You have difficulty breathing.  You have chest pain.  You have blood in your urine or stool, or you vomit blood. Summary  After the procedure, it is common to have a sore throat or nausea. It is also common to feel tired.  Have a responsible adult stay with you for the time you are told. It is important to have someone help care for  you until you are awake and alert.  When you feel hungry, start by eating small amounts of foods that are soft and easy to digest (bland), such as toast. Gradually return to your regular diet.  Drink enough fluid to keep your urine pale yellow.  Return to your normal activities as told by your health care provider. Ask your health care provider what activities are safe for you. This information is not intended to replace advice given to you by your health care provider. Make sure you discuss any questions you have with your health care provider. Document Revised: 06/23/2020 Document Reviewed: 01/21/2020 Elsevier Patient Education  2021 [employer].

## 2021-01-17 ENCOUNTER — Encounter (HOSPITAL_COMMUNITY)
Admission: RE | Admit: 2021-01-17 | Discharge: 2021-01-17 | Disposition: A | Discharge status: Home / Self Care | Payer: Medicare Other | Source: Ambulatory Visit | Attending: Orthopedic Surgery | Admitting: Orthopedic Surgery

## 2021-01-17 ENCOUNTER — Other Ambulatory Visit (HOSPITAL_COMMUNITY)
Admission: RE | Admit: 2021-01-17 | Discharge: 2021-01-17 | Disposition: A | Discharge status: Home / Self Care | Payer: Medicare Other | Source: Ambulatory Visit | Attending: Orthopedic Surgery | Admitting: Orthopedic Surgery

## 2021-01-17 ENCOUNTER — Other Ambulatory Visit: Payer: Self-pay

## 2021-01-17 DIAGNOSIS — Z01812 Encounter for preprocedural laboratory examination: Secondary | ICD-10-CM | POA: Diagnosis not present

## 2021-01-17 DIAGNOSIS — Z20822 Contact with and (suspected) exposure to covid-19: Secondary | ICD-10-CM | POA: Insufficient documentation

## 2021-01-17 LAB — BASIC METABOLIC PANEL
Anion gap: 7 (ref 5–15)
BUN: 23 mg/dL (ref 8–23)
CO2: 27 mmol/L (ref 22–32)
Calcium: 8.7 mg/dL — ABNORMAL LOW (ref 8.9–10.3)
Chloride: 106 mmol/L (ref 98–111)
Creatinine, Ser: 1.63 mg/dL — ABNORMAL HIGH (ref 0.44–1.00)
GFR, Estimated: 33 mL/min — ABNORMAL LOW (ref >60)
Glucose, Bld: 93 mg/dL (ref 70–99)
Potassium: 4.3 mmol/L (ref 3.5–5.1)
Sodium: 140 mmol/L (ref 135–145)

## 2021-01-17 LAB — C-REACTIVE PROTEIN: CRP: 0.6 mg/dL (ref <1.0)

## 2021-01-17 LAB — SARS CORONAVIRUS 2 (TAT 6-24 HRS): SARS Coronavirus 2: NEGATIVE

## 2021-01-17 LAB — CBC
HCT: 39 % (ref 36.0–46.0)
Hemoglobin: 12 g/dL (ref 12.0–15.0)
MCH: 32.5 pg (ref 26.0–34.0)
MCHC: 30.8 g/dL (ref 30.0–36.0)
MCV: 105.7 fL — ABNORMAL HIGH (ref 80.0–100.0)
Platelets: 213 10*3/uL (ref 150–400)
RBC: 3.69 MIL/uL — ABNORMAL LOW (ref 3.87–5.11)
RDW: 15.4 % (ref 11.5–15.5)
WBC: 4.8 10*3/uL (ref 4.0–10.5)
nRBC: 0 % (ref 0.0–0.2)

## 2021-01-17 LAB — SEDIMENTATION RATE: Sed Rate: 96 mm/hr — ABNORMAL HIGH (ref 0–22)

## 2021-01-18 ENCOUNTER — Ambulatory Visit: Payer: Medicare Other | Admitting: Orthopedic Surgery

## 2021-01-19 ENCOUNTER — Ambulatory Visit (HOSPITAL_COMMUNITY): Payer: Medicare Other | Admitting: Certified Registered"

## 2021-01-19 ENCOUNTER — Encounter (HOSPITAL_COMMUNITY)
Admission: RE | Disposition: A | Discharge status: Home / Self Care | Payer: Self-pay | Source: Home / Self Care | Attending: Orthopedic Surgery

## 2021-01-19 ENCOUNTER — Encounter (HOSPITAL_COMMUNITY): Payer: Self-pay | Admitting: Orthopedic Surgery

## 2021-01-19 ENCOUNTER — Ambulatory Visit (HOSPITAL_COMMUNITY)
Admission: RE | Admit: 2021-01-19 | Discharge: 2021-01-19 | Disposition: A | Discharge status: Home / Self Care | Payer: Medicare Other | Attending: Orthopedic Surgery | Admitting: Orthopedic Surgery

## 2021-01-19 ENCOUNTER — Ambulatory Visit (HOSPITAL_COMMUNITY): Payer: Medicare Other

## 2021-01-19 DIAGNOSIS — T84498D Other mechanical complication of other internal orthopedic devices, implants and grafts, subsequent encounter: Secondary | ICD-10-CM

## 2021-01-19 DIAGNOSIS — T8149XD Infection following a procedure, other surgical site, subsequent encounter: Secondary | ICD-10-CM | POA: Diagnosis not present

## 2021-01-19 DIAGNOSIS — S82201S Unspecified fracture of shaft of right tibia, sequela: Secondary | ICD-10-CM

## 2021-01-19 DIAGNOSIS — G20 Parkinson's disease: Secondary | ICD-10-CM | POA: Insufficient documentation

## 2021-01-19 DIAGNOSIS — Y793 Surgical instruments, materials and orthopedic devices (including sutures) associated with adverse incidents: Secondary | ICD-10-CM | POA: Insufficient documentation

## 2021-01-19 DIAGNOSIS — T8141XA Infection following a procedure, superficial incisional surgical site, initial encounter: Secondary | ICD-10-CM | POA: Diagnosis not present

## 2021-01-19 DIAGNOSIS — S91001A Unspecified open wound, right ankle, initial encounter: Secondary | ICD-10-CM | POA: Diagnosis not present

## 2021-01-19 DIAGNOSIS — T8140XA Infection following a procedure, unspecified, initial encounter: Secondary | ICD-10-CM | POA: Diagnosis present

## 2021-01-19 DIAGNOSIS — S82401S Unspecified fracture of shaft of right fibula, sequela: Secondary | ICD-10-CM

## 2021-01-19 DIAGNOSIS — T8489XA Other specified complication of internal orthopedic prosthetic devices, implants and grafts, initial encounter: Secondary | ICD-10-CM | POA: Diagnosis not present

## 2021-01-19 DIAGNOSIS — Z9889 Other specified postprocedural states: Secondary | ICD-10-CM

## 2021-01-19 HISTORY — PX: HARDWARE REMOVAL: SHX979

## 2021-01-19 HISTORY — PX: I & D EXTREMITY: SHX5045

## 2021-01-19 SURGERY — IRRIGATION AND DEBRIDEMENT EXTREMITY
Anesthesia: General | Site: Ankle | Laterality: Right

## 2021-01-19 MED ORDER — FENTANYL CITRATE (PF) 250 MCG/5ML IJ SOLN
INTRAMUSCULAR | Status: AC
  Filled 2021-01-19: qty 5

## 2021-01-19 MED ORDER — BUPIVACAINE-EPINEPHRINE (PF) 0.5% -1:200000 IJ SOLN
INTRAMUSCULAR | Status: AC
  Filled 2021-01-19: qty 30

## 2021-01-19 MED ORDER — ACETAMINOPHEN 500 MG PO TABS: 1000.0000 mg | ORAL_TABLET | Freq: Three times a day (TID) | ORAL | 0 refills | Status: AC

## 2021-01-19 MED ORDER — LACTATED RINGERS IV SOLN
INTRAVENOUS | Status: DC
  Administered 2021-01-19: 1000 mL via INTRAVENOUS

## 2021-01-19 MED ORDER — ORAL CARE MOUTH RINSE: 15.0000 mL | Freq: Once | OROMUCOSAL | Status: AC

## 2021-01-19 MED ORDER — ONDANSETRON HCL 4 MG/2ML IJ SOLN: 4.0000 mg | Freq: Once | INTRAMUSCULAR | Status: DC | PRN

## 2021-01-19 MED ORDER — MIDAZOLAM HCL 2 MG/2ML IJ SOLN
INTRAMUSCULAR | Status: AC
  Filled 2021-01-19: qty 2

## 2021-01-19 MED ORDER — GLYCOPYRROLATE PF 0.2 MG/ML IJ SOSY
PREFILLED_SYRINGE | INTRAMUSCULAR | Status: DC | PRN
  Administered 2021-01-19: .1 mg via INTRAVENOUS

## 2021-01-19 MED ORDER — CEFAZOLIN SODIUM-DEXTROSE 2-4 GM/100ML-% IV SOLN
INTRAVENOUS | Status: AC
  Filled 2021-01-19: qty 100

## 2021-01-19 MED ORDER — OXYCODONE HCL 5 MG PO TABS
5.0000 mg | ORAL_TABLET | Freq: Four times a day (QID) | ORAL | 0 refills | Status: AC | PRN
Start: 2021-01-19 — End: 2021-01-26

## 2021-01-19 MED ORDER — PROPOFOL 10 MG/ML IV BOLUS
INTRAVENOUS | Status: DC | PRN
  Administered 2021-01-19: 30 mg via INTRAVENOUS
  Administered 2021-01-19: 120 mg via INTRAVENOUS
  Administered 2021-01-19: 20 mg via INTRAVENOUS
  Administered 2021-01-19: 30 mg via INTRAVENOUS

## 2021-01-19 MED ORDER — BUPIVACAINE-EPINEPHRINE 0.5% -1:200000 IJ SOLN
INTRAMUSCULAR | Status: DC | PRN
  Administered 2021-01-19: 30 mL

## 2021-01-19 MED ORDER — CEFAZOLIN SODIUM-DEXTROSE 2-4 GM/100ML-% IV SOLN
2.0000 g | INTRAVENOUS | Status: AC
  Administered 2021-01-19: 2 g via INTRAVENOUS

## 2021-01-19 MED ORDER — ONDANSETRON HCL 4 MG/2ML IJ SOLN
INTRAMUSCULAR | Status: DC | PRN
  Administered 2021-01-19: 4 mg via INTRAVENOUS

## 2021-01-19 MED ORDER — LIDOCAINE HCL (PF) 2 % IJ SOLN
INTRAMUSCULAR | Status: AC
  Filled 2021-01-19: qty 5

## 2021-01-19 MED ORDER — VANCOMYCIN HCL 1000 MG IV SOLR
INTRAVENOUS | Status: DC | PRN
  Administered 2021-01-19: 1000 mg

## 2021-01-19 MED ORDER — ONDANSETRON HCL 4 MG PO TABS: 4.0000 mg | ORAL_TABLET | Freq: Three times a day (TID) | ORAL | 0 refills | Status: AC | PRN

## 2021-01-19 MED ORDER — FENTANYL CITRATE (PF) 100 MCG/2ML IJ SOLN
INTRAMUSCULAR | Status: DC | PRN
  Administered 2021-01-19 (×2): 25 ug via INTRAVENOUS
  Administered 2021-01-19: 50 ug via INTRAVENOUS

## 2021-01-19 MED ORDER — EPHEDRINE 5 MG/ML INJ
INTRAVENOUS | Status: AC
  Filled 2021-01-19: qty 10

## 2021-01-19 MED ORDER — EPHEDRINE SULFATE-NACL 50-0.9 MG/10ML-% IV SOSY
PREFILLED_SYRINGE | INTRAVENOUS | Status: DC | PRN
  Administered 2021-01-19: 5 mg via INTRAVENOUS

## 2021-01-19 MED ORDER — DEXAMETHASONE SODIUM PHOSPHATE 10 MG/ML IJ SOLN
INTRAMUSCULAR | Status: AC
  Filled 2021-01-19: qty 1

## 2021-01-19 MED ORDER — SODIUM CHLORIDE 0.9 % IR SOLN
Status: DC | PRN
  Administered 2021-01-19: 3000 mL
  Administered 2021-01-19: 1000 mL

## 2021-01-19 MED ORDER — LIDOCAINE 2% (20 MG/ML) 5 ML SYRINGE
INTRAMUSCULAR | Status: DC | PRN
  Administered 2021-01-19: 100 mg via INTRAVENOUS

## 2021-01-19 MED ORDER — CHLORHEXIDINE GLUCONATE 0.12 % MT SOLN
15.0000 mL | Freq: Once | OROMUCOSAL | Status: AC
  Administered 2021-01-19: 15 mL via OROMUCOSAL
  Filled 2021-01-19: qty 15

## 2021-01-19 MED ORDER — FENTANYL CITRATE (PF) 100 MCG/2ML IJ SOLN: 25.0000 ug | INTRAMUSCULAR | Status: DC | PRN

## 2021-01-19 MED ORDER — ONDANSETRON HCL 4 MG/2ML IJ SOLN
INTRAMUSCULAR | Status: AC
  Filled 2021-01-19: qty 2

## 2021-01-19 MED ORDER — DEXAMETHASONE SODIUM PHOSPHATE 4 MG/ML IJ SOLN
INTRAMUSCULAR | Status: DC | PRN
  Administered 2021-01-19: 6 mg via INTRAVENOUS

## 2021-01-19 MED ORDER — CHLORHEXIDINE GLUCONATE 0.12 % MT SOLN
OROMUCOSAL | Status: AC
  Filled 2021-01-19: qty 15

## 2021-01-19 MED ORDER — GLYCOPYRROLATE PF 0.2 MG/ML IJ SOSY
PREFILLED_SYRINGE | INTRAMUSCULAR | Status: AC
  Filled 2021-01-19: qty 1

## 2021-01-19 SURGICAL SUPPLY — 42 items
BANDAGE ESMARK 4X12 BL STRL LF (DISPOSABLE) ×1 IMPLANT
BNDG CMPR 12X4 ELC STRL LF (DISPOSABLE) ×1
BNDG CMPR STD VLCR NS LF 5.8X4 (GAUZE/BANDAGES/DRESSINGS)
BNDG COHESIVE 4X5 TAN STRL (GAUZE/BANDAGES/DRESSINGS) IMPLANT
BNDG ELASTIC 4X5.8 VLCR NS LF (GAUZE/BANDAGES/DRESSINGS) IMPLANT
BNDG ESMARK 4X12 BLUE STRL LF (DISPOSABLE) ×2
BNDG GAUZE ELAST 4 BULKY (GAUZE/BANDAGES/DRESSINGS) IMPLANT
CLOTH BEACON ORANGE TIMEOUT ST (SAFETY) ×2 IMPLANT
COVER LIGHT HANDLE STERIS (MISCELLANEOUS) ×4 IMPLANT
COVER WAND RF STERILE (DRAPES) ×2 IMPLANT
CUFF TOURN SGL QUICK 34 (TOURNIQUET CUFF) ×2
CUFF TRNQT CYL 34X4.125X (TOURNIQUET CUFF) ×1 IMPLANT
DRAPE HALF SHEET 40X57 (DRAPES) ×2 IMPLANT
ELECT REM PT RETURN 9FT ADLT (ELECTROSURGICAL) ×2
ELECTRODE REM PT RTRN 9FT ADLT (ELECTROSURGICAL) ×1 IMPLANT
GAUZE SPONGE 4X4 12PLY STRL (GAUZE/BANDAGES/DRESSINGS) ×2 IMPLANT
GAUZE XEROFORM 1X8 LF (GAUZE/BANDAGES/DRESSINGS) ×2 IMPLANT
GLOVE SKINSENSE NS SZ8.0 LF (GLOVE) ×3
GLOVE SKINSENSE STRL SZ8.0 LF (GLOVE) ×3 IMPLANT
GLOVE SRG 8 PF TXTR STRL LF DI (GLOVE) ×1 IMPLANT
GLOVE SURG UNDER POLY LF SZ7 (GLOVE) ×4 IMPLANT
GLOVE SURG UNDER POLY LF SZ8 (GLOVE) ×2
GOWN STRL REUS W/ TWL XL LVL3 (GOWN DISPOSABLE) ×1 IMPLANT
GOWN STRL REUS W/TWL LRG LVL3 (GOWN DISPOSABLE) ×2 IMPLANT
GOWN STRL REUS W/TWL XL LVL3 (GOWN DISPOSABLE) ×2
INST SET MINOR BONE (KITS) ×2 IMPLANT
IV NS IRRIG 3000ML ARTHROMATIC (IV SOLUTION) ×2 IMPLANT
KIT TURNOVER KIT A (KITS) ×2 IMPLANT
MANIFOLD NEPTUNE II (INSTRUMENTS) ×2 IMPLANT
NS IRRIG 1000ML POUR BTL (IV SOLUTION) ×2 IMPLANT
PACK BASIC LIMB (CUSTOM PROCEDURE TRAY) ×2 IMPLANT
PAD ABD 8X10 STRL (GAUZE/BANDAGES/DRESSINGS) ×4 IMPLANT
PAD ARMBOARD 7.5X6 YLW CONV (MISCELLANEOUS) ×2 IMPLANT
PAD CAST 4YDX4 CTTN HI CHSV (CAST SUPPLIES) ×1 IMPLANT
PADDING CAST COTTON 4X4 STRL (CAST SUPPLIES) ×2
SET BASIN LINEN APH (SET/KITS/TRAYS/PACK) ×2 IMPLANT
SET CYSTO W/LG BORE CLAMP LF (SET/KITS/TRAYS/PACK) ×2 IMPLANT
SOL PREP PROV IODINE SCRUB 4OZ (MISCELLANEOUS) ×2 IMPLANT
SUT ETHILON 3 0 FSL (SUTURE) ×2 IMPLANT
SUT MON AB 2-0 CT1 36 (SUTURE) ×2 IMPLANT
SYR BULB IRRIG 60ML STRL (SYRINGE) ×2 IMPLANT
YANKAUER SUCT BULB TIP NO VENT (SUCTIONS) ×2 IMPLANT

## 2021-01-19 NOTE — Progress Notes (Signed)
Patients husband updated that she has gone into OR for her procedure.

## 2021-01-19 NOTE — Anesthesia Preprocedure Evaluation (Signed)
Anesthesia Evaluation  Patient identified by MRN, date of birth, ID band Patient awake    Reviewed: Allergy & Precautions, NPO status , Patient's Chart, lab work & pertinent test results  History of Anesthesia Complications Negative for: history of anesthetic complications  Airway Mallampati: III  TM Distance: >3 FB Neck ROM: Full    Dental  (+) Dental Advisory Given, Missing, Chipped   Pulmonary neg pulmonary ROS,    Pulmonary exam normal breath sounds clear to auscultation       Cardiovascular Exercise Tolerance: Poor hypertension, Pt. on medications Normal cardiovascular exam Rhythm:Regular Rate:Normal     Neuro/Psych PSYCHIATRIC DISORDERS Anxiety Depression  Neuromuscular disease (Parkinsonism)    GI/Hepatic negative GI ROS, Neg liver ROS,   Endo/Other  negative endocrine ROS  Renal/GU Renal InsufficiencyRenal disease (AKI)     Musculoskeletal  (+) Arthritis ,   Abdominal   Peds  Hematology negative hematology ROS (+)   Anesthesia Other Findings   Reproductive/Obstetrics negative OB ROS                             Anesthesia Physical  Anesthesia Plan  ASA: III  Anesthesia Plan: General   Post-op Pain Management:  Regional for Post-op pain   Induction: Intravenous  PONV Risk Score and Plan: 4 or greater and Ondansetron and Dexamethasone  Airway Management Planned: LMA  Additional Equipment:   Intra-op Plan:   Post-operative Plan: Extubation in OR  Informed Consent: I have reviewed the patients History and Physical, chart, labs and discussed the procedure including the risks, benefits and alternatives for the proposed anesthesia with the patient or authorized representative who has indicated his/her understanding and acceptance.     Dental advisory given  Plan Discussed with: CRNA and Surgeon  Anesthesia Plan Comments:         Anesthesia Quick  Evaluation

## 2021-01-19 NOTE — Discharge Instructions (Signed)
Mark A. Amedeo Kinsman, MD [facility] [address] [place],  [place]  [address] Phone: [phone] Fax: [phone]   [place] ? You can remove the bulky dressing in 3 days.  Please cover the incision with a clean, dry dressing until your follow up appointment.  ? You can shower after the surgery dressing has been removed.  Please do not submerge the incision in a bath.  EXERCISES ? You can continue to bear weight as tolerated with the walking boot in place.  This may be difficult with the bulky surgical dressing in place.  If the boot does not comfortably fit, it is ok to keep the boot off until the first dressing has been removed.   REGIONAL ANESTHESIA (NERVE BLOCKS) . The anesthesia team may have performed a nerve block for you if safe in the setting of your care.  This is a great tool used to minimize pain.  Typically the block may start wearing off overnight but the long acting medicine may last for 3-4 days.  The nerve block wearing off can be a challenging period but please utilize your as needed pain medications to try and manage this period.    POST-OP MEDICATIONS- Multimodal approach to pain control . In general your pain will be controlled with a combination of substances.  Prescriptions unless otherwise discussed are electronically sent to your pharmacy.  This is a carefully made plan we use to minimize narcotic use.     - Acetaminophen - Non-narcotic pain medicine taken on a scheduled basis   - Oxycodone - This is a strong narcotic, to be used only on an "as needed" basis for pain.  Limited prescription.   -  Aspirin 81mg  - This medicine is used to minimize the risk of blood clots after surgery.             -          Zofran - take as needed for nausea   FOLLOW-UP ? If you develop a Fever (>101.5), Redness or Drainage from the surgical incision site, please call our office to arrange for an  evaluation. ? Please call the office to schedule a follow-up appointment for your incision check if you do not already have one, 10-14 days post-operatively.  IF YOU HAVE ANY QUESTIONS, PLEASE FEEL FREE TO CALL OUR OFFICE.  HELPFUL INFORMATION  ? If you had a block, it will wear off between 8-24 hrs postop typically.  This is period when your pain may go from nearly zero to the pain you would have had postop without the block.  This is an abrupt transition but nothing dangerous is happening.  You may take an extra dose of narcotic when this happens.  ? You should wean off your narcotic medicines as soon as you are able.  Most patients will be off or using minimal narcotics before their first postop appointment.   ? Elevating your leg will help with swelling and pain control.  You are encouraged to elevate your leg as much as possible in the first couple of weeks following surgery.  Imagine a drop of water on your toe, and your goal is to get that water back to your heart.  ? We suggest you use the pain medication the first night prior to going to bed, in order to ease any pain when the anesthesia wears off. You should avoid taking pain medications on an empty  stomach as it will make you nauseous.  ? Do not drink alcoholic beverages or take illicit drugs when taking pain medications.  ? In most states it is against the law to drive while you are in a splint or sling.  And certainly against the law to drive while taking narcotics.  ? You may return to work/school in the next couple of days when you feel up to it.   ? Pain medication may make you constipated.  Below are a few solutions to try in this order: - Decrease the amount of pain medication if you aren't having pain. - Drink lots of decaffeinated fluids. - Drink prune juice and/or each dried prunes  o If the first 3 don't work start with additional solutions - Take Colace - an over-the-counter stool softener - Take Senokot - an  over-the-counter laxative - Take Miralax - a stronger over-the-counter laxative     General Anesthesia, Adult, Care After This sheet gives you information about how to care for yourself after your procedure. Your health care provider may also give you more specific instructions. If you have problems or questions, contact your health care provider. What can I expect after the procedure? After the procedure, the following side effects are common:  Pain or discomfort at the IV site.  Nausea.  Vomiting.  Sore throat.  Trouble concentrating.  Feeling cold or chills.  Feeling weak or tired.  Sleepiness and fatigue.  Soreness and body aches. These side effects can affect parts of the body that were not involved in surgery. Follow these instructions at home: For the time period you were told by your health care provider:  Rest.  Do not participate in activities where you could fall or become injured.  Do not drive or use machinery.  Do not drink alcohol.  Do not take sleeping pills or medicines that cause drowsiness.  Do not make important decisions or sign legal documents.  Do not take care of children on your own.   Eating and drinking  Follow any instructions from your health care provider about eating or drinking restrictions.  When you feel hungry, start by eating small amounts of foods that are soft and easy to digest (bland), such as toast. Gradually return to your regular diet.  Drink enough fluid to keep your urine pale yellow.  If you vomit, rehydrate by drinking water, juice, or clear broth. General instructions  If you have sleep apnea, surgery and certain medicines can increase your risk for breathing problems. Follow instructions from your health care provider about wearing your sleep device: ? Anytime you are sleeping, including during daytime naps. ? While taking prescription pain medicines, sleeping medicines, or medicines that make you  drowsy.  Have a responsible adult stay with you for the time you are told. It is important to have someone help care for you until you are awake and alert.  Return to your normal activities as told by your health care provider. Ask your health care provider what activities are safe for you.  Take over-the-counter and prescription medicines only as told by your health care provider.  If you smoke, do not smoke without supervision.  Keep all follow-up visits as told by your health care provider. This is important. Contact a health care provider if:  You have nausea or vomiting that does not get better with medicine.  You cannot eat or drink without vomiting.  You have pain that does not get better with medicine.  You are  unable to pass urine.  You develop a skin rash.  You have a fever.  You have redness around your IV site that gets worse. Get help right away if:  You have difficulty breathing.  You have chest pain.  You have blood in your urine or stool, or you vomit blood. Summary  After the procedure, it is common to have a sore throat or nausea. It is also common to feel tired.  Have a responsible adult stay with you for the time you are told. It is important to have someone help care for you until you are awake and alert.  When you feel hungry, start by eating small amounts of foods that are soft and easy to digest (bland), such as toast. Gradually return to your regular diet.  Drink enough fluid to keep your urine pale yellow.  Return to your normal activities as told by your health care provider. Ask your health care provider what activities are safe for you. This information is not intended to replace advice given to you by your health care provider. Make sure you discuss any questions you have with your health care provider. Document Revised: 06/23/2020 Document Reviewed: 01/21/2020 Elsevier Patient Education  2021 [employer].

## 2021-01-19 NOTE — Op Note (Signed)
Orthopaedic Surgery Operative Note (CSN: [id number])  Tonya Perez  August 21, 1946 Date of Surgery: 01/19/2021   Diagnoses:  Right ankle surgical site infection  Procedure: Irrigation and debridement, removal of hardware and revision closure of right ankle    Operative Finding Successful completion of the planned procedure.  3 L normal saline in lateral right ankle.  Removal of prominent screw with revision incision.  No gross purulence encountered.  Incision closed over 1 g of Vancomycin powder without tension.     Post-Op Diagnosis: Same Surgeons:Primary: [name], MD Assistants:  Nonr Location: [facility] OR ROOM 4 Anesthesia: General with local anesthesia Antibiotics: Ancef 2 g with local vancomycin powder 1 g at the surgical site Tourniquet time:  Total Tourniquet Time Documented: Thigh (Left) - 47 minutes Total: Thigh (Left) - 47 minutes  Estimated Blood Loss: Minimal Complications: None Specimens: None Implants: Implant Name Type Inv. Item Serial No. Manufacturer Lot No. LRB No. Used Action  SCREW LOCKING 2.7X12 ANKLE - SSTERILE ON SET Screw SCREW LOCKING 2.7X12 ANKLE STERILE ON SET [place]  Right 1 Explanted    Indications for Surgery:   Tonya Perez is a 75 y.o. female who sustained a right ankle fracture and underwent operative fixation on October 11, 2020.  She recovered well, but did have some increased drainage during the early postoperative period.  This responded well to p.o. antibiotics.  However, at her last postoperative visit, approximately 3 months out from surgery, a small scab had been removed and there was exposed hardware.  She was once again placed on p.o. antibiotics, and we plan to return to the operating room for irrigation debridement.  She has remained afebrile.  Preoperative labs are without a white count.  ESR was elevated.  Benefits and risks of operative and nonoperative management were discussed prior to surgery with patient/guardian(s) and informed  consent form was completed.  Specific risks including infection, need for additional surgery, persistent pain, bleeding, malunion, nonunion, damage to surrounding structures and more severe complications associated with anesthesia.  The patient and her family elected to proceed with surgery.   Procedure:   The patient was identified properly. Informed consent was obtained and the surgical site was marked. The patient was taken up to suite where general anesthesia was induced.  The patient was positioned supine.  The right leg was prepped and draped in the usual sterile fashion.  Timeout was performed before the beginning of the case.  Tourniquet was used for the above duration.  Preoperative antibiotics were dosed appropriately.  We identified the lateral incision.  Within the distal half of this incision, there is a small area with visible hardware.  Specifically, a single screw was identified.  This was associated with some increased drainage.  There was no visible purulent material.  We incised sharply through the previous surgical incision, and carefully ellipsed around the exposed hardware.  The skin in this area was very friable.  The hardware was then exposed over the distal one half of the incision.  At this point, fluoroscopy was used to evaluate the ankle.  Orthogonal views under fluoroscopy demonstrated stable fixation.  There is no evidence of acute injury.  Nor was there evidence of delayed union.  The screws were then evaluated and noted to still have excellent purchase.  Due to the stability of the ankle, and the prominence of the screw which was exposed, I made the decision to remove the screw and not replace it.  We then proceeded to irrigate with  3 L of normal saline using cystoscopy tubing.  Light debridement was completed with sponges.  Once again there was no pocket of infection or purulent material encountered.  After irrigation, we turned our attention back to the wound.  In order to  ensure that the incision was closed without tension, we proceeded to undermine the tissue both anteriorly and posteriorly.  Once both parts of the incision had sufficient excursion, we then proceeded to close in a layered fashion.  Immediately prior to starting the closure, 1 g of Vancomycin powder was placed within the wound bed, directly on top of the exposed hardware.  We then closed the incision in a multilayer fashion with 2-0 Monocryl, followed by 3-0 horizontal mattress sutures in the skin.  Marcaine was then injected around the incision.  Sterile dressing was placed.  Patient was awoken taken to PACU in stable condition.  Post-operative plan:  The patient will be weightbearing as tolerated in her walking boot. The surgical dressing can be removed in 3 days, but the incision should remain covered. DVT prophylaxis Aspirin 81 mg to continue until the sutures are removed Pain control with PRN pain medication preferring oral medicines.   Follow up plan will be scheduled in approximately 14 days for incision check and XR.

## 2021-01-19 NOTE — Anesthesia Postprocedure Evaluation (Signed)
Anesthesia Post Note  Patient: Tonya Perez  Procedure(s) Performed: IRRIGATION AND DEBRIDEMENT OF RIGHT ANKLE SURGICAL SITE INFECTION (Right Ankle) HARDWARE REMOVALOFONE SCREW iIN RIGHT ANKLE (Right Ankle)  Patient location during evaluation: PACU Anesthesia Type: General Level of consciousness: awake and alert and oriented Pain management: pain level controlled Vital Signs Assessment: post-procedure vital signs reviewed and stable Respiratory status: spontaneous breathing and respiratory function stable Cardiovascular status: blood pressure returned to baseline and stable Postop Assessment: no apparent nausea or vomiting Anesthetic complications: no   No complications documented.   Last Vitals:  Vitals:   01/19/21 1545 01/19/21 1600  BP: 124/65 (!) 111/91  Pulse: 74 69  Resp: 14 12  Temp:    SpO2: 95% 94%    Last Pain:  Vitals:   01/19/21 1600  TempSrc:   PainSc: 0-No pain                 Nivaan Dicenzo C Margie Brink

## 2021-01-19 NOTE — Anesthesia Procedure Notes (Signed)
Procedure Name: LMA Insertion Date/Time: 01/19/2021 2:10 PM Performed by: Orlie Dakin, CRNA Pre-anesthesia Checklist: Patient identified, Emergency Drugs available, Suction available and Patient being monitored Patient Re-evaluated:Patient Re-evaluated prior to induction Oxygen Delivery Method: Circle system utilized Preoxygenation: Pre-oxygenation with 100% oxygen Induction Type: IV induction Ventilation: Mask ventilation without difficulty LMA: LMA inserted LMA Size: 4.0 Tube type: Oral Number of attempts: 1 Placement Confirmation: positive ETCO2 Tube secured with: Tape Dental Injury: Teeth and Oropharynx as per pre-operative assessment

## 2021-01-19 NOTE — Interval H&P Note (Signed)
History and Physical Interval Note:  01/19/2021 1:29 PM  Renea Schoonmaker  has presented today for surgery, with the diagnosis of Right ankle surgical site infection.  The various methods of treatment have been discussed with the patient and family. After consideration of risks, benefits and other options for treatment, the patient has consented to  Procedure(s) with comments: IRRIGATION AND DEBRIDEMENT EXTREMITY (Right) - Irrigation and debridement of surgical site infection, right ankle as a surgical intervention.  The patient's history has been reviewed, patient examined, no change in status, stable for surgery.  I have reviewed the patient's chart and labs.  Questions were answered to the patient's satisfaction.    ORIF R ankle 10/11/20.  Developed some drainage over lateral ankle 1-2 months postop, treated with PO antibiotics with good effect.  No issues until last week when small scab removed and drainage increased.  Lateral hardware visible through small portion of the incision.  Plan to take to OR for I&D and further evaluation.  Do not plan to remove hardware but will assess stability.  Will revise incision.  All questions answered and she still wishes to proceed.   [name]

## 2021-01-19 NOTE — Transfer of Care (Signed)
Immediate Anesthesia Transfer of Care Note  Patient: Tonya Perez  Procedure(s) Performed: IRRIGATION AND DEBRIDEMENT OF RIGHT ANKLE SURGICAL SITE INFECTION (Right Ankle) HARDWARE REMOVALOFONE SCREW iIN RIGHT ANKLE (Right Ankle)  Patient Location: PACU  Anesthesia Type:General  Level of Consciousness: drowsy  Airway & Oxygen Therapy: Patient Spontanous Breathing and Patient connected to face mask oxygen  Post-op Assessment: Report given to RN and Post -op Vital signs reviewed and stable  Post vital signs: Reviewed and stable  Last Vitals:  Vitals Value Taken Time  BP 127/61 01/19/21 1528  Temp    Pulse 75 01/19/21 1530  Resp 13 01/19/21 1530  SpO2 100 % 01/19/21 1530  Vitals shown include unvalidated device data.  Last Pain:  Vitals:   01/19/21 1119  TempSrc: Oral  PainSc: 0-No pain      Patients Stated Pain Goal: 5 (47/53/39 1792)  Complications: No complications documented.

## 2021-01-20 ENCOUNTER — Encounter (HOSPITAL_COMMUNITY): Payer: Self-pay | Admitting: Orthopedic Surgery

## 2021-01-21 DIAGNOSIS — Z9181 History of falling: Secondary | ICD-10-CM | POA: Diagnosis not present

## 2021-01-21 DIAGNOSIS — Z7982 Long term (current) use of aspirin: Secondary | ICD-10-CM | POA: Diagnosis not present

## 2021-01-21 DIAGNOSIS — F419 Anxiety disorder, unspecified: Secondary | ICD-10-CM | POA: Diagnosis not present

## 2021-01-21 DIAGNOSIS — H3530 Unspecified macular degeneration: Secondary | ICD-10-CM | POA: Diagnosis not present

## 2021-01-21 DIAGNOSIS — G20 Parkinson's disease: Secondary | ICD-10-CM | POA: Diagnosis not present

## 2021-01-21 DIAGNOSIS — I129 Hypertensive chronic kidney disease with stage 1 through stage 4 chronic kidney disease, or unspecified chronic kidney disease: Secondary | ICD-10-CM | POA: Diagnosis not present

## 2021-01-21 DIAGNOSIS — K529 Noninfective gastroenteritis and colitis, unspecified: Secondary | ICD-10-CM | POA: Diagnosis not present

## 2021-01-21 DIAGNOSIS — E538 Deficiency of other specified B group vitamins: Secondary | ICD-10-CM | POA: Diagnosis not present

## 2021-01-21 DIAGNOSIS — Z6836 Body mass index (BMI) 36.0-36.9, adult: Secondary | ICD-10-CM | POA: Diagnosis not present

## 2021-01-21 DIAGNOSIS — S82851D Displaced trimalleolar fracture of right lower leg, subsequent encounter for closed fracture with routine healing: Secondary | ICD-10-CM | POA: Diagnosis not present

## 2021-01-21 DIAGNOSIS — E669 Obesity, unspecified: Secondary | ICD-10-CM | POA: Diagnosis not present

## 2021-01-21 DIAGNOSIS — N1832 Chronic kidney disease, stage 3b: Secondary | ICD-10-CM | POA: Diagnosis not present

## 2021-01-21 DIAGNOSIS — F32A Depression, unspecified: Secondary | ICD-10-CM | POA: Diagnosis not present

## 2021-01-21 DIAGNOSIS — R339 Retention of urine, unspecified: Secondary | ICD-10-CM | POA: Diagnosis not present

## 2021-01-24 DIAGNOSIS — N1832 Chronic kidney disease, stage 3b: Secondary | ICD-10-CM | POA: Diagnosis not present

## 2021-01-24 DIAGNOSIS — F419 Anxiety disorder, unspecified: Secondary | ICD-10-CM | POA: Diagnosis not present

## 2021-01-24 DIAGNOSIS — I129 Hypertensive chronic kidney disease with stage 1 through stage 4 chronic kidney disease, or unspecified chronic kidney disease: Secondary | ICD-10-CM | POA: Diagnosis not present

## 2021-01-24 DIAGNOSIS — F32A Depression, unspecified: Secondary | ICD-10-CM | POA: Diagnosis not present

## 2021-01-24 DIAGNOSIS — S82851D Displaced trimalleolar fracture of right lower leg, subsequent encounter for closed fracture with routine healing: Secondary | ICD-10-CM | POA: Diagnosis not present

## 2021-01-24 DIAGNOSIS — G20 Parkinson's disease: Secondary | ICD-10-CM | POA: Diagnosis not present

## 2021-02-01 ENCOUNTER — Other Ambulatory Visit: Payer: Self-pay

## 2021-02-01 ENCOUNTER — Encounter: Payer: Self-pay | Admitting: Orthopedic Surgery

## 2021-02-01 ENCOUNTER — Ambulatory Visit (INDEPENDENT_AMBULATORY_CARE_PROVIDER_SITE_OTHER): Payer: Medicare Other | Admitting: Orthopedic Surgery

## 2021-02-01 VITALS — Ht 62.0 in | Wt 204.0 lb

## 2021-02-01 DIAGNOSIS — T8131XD Disruption of external operation (surgical) wound, not elsewhere classified, subsequent encounter: Secondary | ICD-10-CM

## 2021-02-01 DIAGNOSIS — S82201S Unspecified fracture of shaft of right tibia, sequela: Secondary | ICD-10-CM

## 2021-02-01 DIAGNOSIS — S82401S Unspecified fracture of shaft of right fibula, sequela: Secondary | ICD-10-CM

## 2021-02-01 NOTE — Progress Notes (Signed)
Orthopaedic Postop Note  Assessment: Tonya Perez is a 75 y.o. female s/p ORIF of Right ankle fracture (03/88/8280), complicated by wound drainage and breakdown requiring I&D, removal of screw and revision closure (01/19/2021)  Plan: Healing well.  Sutures removed, steri strips placed Keep steri strips in place for another week.   Keep incision clean and dry; dressing as needed Ok to remove walking boot Resume HH PT F/u 4 weeks   Follow-up: Return in about 4 weeks (around 03/01/2021).   XR at next visit: XR R ankle  Subjective:  Chief Complaint  Patient presents with  . Routine Post Op    Rt ankle ORIF 10/11/20 and I&D 01/19/21     History of Present Illness: Tonya Perez is a 75 y.o. female who presents following the above stated procedure.  She has done well since her most recent procedure.  Pain is well controlled.  NO fevers or chills.  HH PT has stopped working with her during her immediate recovery, but they did remove the surgical dressing.  Patient relies heavily on her husband for assistance due to h/o Parkinson's.  Review of Systems: No fevers or chills No numbness or tingling No Chest Pain No shortness of breath   Objective: Ht 5\' 2"  (1.575 m)   Wt 204 lb (92.5 kg)   BMI 37.31 kg/m   Physical Exam:  Right ankle with minimal swelling.  Incision is healing well without surrounding erythema or drainage.  Toes are warm and well perfused.  She tolerates passive ROM of the ankle.  No tenderness over the lateral ankle.    IMAGING: I personally ordered and reviewed the following images  No new imaging obtained today   Tonya Rasmussen, MD 02/01/2021 10:32 AM

## 2021-02-02 DIAGNOSIS — N1832 Chronic kidney disease, stage 3b: Secondary | ICD-10-CM | POA: Diagnosis not present

## 2021-02-02 DIAGNOSIS — S82851D Displaced trimalleolar fracture of right lower leg, subsequent encounter for closed fracture with routine healing: Secondary | ICD-10-CM | POA: Diagnosis not present

## 2021-02-02 DIAGNOSIS — F32A Depression, unspecified: Secondary | ICD-10-CM | POA: Diagnosis not present

## 2021-02-02 DIAGNOSIS — G20 Parkinson's disease: Secondary | ICD-10-CM | POA: Diagnosis not present

## 2021-02-02 DIAGNOSIS — F419 Anxiety disorder, unspecified: Secondary | ICD-10-CM | POA: Diagnosis not present

## 2021-02-02 DIAGNOSIS — I129 Hypertensive chronic kidney disease with stage 1 through stage 4 chronic kidney disease, or unspecified chronic kidney disease: Secondary | ICD-10-CM | POA: Diagnosis not present

## 2021-02-07 DIAGNOSIS — N1832 Chronic kidney disease, stage 3b: Secondary | ICD-10-CM | POA: Diagnosis not present

## 2021-02-07 DIAGNOSIS — F419 Anxiety disorder, unspecified: Secondary | ICD-10-CM | POA: Diagnosis not present

## 2021-02-07 DIAGNOSIS — S82851D Displaced trimalleolar fracture of right lower leg, subsequent encounter for closed fracture with routine healing: Secondary | ICD-10-CM | POA: Diagnosis not present

## 2021-02-07 DIAGNOSIS — I129 Hypertensive chronic kidney disease with stage 1 through stage 4 chronic kidney disease, or unspecified chronic kidney disease: Secondary | ICD-10-CM | POA: Diagnosis not present

## 2021-02-07 DIAGNOSIS — G20 Parkinson's disease: Secondary | ICD-10-CM | POA: Diagnosis not present

## 2021-02-07 DIAGNOSIS — F32A Depression, unspecified: Secondary | ICD-10-CM | POA: Diagnosis not present

## 2021-02-08 DIAGNOSIS — I129 Hypertensive chronic kidney disease with stage 1 through stage 4 chronic kidney disease, or unspecified chronic kidney disease: Secondary | ICD-10-CM | POA: Diagnosis not present

## 2021-02-08 DIAGNOSIS — S82851D Displaced trimalleolar fracture of right lower leg, subsequent encounter for closed fracture with routine healing: Secondary | ICD-10-CM | POA: Diagnosis not present

## 2021-02-08 DIAGNOSIS — N1832 Chronic kidney disease, stage 3b: Secondary | ICD-10-CM | POA: Diagnosis not present

## 2021-02-08 DIAGNOSIS — F419 Anxiety disorder, unspecified: Secondary | ICD-10-CM | POA: Diagnosis not present

## 2021-02-08 DIAGNOSIS — F32A Depression, unspecified: Secondary | ICD-10-CM | POA: Diagnosis not present

## 2021-02-08 DIAGNOSIS — G20 Parkinson's disease: Secondary | ICD-10-CM | POA: Diagnosis not present

## 2021-02-10 DIAGNOSIS — F32A Depression, unspecified: Secondary | ICD-10-CM | POA: Diagnosis not present

## 2021-02-10 DIAGNOSIS — N1832 Chronic kidney disease, stage 3b: Secondary | ICD-10-CM | POA: Diagnosis not present

## 2021-02-10 DIAGNOSIS — F419 Anxiety disorder, unspecified: Secondary | ICD-10-CM | POA: Diagnosis not present

## 2021-02-10 DIAGNOSIS — G20 Parkinson's disease: Secondary | ICD-10-CM | POA: Diagnosis not present

## 2021-02-10 DIAGNOSIS — I129 Hypertensive chronic kidney disease with stage 1 through stage 4 chronic kidney disease, or unspecified chronic kidney disease: Secondary | ICD-10-CM | POA: Diagnosis not present

## 2021-02-10 DIAGNOSIS — S82851D Displaced trimalleolar fracture of right lower leg, subsequent encounter for closed fracture with routine healing: Secondary | ICD-10-CM | POA: Diagnosis not present

## 2021-02-13 DIAGNOSIS — F32A Depression, unspecified: Secondary | ICD-10-CM | POA: Diagnosis not present

## 2021-02-13 DIAGNOSIS — N1832 Chronic kidney disease, stage 3b: Secondary | ICD-10-CM | POA: Diagnosis not present

## 2021-02-13 DIAGNOSIS — G20 Parkinson's disease: Secondary | ICD-10-CM | POA: Diagnosis not present

## 2021-02-13 DIAGNOSIS — F419 Anxiety disorder, unspecified: Secondary | ICD-10-CM | POA: Diagnosis not present

## 2021-02-13 DIAGNOSIS — S82851D Displaced trimalleolar fracture of right lower leg, subsequent encounter for closed fracture with routine healing: Secondary | ICD-10-CM | POA: Diagnosis not present

## 2021-02-13 DIAGNOSIS — I129 Hypertensive chronic kidney disease with stage 1 through stage 4 chronic kidney disease, or unspecified chronic kidney disease: Secondary | ICD-10-CM | POA: Diagnosis not present

## 2021-02-15 DIAGNOSIS — F32A Depression, unspecified: Secondary | ICD-10-CM | POA: Diagnosis not present

## 2021-02-15 DIAGNOSIS — G20 Parkinson's disease: Secondary | ICD-10-CM | POA: Diagnosis not present

## 2021-02-15 DIAGNOSIS — F419 Anxiety disorder, unspecified: Secondary | ICD-10-CM | POA: Diagnosis not present

## 2021-02-15 DIAGNOSIS — S82851D Displaced trimalleolar fracture of right lower leg, subsequent encounter for closed fracture with routine healing: Secondary | ICD-10-CM | POA: Diagnosis not present

## 2021-02-15 DIAGNOSIS — I129 Hypertensive chronic kidney disease with stage 1 through stage 4 chronic kidney disease, or unspecified chronic kidney disease: Secondary | ICD-10-CM | POA: Diagnosis not present

## 2021-02-15 DIAGNOSIS — N1832 Chronic kidney disease, stage 3b: Secondary | ICD-10-CM | POA: Diagnosis not present

## 2021-02-19 DIAGNOSIS — F419 Anxiety disorder, unspecified: Secondary | ICD-10-CM | POA: Diagnosis not present

## 2021-02-19 DIAGNOSIS — I129 Hypertensive chronic kidney disease with stage 1 through stage 4 chronic kidney disease, or unspecified chronic kidney disease: Secondary | ICD-10-CM | POA: Diagnosis not present

## 2021-02-19 DIAGNOSIS — G20 Parkinson's disease: Secondary | ICD-10-CM | POA: Diagnosis not present

## 2021-02-19 DIAGNOSIS — N1832 Chronic kidney disease, stage 3b: Secondary | ICD-10-CM | POA: Diagnosis not present

## 2021-02-19 DIAGNOSIS — S82851D Displaced trimalleolar fracture of right lower leg, subsequent encounter for closed fracture with routine healing: Secondary | ICD-10-CM | POA: Diagnosis not present

## 2021-02-19 DIAGNOSIS — F32A Depression, unspecified: Secondary | ICD-10-CM | POA: Diagnosis not present

## 2021-02-20 DIAGNOSIS — G20 Parkinson's disease: Secondary | ICD-10-CM | POA: Diagnosis not present

## 2021-02-20 DIAGNOSIS — K529 Noninfective gastroenteritis and colitis, unspecified: Secondary | ICD-10-CM | POA: Diagnosis not present

## 2021-02-20 DIAGNOSIS — E669 Obesity, unspecified: Secondary | ICD-10-CM | POA: Diagnosis not present

## 2021-02-20 DIAGNOSIS — F32A Depression, unspecified: Secondary | ICD-10-CM | POA: Diagnosis not present

## 2021-02-20 DIAGNOSIS — S82851D Displaced trimalleolar fracture of right lower leg, subsequent encounter for closed fracture with routine healing: Secondary | ICD-10-CM | POA: Diagnosis not present

## 2021-02-20 DIAGNOSIS — R339 Retention of urine, unspecified: Secondary | ICD-10-CM | POA: Diagnosis not present

## 2021-02-20 DIAGNOSIS — E538 Deficiency of other specified B group vitamins: Secondary | ICD-10-CM | POA: Diagnosis not present

## 2021-02-20 DIAGNOSIS — Z6836 Body mass index (BMI) 36.0-36.9, adult: Secondary | ICD-10-CM | POA: Diagnosis not present

## 2021-02-20 DIAGNOSIS — F419 Anxiety disorder, unspecified: Secondary | ICD-10-CM | POA: Diagnosis not present

## 2021-02-20 DIAGNOSIS — H3530 Unspecified macular degeneration: Secondary | ICD-10-CM | POA: Diagnosis not present

## 2021-02-20 DIAGNOSIS — Z9181 History of falling: Secondary | ICD-10-CM | POA: Diagnosis not present

## 2021-02-20 DIAGNOSIS — I129 Hypertensive chronic kidney disease with stage 1 through stage 4 chronic kidney disease, or unspecified chronic kidney disease: Secondary | ICD-10-CM | POA: Diagnosis not present

## 2021-02-20 DIAGNOSIS — N1832 Chronic kidney disease, stage 3b: Secondary | ICD-10-CM | POA: Diagnosis not present

## 2021-02-20 DIAGNOSIS — Z7982 Long term (current) use of aspirin: Secondary | ICD-10-CM | POA: Diagnosis not present

## 2021-02-21 DIAGNOSIS — F419 Anxiety disorder, unspecified: Secondary | ICD-10-CM | POA: Diagnosis not present

## 2021-02-21 DIAGNOSIS — I129 Hypertensive chronic kidney disease with stage 1 through stage 4 chronic kidney disease, or unspecified chronic kidney disease: Secondary | ICD-10-CM | POA: Diagnosis not present

## 2021-02-21 DIAGNOSIS — G20 Parkinson's disease: Secondary | ICD-10-CM | POA: Diagnosis not present

## 2021-02-21 DIAGNOSIS — S82851D Displaced trimalleolar fracture of right lower leg, subsequent encounter for closed fracture with routine healing: Secondary | ICD-10-CM | POA: Diagnosis not present

## 2021-02-21 DIAGNOSIS — F32A Depression, unspecified: Secondary | ICD-10-CM | POA: Diagnosis not present

## 2021-02-21 DIAGNOSIS — N1832 Chronic kidney disease, stage 3b: Secondary | ICD-10-CM | POA: Diagnosis not present

## 2021-02-22 DIAGNOSIS — K5900 Constipation, unspecified: Secondary | ICD-10-CM | POA: Diagnosis not present

## 2021-02-22 DIAGNOSIS — R4182 Altered mental status, unspecified: Secondary | ICD-10-CM | POA: Diagnosis not present

## 2021-02-22 DIAGNOSIS — R4189 Other symptoms and signs involving cognitive functions and awareness: Secondary | ICD-10-CM | POA: Diagnosis not present

## 2021-02-22 DIAGNOSIS — Z9181 History of falling: Secondary | ICD-10-CM | POA: Diagnosis not present

## 2021-02-22 DIAGNOSIS — Z8781 Personal history of (healed) traumatic fracture: Secondary | ICD-10-CM | POA: Diagnosis not present

## 2021-02-22 DIAGNOSIS — Z993 Dependence on wheelchair: Secondary | ICD-10-CM | POA: Diagnosis not present

## 2021-02-22 DIAGNOSIS — G20 Parkinson's disease: Secondary | ICD-10-CM | POA: Diagnosis not present

## 2021-02-23 ENCOUNTER — Ambulatory Visit (INDEPENDENT_AMBULATORY_CARE_PROVIDER_SITE_OTHER): Payer: Medicare Other | Admitting: Orthopedic Surgery

## 2021-02-23 ENCOUNTER — Other Ambulatory Visit: Payer: Self-pay | Admitting: Psychiatry

## 2021-02-23 ENCOUNTER — Telehealth: Payer: Self-pay | Admitting: Orthopedic Surgery

## 2021-02-23 ENCOUNTER — Other Ambulatory Visit: Payer: Self-pay

## 2021-02-23 ENCOUNTER — Other Ambulatory Visit (HOSPITAL_COMMUNITY): Payer: Self-pay | Admitting: Psychiatry

## 2021-02-23 ENCOUNTER — Encounter: Payer: Self-pay | Admitting: Orthopedic Surgery

## 2021-02-23 VITALS — Ht 62.0 in | Wt 204.0 lb

## 2021-02-23 DIAGNOSIS — T8141XD Infection following a procedure, superficial incisional surgical site, subsequent encounter: Secondary | ICD-10-CM

## 2021-02-23 DIAGNOSIS — R4182 Altered mental status, unspecified: Secondary | ICD-10-CM

## 2021-02-23 DIAGNOSIS — S82201S Unspecified fracture of shaft of right tibia, sequela: Secondary | ICD-10-CM

## 2021-02-23 DIAGNOSIS — S82401S Unspecified fracture of shaft of right fibula, sequela: Secondary | ICD-10-CM

## 2021-02-23 MED ORDER — CEPHALEXIN 250 MG PO CAPS: 250.0000 mg | ORAL_CAPSULE | Freq: Four times a day (QID) | ORAL | 0 refills | Status: AC

## 2021-02-23 NOTE — Telephone Encounter (Signed)
Call received from patient's designated contact on file, husband Jenny Reichmann, relaying that he just noticed that patient's wound is draining again - said "just running:"  Aware Dr Amedeo Kinsman is in surgery today. Discussed appointment tomorrow; nurse to advise.

## 2021-02-23 NOTE — Progress Notes (Signed)
Orthopaedic Postop Note  Assessment: Tonya Perez is a 75 y.o. female s/p ORIF of Right ankle fracture (32/44/0102), complicated by wound drainage and breakdown requiring I&D, removal of screw and revision closure (01/19/2021)  Plan: Postoperative infection.  Pain is worsened.  Drainage has resumed.  Will start keflex again and plan to see the patient in a week.  As long as she is not ill, she can remain on PO antibiotics.  Do not need IV antibiotics at this time.   Follow-up: No follow-ups on file.   XR at next visit: XR R ankle  Subjective:  No chief complaint on file.   History of Present Illness: Tonya Perez is a 75 y.o. female who presents following the above stated procedure.  She was doing well until yesterday when she started to have more pain in the ankle.  Drainage started yesterday and has worsened over night.   No fevers or chills.   She had an appointment yesterday with her Neurologist, and she ordered a CT scan based on her presentation.     Review of Systems: No fevers or chills No numbness or tingling No Chest Pain No shortness of breath   Objective: There were no vitals taken for this visit.  Physical Exam:  Right ankle with minimal swelling. Small area of drainage.  Clear drainage.  Tender at the drainage site.  Foot is red otherwise.  No areas of fluctuance.  IMAGING: I personally ordered and reviewed the following images  No new imaging obtained today   [name], MD 02/23/2021 11:02 AM

## 2021-02-23 NOTE — Telephone Encounter (Signed)
Called husband of pt who states pt was c/o pain in her foot yesterday and he noticed redness. Today husband states that puss/clear fluid is just running off her heel. Spoke with Dr. Amedeo Kinsman who would like to have the patient come in as soon as possible. Husband verbalized understanding and is heading in now.

## 2021-02-27 ENCOUNTER — Encounter (HOSPITAL_COMMUNITY): Payer: Self-pay

## 2021-02-27 ENCOUNTER — Ambulatory Visit (HOSPITAL_COMMUNITY): Admission: RE | Admit: 2021-02-27 | Payer: Medicare Other | Source: Ambulatory Visit

## 2021-02-28 DIAGNOSIS — N1832 Chronic kidney disease, stage 3b: Secondary | ICD-10-CM | POA: Diagnosis not present

## 2021-02-28 DIAGNOSIS — F419 Anxiety disorder, unspecified: Secondary | ICD-10-CM | POA: Diagnosis not present

## 2021-02-28 DIAGNOSIS — G20 Parkinson's disease: Secondary | ICD-10-CM | POA: Diagnosis not present

## 2021-02-28 DIAGNOSIS — F32A Depression, unspecified: Secondary | ICD-10-CM | POA: Diagnosis not present

## 2021-02-28 DIAGNOSIS — S82851D Displaced trimalleolar fracture of right lower leg, subsequent encounter for closed fracture with routine healing: Secondary | ICD-10-CM | POA: Diagnosis not present

## 2021-02-28 DIAGNOSIS — I129 Hypertensive chronic kidney disease with stage 1 through stage 4 chronic kidney disease, or unspecified chronic kidney disease: Secondary | ICD-10-CM | POA: Diagnosis not present

## 2021-03-01 ENCOUNTER — Encounter: Payer: Self-pay | Admitting: Orthopedic Surgery

## 2021-03-01 ENCOUNTER — Ambulatory Visit (INDEPENDENT_AMBULATORY_CARE_PROVIDER_SITE_OTHER): Payer: Medicare Other | Admitting: Orthopedic Surgery

## 2021-03-01 ENCOUNTER — Other Ambulatory Visit: Payer: Self-pay

## 2021-03-01 VITALS — Ht 62.0 in | Wt 204.0 lb

## 2021-03-01 DIAGNOSIS — T8141XD Infection following a procedure, superficial incisional surgical site, subsequent encounter: Secondary | ICD-10-CM

## 2021-03-01 DIAGNOSIS — F411 Generalized anxiety disorder: Secondary | ICD-10-CM | POA: Diagnosis not present

## 2021-03-01 DIAGNOSIS — F341 Dysthymic disorder: Secondary | ICD-10-CM | POA: Diagnosis not present

## 2021-03-01 MED ORDER — CEPHALEXIN 250 MG PO CAPS: 250.0000 mg | ORAL_CAPSULE | Freq: Four times a day (QID) | ORAL | 0 refills | Status: AC

## 2021-03-01 NOTE — Patient Instructions (Signed)
Complete your current antibiotics  I will send in another prescription for you to have available if the drainage resumes.  Ok to walk on your ankle  Dressing as needed  F/u 3 months

## 2021-03-01 NOTE — Progress Notes (Signed)
Orthopaedic Postop Note  Assessment: Tonya Perez is a 75 y.o. female s/p ORIF of Right ankle fracture (08/18/2535), complicated by wound drainage and breakdown requiring I&D, removal of screw and revision closure (01/19/2021); recurrent postoperative infection  Plan: Drainage has improved.  She is tolerating the keflex.  Continue with current prescription for keflex and I have sent in another prescription for keflex, in case the drainage returns.  Can consider a different antibiotic.  Plan to keep infection minimal for now, consider hardware removal 1 year after original surgery.  Follow up in 3 months.  Call if questions or concerns.   Follow-up: No follow-ups on file.   XR at next visit: XR R ankle  Subjective:  Chief Complaint  Patient presents with  . Routine Post Op    Rt tib/fib fx I&D DOS 01/19/21    History of Present Illness: Tonya Perez is a 75 y.o. female who presents following the above stated procedure.  Since clinic visit last week, the drainage has improved.  Her pain is a lot better.  She is working with PT and able to ambulate.  No fevers or chills.  She is tolerating the antibiotics without difficulty.   Review of Systems: No fevers or chills No numbness or tingling No Chest Pain No shortness of breath   Objective: Ht 5\' 2"  (1.575 m)   Wt 204 lb (92.5 kg)   BMI 37.31 kg/m   Physical Exam:  Right lateral ankle with a small amount of drainage.  Small open area of the wound.  Minimal tenderness along the lateral ankle.  No fluctuance.  Active motion of the ankle intact.  Toes are warm and well perfused.   IMAGING: I personally ordered and reviewed the following images  No new imaging obtained today   [name], MD 03/01/2021 10:33 AM

## 2021-03-02 DIAGNOSIS — F32A Depression, unspecified: Secondary | ICD-10-CM | POA: Diagnosis not present

## 2021-03-02 DIAGNOSIS — N1832 Chronic kidney disease, stage 3b: Secondary | ICD-10-CM | POA: Diagnosis not present

## 2021-03-02 DIAGNOSIS — S82851D Displaced trimalleolar fracture of right lower leg, subsequent encounter for closed fracture with routine healing: Secondary | ICD-10-CM | POA: Diagnosis not present

## 2021-03-02 DIAGNOSIS — I129 Hypertensive chronic kidney disease with stage 1 through stage 4 chronic kidney disease, or unspecified chronic kidney disease: Secondary | ICD-10-CM | POA: Diagnosis not present

## 2021-03-02 DIAGNOSIS — G20 Parkinson's disease: Secondary | ICD-10-CM | POA: Diagnosis not present

## 2021-03-02 DIAGNOSIS — F419 Anxiety disorder, unspecified: Secondary | ICD-10-CM | POA: Diagnosis not present

## 2021-03-08 DIAGNOSIS — I129 Hypertensive chronic kidney disease with stage 1 through stage 4 chronic kidney disease, or unspecified chronic kidney disease: Secondary | ICD-10-CM | POA: Diagnosis not present

## 2021-03-08 DIAGNOSIS — S82851D Displaced trimalleolar fracture of right lower leg, subsequent encounter for closed fracture with routine healing: Secondary | ICD-10-CM | POA: Diagnosis not present

## 2021-03-08 DIAGNOSIS — F32A Depression, unspecified: Secondary | ICD-10-CM | POA: Diagnosis not present

## 2021-03-08 DIAGNOSIS — G20 Parkinson's disease: Secondary | ICD-10-CM | POA: Diagnosis not present

## 2021-03-08 DIAGNOSIS — N1832 Chronic kidney disease, stage 3b: Secondary | ICD-10-CM | POA: Diagnosis not present

## 2021-03-08 DIAGNOSIS — F419 Anxiety disorder, unspecified: Secondary | ICD-10-CM | POA: Diagnosis not present

## 2021-03-15 DIAGNOSIS — I129 Hypertensive chronic kidney disease with stage 1 through stage 4 chronic kidney disease, or unspecified chronic kidney disease: Secondary | ICD-10-CM | POA: Diagnosis not present

## 2021-03-15 DIAGNOSIS — N1832 Chronic kidney disease, stage 3b: Secondary | ICD-10-CM | POA: Diagnosis not present

## 2021-03-15 DIAGNOSIS — G20 Parkinson's disease: Secondary | ICD-10-CM | POA: Diagnosis not present

## 2021-03-15 DIAGNOSIS — F32A Depression, unspecified: Secondary | ICD-10-CM | POA: Diagnosis not present

## 2021-03-15 DIAGNOSIS — F419 Anxiety disorder, unspecified: Secondary | ICD-10-CM | POA: Diagnosis not present

## 2021-03-15 DIAGNOSIS — S82851D Displaced trimalleolar fracture of right lower leg, subsequent encounter for closed fracture with routine healing: Secondary | ICD-10-CM | POA: Diagnosis not present

## 2021-03-21 DIAGNOSIS — S82851D Displaced trimalleolar fracture of right lower leg, subsequent encounter for closed fracture with routine healing: Secondary | ICD-10-CM | POA: Diagnosis not present

## 2021-03-21 DIAGNOSIS — N1832 Chronic kidney disease, stage 3b: Secondary | ICD-10-CM | POA: Diagnosis not present

## 2021-03-21 DIAGNOSIS — F32A Depression, unspecified: Secondary | ICD-10-CM | POA: Diagnosis not present

## 2021-03-21 DIAGNOSIS — G20 Parkinson's disease: Secondary | ICD-10-CM | POA: Diagnosis not present

## 2021-03-21 DIAGNOSIS — F419 Anxiety disorder, unspecified: Secondary | ICD-10-CM | POA: Diagnosis not present

## 2021-03-21 DIAGNOSIS — I129 Hypertensive chronic kidney disease with stage 1 through stage 4 chronic kidney disease, or unspecified chronic kidney disease: Secondary | ICD-10-CM | POA: Diagnosis not present

## 2021-03-22 DIAGNOSIS — Z9181 History of falling: Secondary | ICD-10-CM | POA: Diagnosis not present

## 2021-03-22 DIAGNOSIS — E538 Deficiency of other specified B group vitamins: Secondary | ICD-10-CM | POA: Diagnosis not present

## 2021-03-22 DIAGNOSIS — K529 Noninfective gastroenteritis and colitis, unspecified: Secondary | ICD-10-CM | POA: Diagnosis not present

## 2021-03-22 DIAGNOSIS — Z6836 Body mass index (BMI) 36.0-36.9, adult: Secondary | ICD-10-CM | POA: Diagnosis not present

## 2021-03-22 DIAGNOSIS — E669 Obesity, unspecified: Secondary | ICD-10-CM | POA: Diagnosis not present

## 2021-03-22 DIAGNOSIS — G20 Parkinson's disease: Secondary | ICD-10-CM | POA: Diagnosis not present

## 2021-03-22 DIAGNOSIS — Z7982 Long term (current) use of aspirin: Secondary | ICD-10-CM | POA: Diagnosis not present

## 2021-03-22 DIAGNOSIS — I129 Hypertensive chronic kidney disease with stage 1 through stage 4 chronic kidney disease, or unspecified chronic kidney disease: Secondary | ICD-10-CM | POA: Diagnosis not present

## 2021-03-22 DIAGNOSIS — N1832 Chronic kidney disease, stage 3b: Secondary | ICD-10-CM | POA: Diagnosis not present

## 2021-03-22 DIAGNOSIS — R339 Retention of urine, unspecified: Secondary | ICD-10-CM | POA: Diagnosis not present

## 2021-03-22 DIAGNOSIS — F32A Depression, unspecified: Secondary | ICD-10-CM | POA: Diagnosis not present

## 2021-03-22 DIAGNOSIS — F419 Anxiety disorder, unspecified: Secondary | ICD-10-CM | POA: Diagnosis not present

## 2021-03-22 DIAGNOSIS — S82851D Displaced trimalleolar fracture of right lower leg, subsequent encounter for closed fracture with routine healing: Secondary | ICD-10-CM | POA: Diagnosis not present

## 2021-03-22 DIAGNOSIS — H3530 Unspecified macular degeneration: Secondary | ICD-10-CM | POA: Diagnosis not present

## 2021-03-28 DIAGNOSIS — F32A Depression, unspecified: Secondary | ICD-10-CM | POA: Diagnosis not present

## 2021-03-28 DIAGNOSIS — I129 Hypertensive chronic kidney disease with stage 1 through stage 4 chronic kidney disease, or unspecified chronic kidney disease: Secondary | ICD-10-CM | POA: Diagnosis not present

## 2021-03-28 DIAGNOSIS — S82851D Displaced trimalleolar fracture of right lower leg, subsequent encounter for closed fracture with routine healing: Secondary | ICD-10-CM | POA: Diagnosis not present

## 2021-03-28 DIAGNOSIS — F419 Anxiety disorder, unspecified: Secondary | ICD-10-CM | POA: Diagnosis not present

## 2021-03-28 DIAGNOSIS — N1832 Chronic kidney disease, stage 3b: Secondary | ICD-10-CM | POA: Diagnosis not present

## 2021-03-28 DIAGNOSIS — G20 Parkinson's disease: Secondary | ICD-10-CM | POA: Diagnosis not present

## 2021-03-29 ENCOUNTER — Ambulatory Visit (HOSPITAL_COMMUNITY)
Admission: RE | Admit: 2021-03-29 | Discharge: 2021-03-29 | Disposition: A | Discharge status: Home / Self Care | Payer: Medicare Other | Source: Ambulatory Visit | Attending: Psychiatry | Admitting: Psychiatry

## 2021-03-29 DIAGNOSIS — R4182 Altered mental status, unspecified: Secondary | ICD-10-CM | POA: Diagnosis not present

## 2021-03-29 DIAGNOSIS — G319 Degenerative disease of nervous system, unspecified: Secondary | ICD-10-CM | POA: Diagnosis not present

## 2021-04-04 DIAGNOSIS — F419 Anxiety disorder, unspecified: Secondary | ICD-10-CM | POA: Diagnosis not present

## 2021-04-04 DIAGNOSIS — I129 Hypertensive chronic kidney disease with stage 1 through stage 4 chronic kidney disease, or unspecified chronic kidney disease: Secondary | ICD-10-CM | POA: Diagnosis not present

## 2021-04-04 DIAGNOSIS — F32A Depression, unspecified: Secondary | ICD-10-CM | POA: Diagnosis not present

## 2021-04-04 DIAGNOSIS — N1832 Chronic kidney disease, stage 3b: Secondary | ICD-10-CM | POA: Diagnosis not present

## 2021-04-04 DIAGNOSIS — G20 Parkinson's disease: Secondary | ICD-10-CM | POA: Diagnosis not present

## 2021-04-04 DIAGNOSIS — S82851D Displaced trimalleolar fracture of right lower leg, subsequent encounter for closed fracture with routine healing: Secondary | ICD-10-CM | POA: Diagnosis not present

## 2021-04-11 DIAGNOSIS — I129 Hypertensive chronic kidney disease with stage 1 through stage 4 chronic kidney disease, or unspecified chronic kidney disease: Secondary | ICD-10-CM | POA: Diagnosis not present

## 2021-04-11 DIAGNOSIS — F419 Anxiety disorder, unspecified: Secondary | ICD-10-CM | POA: Diagnosis not present

## 2021-04-11 DIAGNOSIS — S82851D Displaced trimalleolar fracture of right lower leg, subsequent encounter for closed fracture with routine healing: Secondary | ICD-10-CM | POA: Diagnosis not present

## 2021-04-11 DIAGNOSIS — N1832 Chronic kidney disease, stage 3b: Secondary | ICD-10-CM | POA: Diagnosis not present

## 2021-04-11 DIAGNOSIS — G20 Parkinson's disease: Secondary | ICD-10-CM | POA: Diagnosis not present

## 2021-04-11 DIAGNOSIS — F32A Depression, unspecified: Secondary | ICD-10-CM | POA: Diagnosis not present

## 2021-04-18 DIAGNOSIS — G20 Parkinson's disease: Secondary | ICD-10-CM | POA: Diagnosis not present

## 2021-04-18 DIAGNOSIS — F32A Depression, unspecified: Secondary | ICD-10-CM | POA: Diagnosis not present

## 2021-04-18 DIAGNOSIS — F419 Anxiety disorder, unspecified: Secondary | ICD-10-CM | POA: Diagnosis not present

## 2021-04-18 DIAGNOSIS — S82851D Displaced trimalleolar fracture of right lower leg, subsequent encounter for closed fracture with routine healing: Secondary | ICD-10-CM | POA: Diagnosis not present

## 2021-04-18 DIAGNOSIS — N1832 Chronic kidney disease, stage 3b: Secondary | ICD-10-CM | POA: Diagnosis not present

## 2021-04-18 DIAGNOSIS — I129 Hypertensive chronic kidney disease with stage 1 through stage 4 chronic kidney disease, or unspecified chronic kidney disease: Secondary | ICD-10-CM | POA: Diagnosis not present

## 2021-04-21 DIAGNOSIS — Z7982 Long term (current) use of aspirin: Secondary | ICD-10-CM | POA: Diagnosis not present

## 2021-04-21 DIAGNOSIS — F32A Depression, unspecified: Secondary | ICD-10-CM | POA: Diagnosis not present

## 2021-04-21 DIAGNOSIS — F419 Anxiety disorder, unspecified: Secondary | ICD-10-CM | POA: Diagnosis not present

## 2021-04-21 DIAGNOSIS — I129 Hypertensive chronic kidney disease with stage 1 through stage 4 chronic kidney disease, or unspecified chronic kidney disease: Secondary | ICD-10-CM | POA: Diagnosis not present

## 2021-04-21 DIAGNOSIS — N1832 Chronic kidney disease, stage 3b: Secondary | ICD-10-CM | POA: Diagnosis not present

## 2021-04-21 DIAGNOSIS — K529 Noninfective gastroenteritis and colitis, unspecified: Secondary | ICD-10-CM | POA: Diagnosis not present

## 2021-04-21 DIAGNOSIS — E669 Obesity, unspecified: Secondary | ICD-10-CM | POA: Diagnosis not present

## 2021-04-21 DIAGNOSIS — S82851D Displaced trimalleolar fracture of right lower leg, subsequent encounter for closed fracture with routine healing: Secondary | ICD-10-CM | POA: Diagnosis not present

## 2021-04-21 DIAGNOSIS — Z9181 History of falling: Secondary | ICD-10-CM | POA: Diagnosis not present

## 2021-04-21 DIAGNOSIS — H3530 Unspecified macular degeneration: Secondary | ICD-10-CM | POA: Diagnosis not present

## 2021-04-21 DIAGNOSIS — Z6836 Body mass index (BMI) 36.0-36.9, adult: Secondary | ICD-10-CM | POA: Diagnosis not present

## 2021-04-21 DIAGNOSIS — G20 Parkinson's disease: Secondary | ICD-10-CM | POA: Diagnosis not present

## 2021-04-21 DIAGNOSIS — R339 Retention of urine, unspecified: Secondary | ICD-10-CM | POA: Diagnosis not present

## 2021-04-21 DIAGNOSIS — E538 Deficiency of other specified B group vitamins: Secondary | ICD-10-CM | POA: Diagnosis not present

## 2021-04-25 DIAGNOSIS — S82851D Displaced trimalleolar fracture of right lower leg, subsequent encounter for closed fracture with routine healing: Secondary | ICD-10-CM | POA: Diagnosis not present

## 2021-04-25 DIAGNOSIS — N1832 Chronic kidney disease, stage 3b: Secondary | ICD-10-CM | POA: Diagnosis not present

## 2021-04-25 DIAGNOSIS — G20 Parkinson's disease: Secondary | ICD-10-CM | POA: Diagnosis not present

## 2021-04-25 DIAGNOSIS — F32A Depression, unspecified: Secondary | ICD-10-CM | POA: Diagnosis not present

## 2021-04-25 DIAGNOSIS — F419 Anxiety disorder, unspecified: Secondary | ICD-10-CM | POA: Diagnosis not present

## 2021-04-25 DIAGNOSIS — I129 Hypertensive chronic kidney disease with stage 1 through stage 4 chronic kidney disease, or unspecified chronic kidney disease: Secondary | ICD-10-CM | POA: Diagnosis not present

## 2021-04-27 DIAGNOSIS — N1832 Chronic kidney disease, stage 3b: Secondary | ICD-10-CM | POA: Diagnosis not present

## 2021-04-27 DIAGNOSIS — F419 Anxiety disorder, unspecified: Secondary | ICD-10-CM | POA: Diagnosis not present

## 2021-04-27 DIAGNOSIS — S82851D Displaced trimalleolar fracture of right lower leg, subsequent encounter for closed fracture with routine healing: Secondary | ICD-10-CM | POA: Diagnosis not present

## 2021-04-27 DIAGNOSIS — G20 Parkinson's disease: Secondary | ICD-10-CM | POA: Diagnosis not present

## 2021-04-27 DIAGNOSIS — F32A Depression, unspecified: Secondary | ICD-10-CM | POA: Diagnosis not present

## 2021-04-27 DIAGNOSIS — I129 Hypertensive chronic kidney disease with stage 1 through stage 4 chronic kidney disease, or unspecified chronic kidney disease: Secondary | ICD-10-CM | POA: Diagnosis not present

## 2021-05-01 DIAGNOSIS — F411 Generalized anxiety disorder: Secondary | ICD-10-CM | POA: Diagnosis not present

## 2021-05-01 DIAGNOSIS — F341 Dysthymic disorder: Secondary | ICD-10-CM | POA: Diagnosis not present

## 2021-05-02 DIAGNOSIS — F32A Depression, unspecified: Secondary | ICD-10-CM | POA: Diagnosis not present

## 2021-05-02 DIAGNOSIS — N1832 Chronic kidney disease, stage 3b: Secondary | ICD-10-CM | POA: Diagnosis not present

## 2021-05-02 DIAGNOSIS — G20 Parkinson's disease: Secondary | ICD-10-CM | POA: Diagnosis not present

## 2021-05-02 DIAGNOSIS — I129 Hypertensive chronic kidney disease with stage 1 through stage 4 chronic kidney disease, or unspecified chronic kidney disease: Secondary | ICD-10-CM | POA: Diagnosis not present

## 2021-05-02 DIAGNOSIS — F419 Anxiety disorder, unspecified: Secondary | ICD-10-CM | POA: Diagnosis not present

## 2021-05-02 DIAGNOSIS — S82851D Displaced trimalleolar fracture of right lower leg, subsequent encounter for closed fracture with routine healing: Secondary | ICD-10-CM | POA: Diagnosis not present

## 2021-05-04 ENCOUNTER — Telehealth: Payer: Self-pay | Admitting: Orthopedic Surgery

## 2021-05-04 DIAGNOSIS — F419 Anxiety disorder, unspecified: Secondary | ICD-10-CM | POA: Diagnosis not present

## 2021-05-04 DIAGNOSIS — F32A Depression, unspecified: Secondary | ICD-10-CM | POA: Diagnosis not present

## 2021-05-04 DIAGNOSIS — G20 Parkinson's disease: Secondary | ICD-10-CM | POA: Diagnosis not present

## 2021-05-04 DIAGNOSIS — N1832 Chronic kidney disease, stage 3b: Secondary | ICD-10-CM | POA: Diagnosis not present

## 2021-05-04 DIAGNOSIS — S82851D Displaced trimalleolar fracture of right lower leg, subsequent encounter for closed fracture with routine healing: Secondary | ICD-10-CM | POA: Diagnosis not present

## 2021-05-04 DIAGNOSIS — I129 Hypertensive chronic kidney disease with stage 1 through stage 4 chronic kidney disease, or unspecified chronic kidney disease: Secondary | ICD-10-CM | POA: Diagnosis not present

## 2021-05-04 NOTE — Telephone Encounter (Signed)
Spoke with pt husband who states pt is not having anymore drainage and the wound has a scab. States pt physical therapist suggested he call because pt isn't doing as well with her PT. Let husband know that Dr. Amedeo Kinsman suggested contacting neurologist and moving pt appointment up to the week of the 26th. Husband agrees to appointment with Dr. Amedeo Kinsman week of the 26th.

## 2021-05-04 NOTE — Telephone Encounter (Signed)
Mr. Rainone called and is concerned about Jossie's mental state.  He wants to speak to Dr. Amedeo Kinsman to see if he thinks the antibiotic  could cause her not to think straight or if it is Parkinson's.    Could someone please call him regarding this?  Thanks

## 2021-05-08 DIAGNOSIS — N1832 Chronic kidney disease, stage 3b: Secondary | ICD-10-CM | POA: Diagnosis not present

## 2021-05-08 DIAGNOSIS — G20 Parkinson's disease: Secondary | ICD-10-CM | POA: Diagnosis not present

## 2021-05-08 DIAGNOSIS — I129 Hypertensive chronic kidney disease with stage 1 through stage 4 chronic kidney disease, or unspecified chronic kidney disease: Secondary | ICD-10-CM | POA: Diagnosis not present

## 2021-05-08 DIAGNOSIS — F419 Anxiety disorder, unspecified: Secondary | ICD-10-CM | POA: Diagnosis not present

## 2021-05-08 DIAGNOSIS — F32A Depression, unspecified: Secondary | ICD-10-CM | POA: Diagnosis not present

## 2021-05-08 DIAGNOSIS — S82851D Displaced trimalleolar fracture of right lower leg, subsequent encounter for closed fracture with routine healing: Secondary | ICD-10-CM | POA: Diagnosis not present

## 2021-05-10 DIAGNOSIS — F32A Depression, unspecified: Secondary | ICD-10-CM | POA: Diagnosis not present

## 2021-05-10 DIAGNOSIS — I129 Hypertensive chronic kidney disease with stage 1 through stage 4 chronic kidney disease, or unspecified chronic kidney disease: Secondary | ICD-10-CM | POA: Diagnosis not present

## 2021-05-10 DIAGNOSIS — S82851D Displaced trimalleolar fracture of right lower leg, subsequent encounter for closed fracture with routine healing: Secondary | ICD-10-CM | POA: Diagnosis not present

## 2021-05-10 DIAGNOSIS — G20 Parkinson's disease: Secondary | ICD-10-CM | POA: Diagnosis not present

## 2021-05-10 DIAGNOSIS — F419 Anxiety disorder, unspecified: Secondary | ICD-10-CM | POA: Diagnosis not present

## 2021-05-10 DIAGNOSIS — N1832 Chronic kidney disease, stage 3b: Secondary | ICD-10-CM | POA: Diagnosis not present

## 2021-05-15 DIAGNOSIS — G20 Parkinson's disease: Secondary | ICD-10-CM | POA: Diagnosis not present

## 2021-05-15 DIAGNOSIS — S82851D Displaced trimalleolar fracture of right lower leg, subsequent encounter for closed fracture with routine healing: Secondary | ICD-10-CM | POA: Diagnosis not present

## 2021-05-15 DIAGNOSIS — I129 Hypertensive chronic kidney disease with stage 1 through stage 4 chronic kidney disease, or unspecified chronic kidney disease: Secondary | ICD-10-CM | POA: Diagnosis not present

## 2021-05-15 DIAGNOSIS — F419 Anxiety disorder, unspecified: Secondary | ICD-10-CM | POA: Diagnosis not present

## 2021-05-15 DIAGNOSIS — N1832 Chronic kidney disease, stage 3b: Secondary | ICD-10-CM | POA: Diagnosis not present

## 2021-05-15 DIAGNOSIS — F32A Depression, unspecified: Secondary | ICD-10-CM | POA: Diagnosis not present

## 2021-05-16 ENCOUNTER — Encounter: Payer: Self-pay | Admitting: Orthopedic Surgery

## 2021-05-16 ENCOUNTER — Ambulatory Visit (INDEPENDENT_AMBULATORY_CARE_PROVIDER_SITE_OTHER): Payer: Medicare Other | Admitting: Orthopedic Surgery

## 2021-05-16 ENCOUNTER — Other Ambulatory Visit: Payer: Self-pay

## 2021-05-16 VITALS — BP 124/64 | HR 70 | Ht 62.0 in | Wt 204.0 lb

## 2021-05-16 DIAGNOSIS — T8141XD Infection following a procedure, superficial incisional surgical site, subsequent encounter: Secondary | ICD-10-CM | POA: Diagnosis not present

## 2021-05-16 NOTE — Progress Notes (Signed)
Orthopaedic Postop Note  Assessment: Ellieana Waiters is a 75 y.o. female s/p ORIF of Right ankle fracture (123XX123), complicated by wound drainage and breakdown requiring I&D, removal of screw and revision closure (01/19/2021); recurrent postoperative infection  Plan: Currently, the patient is not having any drainage.  She does have some persistent swelling, mild tenderness over the lateral ankle.  At this point, it is obvious that she has a persistent, indolent infection.  She will benefit from hardware removal for definitive treatment of the infection, but is too early to consider this.  She can continue with activities as tolerated.  I stressed to the patient's husband that I cannot be certain that the infection and/or associated antibiotics are contributing to her recent altered mental state, but it is possible.  It is also possible that her injury, followed by subsequent surgeries is exacerbated her Parkinson's presentation.  We discussed the plan to proceed with hardware removal, but this will not take place until approximately 1 year Izabella Marcantel following her initial surgery.  He stated his understanding.  We will plan to meet back in approximately 4 weeks for repeat evaluation, this should give [place] ample time to prepare for hardware removal.  In the interim, if they notice worsening of the ankle, including return of drainage, he should initiate the antibiotics that he currently has.  If he has any issues, he should contact the clinic as he has throughout her recovery.  Follow-up: Return in about 4 months (around 09/16/2021).   XR at next visit: XR R ankle  Subjective:  Chief Complaint  Patient presents with   Routine Post Op    Husband of pt moved up appointment due to PT stating pt isn't doing as well, wants to know if some of her AMS may be coming from procedure/antibiotics I&D DOS 01/19/21.    History of Present Illness: Lashena Inclan is a 75 y.o. female who presents following the above stated  procedure.  She has a persistent infection, but the drainage is stopped.  She has a scab over the lateral ankle, which is remained stable.  She has mild tenderness to palpation.  There is been no worsening of the redness.  According to home health physical therapy, she has not been progressing as well recently, and the patient's husband was concerned as a result.  He has also noticed that her mentation has not been as clear, is concerned that this might be related to the infection and associated antibiotics.  Review of Systems: No fevers or chills No numbness or tingling No Chest Pain No shortness of breath   Objective: BP 124/64   Pulse 70   Ht '5\' 2"'$  (1.575 m)   Wt 204 lb (92.5 kg)   BMI 37.31 kg/m   Physical Exam:  Right lateral ankle with no drainage.  At the midpoint of the incision, there is a healthy appearing scab.  There are some swelling surrounding this area, without obvious erythema.  There is some mild tenderness to palpation.  She tolerates dorsiflexion of the ankle.  Toes are warm and well-perfused.  No tenderness palpation along the medial malleolus incision.  IMAGING: I personally ordered and reviewed the following images  No new imaging obtained today   [name], MD 05/16/2021 6:38 PM

## 2021-05-18 DIAGNOSIS — N1832 Chronic kidney disease, stage 3b: Secondary | ICD-10-CM | POA: Diagnosis not present

## 2021-05-18 DIAGNOSIS — G20 Parkinson's disease: Secondary | ICD-10-CM | POA: Diagnosis not present

## 2021-05-18 DIAGNOSIS — F419 Anxiety disorder, unspecified: Secondary | ICD-10-CM | POA: Diagnosis not present

## 2021-05-18 DIAGNOSIS — I129 Hypertensive chronic kidney disease with stage 1 through stage 4 chronic kidney disease, or unspecified chronic kidney disease: Secondary | ICD-10-CM | POA: Diagnosis not present

## 2021-05-18 DIAGNOSIS — F32A Depression, unspecified: Secondary | ICD-10-CM | POA: Diagnosis not present

## 2021-05-18 DIAGNOSIS — S82851D Displaced trimalleolar fracture of right lower leg, subsequent encounter for closed fracture with routine healing: Secondary | ICD-10-CM | POA: Diagnosis not present

## 2021-05-21 DIAGNOSIS — F419 Anxiety disorder, unspecified: Secondary | ICD-10-CM | POA: Diagnosis not present

## 2021-05-21 DIAGNOSIS — K529 Noninfective gastroenteritis and colitis, unspecified: Secondary | ICD-10-CM | POA: Diagnosis not present

## 2021-05-21 DIAGNOSIS — F32A Depression, unspecified: Secondary | ICD-10-CM | POA: Diagnosis not present

## 2021-05-21 DIAGNOSIS — Z7982 Long term (current) use of aspirin: Secondary | ICD-10-CM | POA: Diagnosis not present

## 2021-05-21 DIAGNOSIS — N1832 Chronic kidney disease, stage 3b: Secondary | ICD-10-CM | POA: Diagnosis not present

## 2021-05-21 DIAGNOSIS — H3530 Unspecified macular degeneration: Secondary | ICD-10-CM | POA: Diagnosis not present

## 2021-05-21 DIAGNOSIS — G20 Parkinson's disease: Secondary | ICD-10-CM | POA: Diagnosis not present

## 2021-05-21 DIAGNOSIS — I129 Hypertensive chronic kidney disease with stage 1 through stage 4 chronic kidney disease, or unspecified chronic kidney disease: Secondary | ICD-10-CM | POA: Diagnosis not present

## 2021-05-21 DIAGNOSIS — E538 Deficiency of other specified B group vitamins: Secondary | ICD-10-CM | POA: Diagnosis not present

## 2021-05-21 DIAGNOSIS — Z6836 Body mass index (BMI) 36.0-36.9, adult: Secondary | ICD-10-CM | POA: Diagnosis not present

## 2021-05-21 DIAGNOSIS — E669 Obesity, unspecified: Secondary | ICD-10-CM | POA: Diagnosis not present

## 2021-05-21 DIAGNOSIS — S82851D Displaced trimalleolar fracture of right lower leg, subsequent encounter for closed fracture with routine healing: Secondary | ICD-10-CM | POA: Diagnosis not present

## 2021-05-21 DIAGNOSIS — Z9181 History of falling: Secondary | ICD-10-CM | POA: Diagnosis not present

## 2021-05-21 DIAGNOSIS — R339 Retention of urine, unspecified: Secondary | ICD-10-CM | POA: Diagnosis not present

## 2021-05-23 DIAGNOSIS — F419 Anxiety disorder, unspecified: Secondary | ICD-10-CM | POA: Diagnosis not present

## 2021-05-23 DIAGNOSIS — F32A Depression, unspecified: Secondary | ICD-10-CM | POA: Diagnosis not present

## 2021-05-23 DIAGNOSIS — I129 Hypertensive chronic kidney disease with stage 1 through stage 4 chronic kidney disease, or unspecified chronic kidney disease: Secondary | ICD-10-CM | POA: Diagnosis not present

## 2021-05-23 DIAGNOSIS — S82851D Displaced trimalleolar fracture of right lower leg, subsequent encounter for closed fracture with routine healing: Secondary | ICD-10-CM | POA: Diagnosis not present

## 2021-05-23 DIAGNOSIS — Z20822 Contact with and (suspected) exposure to covid-19: Secondary | ICD-10-CM | POA: Diagnosis not present

## 2021-05-23 DIAGNOSIS — N1832 Chronic kidney disease, stage 3b: Secondary | ICD-10-CM | POA: Diagnosis not present

## 2021-05-23 DIAGNOSIS — G20 Parkinson's disease: Secondary | ICD-10-CM | POA: Diagnosis not present

## 2021-05-30 DIAGNOSIS — N1832 Chronic kidney disease, stage 3b: Secondary | ICD-10-CM | POA: Diagnosis not present

## 2021-05-30 DIAGNOSIS — F32A Depression, unspecified: Secondary | ICD-10-CM | POA: Diagnosis not present

## 2021-05-30 DIAGNOSIS — S82851D Displaced trimalleolar fracture of right lower leg, subsequent encounter for closed fracture with routine healing: Secondary | ICD-10-CM | POA: Diagnosis not present

## 2021-05-30 DIAGNOSIS — I129 Hypertensive chronic kidney disease with stage 1 through stage 4 chronic kidney disease, or unspecified chronic kidney disease: Secondary | ICD-10-CM | POA: Diagnosis not present

## 2021-05-30 DIAGNOSIS — G20 Parkinson's disease: Secondary | ICD-10-CM | POA: Diagnosis not present

## 2021-05-30 DIAGNOSIS — F419 Anxiety disorder, unspecified: Secondary | ICD-10-CM | POA: Diagnosis not present

## 2021-05-31 ENCOUNTER — Ambulatory Visit: Payer: Medicare Other | Admitting: Orthopedic Surgery

## 2021-06-05 DIAGNOSIS — I129 Hypertensive chronic kidney disease with stage 1 through stage 4 chronic kidney disease, or unspecified chronic kidney disease: Secondary | ICD-10-CM | POA: Diagnosis not present

## 2021-06-05 DIAGNOSIS — S82851D Displaced trimalleolar fracture of right lower leg, subsequent encounter for closed fracture with routine healing: Secondary | ICD-10-CM | POA: Diagnosis not present

## 2021-06-05 DIAGNOSIS — F32A Depression, unspecified: Secondary | ICD-10-CM | POA: Diagnosis not present

## 2021-06-05 DIAGNOSIS — F419 Anxiety disorder, unspecified: Secondary | ICD-10-CM | POA: Diagnosis not present

## 2021-06-05 DIAGNOSIS — N1832 Chronic kidney disease, stage 3b: Secondary | ICD-10-CM | POA: Diagnosis not present

## 2021-06-05 DIAGNOSIS — G20 Parkinson's disease: Secondary | ICD-10-CM | POA: Diagnosis not present

## 2021-06-06 ENCOUNTER — Ambulatory Visit: Payer: Medicare Other | Admitting: Orthopedic Surgery

## 2021-06-11 DIAGNOSIS — S82851D Displaced trimalleolar fracture of right lower leg, subsequent encounter for closed fracture with routine healing: Secondary | ICD-10-CM | POA: Diagnosis not present

## 2021-06-11 DIAGNOSIS — G20 Parkinson's disease: Secondary | ICD-10-CM | POA: Diagnosis not present

## 2021-06-11 DIAGNOSIS — I129 Hypertensive chronic kidney disease with stage 1 through stage 4 chronic kidney disease, or unspecified chronic kidney disease: Secondary | ICD-10-CM | POA: Diagnosis not present

## 2021-06-11 DIAGNOSIS — F419 Anxiety disorder, unspecified: Secondary | ICD-10-CM | POA: Diagnosis not present

## 2021-06-11 DIAGNOSIS — F32A Depression, unspecified: Secondary | ICD-10-CM | POA: Diagnosis not present

## 2021-06-11 DIAGNOSIS — N1832 Chronic kidney disease, stage 3b: Secondary | ICD-10-CM | POA: Diagnosis not present

## 2021-06-19 DIAGNOSIS — F419 Anxiety disorder, unspecified: Secondary | ICD-10-CM | POA: Diagnosis not present

## 2021-06-19 DIAGNOSIS — F32A Depression, unspecified: Secondary | ICD-10-CM | POA: Diagnosis not present

## 2021-06-19 DIAGNOSIS — S82851D Displaced trimalleolar fracture of right lower leg, subsequent encounter for closed fracture with routine healing: Secondary | ICD-10-CM | POA: Diagnosis not present

## 2021-06-19 DIAGNOSIS — I129 Hypertensive chronic kidney disease with stage 1 through stage 4 chronic kidney disease, or unspecified chronic kidney disease: Secondary | ICD-10-CM | POA: Diagnosis not present

## 2021-06-19 DIAGNOSIS — N1832 Chronic kidney disease, stage 3b: Secondary | ICD-10-CM | POA: Diagnosis not present

## 2021-06-19 DIAGNOSIS — G20 Parkinson's disease: Secondary | ICD-10-CM | POA: Diagnosis not present

## 2021-06-20 DIAGNOSIS — N1832 Chronic kidney disease, stage 3b: Secondary | ICD-10-CM | POA: Diagnosis not present

## 2021-06-20 DIAGNOSIS — R339 Retention of urine, unspecified: Secondary | ICD-10-CM | POA: Diagnosis not present

## 2021-06-20 DIAGNOSIS — I129 Hypertensive chronic kidney disease with stage 1 through stage 4 chronic kidney disease, or unspecified chronic kidney disease: Secondary | ICD-10-CM | POA: Diagnosis not present

## 2021-06-20 DIAGNOSIS — Z6836 Body mass index (BMI) 36.0-36.9, adult: Secondary | ICD-10-CM | POA: Diagnosis not present

## 2021-06-20 DIAGNOSIS — Z9181 History of falling: Secondary | ICD-10-CM | POA: Diagnosis not present

## 2021-06-20 DIAGNOSIS — G20 Parkinson's disease: Secondary | ICD-10-CM | POA: Diagnosis not present

## 2021-06-20 DIAGNOSIS — K529 Noninfective gastroenteritis and colitis, unspecified: Secondary | ICD-10-CM | POA: Diagnosis not present

## 2021-06-20 DIAGNOSIS — H3530 Unspecified macular degeneration: Secondary | ICD-10-CM | POA: Diagnosis not present

## 2021-06-20 DIAGNOSIS — E538 Deficiency of other specified B group vitamins: Secondary | ICD-10-CM | POA: Diagnosis not present

## 2021-06-20 DIAGNOSIS — F419 Anxiety disorder, unspecified: Secondary | ICD-10-CM | POA: Diagnosis not present

## 2021-06-20 DIAGNOSIS — S82851D Displaced trimalleolar fracture of right lower leg, subsequent encounter for closed fracture with routine healing: Secondary | ICD-10-CM | POA: Diagnosis not present

## 2021-06-20 DIAGNOSIS — E669 Obesity, unspecified: Secondary | ICD-10-CM | POA: Diagnosis not present

## 2021-06-20 DIAGNOSIS — Z7982 Long term (current) use of aspirin: Secondary | ICD-10-CM | POA: Diagnosis not present

## 2021-06-20 DIAGNOSIS — F32A Depression, unspecified: Secondary | ICD-10-CM | POA: Diagnosis not present

## 2021-06-23 DIAGNOSIS — F419 Anxiety disorder, unspecified: Secondary | ICD-10-CM | POA: Diagnosis not present

## 2021-06-23 DIAGNOSIS — F32A Depression, unspecified: Secondary | ICD-10-CM | POA: Diagnosis not present

## 2021-06-23 DIAGNOSIS — N1832 Chronic kidney disease, stage 3b: Secondary | ICD-10-CM | POA: Diagnosis not present

## 2021-06-23 DIAGNOSIS — G20 Parkinson's disease: Secondary | ICD-10-CM | POA: Diagnosis not present

## 2021-06-23 DIAGNOSIS — I129 Hypertensive chronic kidney disease with stage 1 through stage 4 chronic kidney disease, or unspecified chronic kidney disease: Secondary | ICD-10-CM | POA: Diagnosis not present

## 2021-06-23 DIAGNOSIS — S82851D Displaced trimalleolar fracture of right lower leg, subsequent encounter for closed fracture with routine healing: Secondary | ICD-10-CM | POA: Diagnosis not present

## 2021-07-03 DIAGNOSIS — G20 Parkinson's disease: Secondary | ICD-10-CM | POA: Diagnosis not present

## 2021-07-04 DIAGNOSIS — G20 Parkinson's disease: Secondary | ICD-10-CM | POA: Diagnosis not present

## 2021-07-04 DIAGNOSIS — F419 Anxiety disorder, unspecified: Secondary | ICD-10-CM | POA: Diagnosis not present

## 2021-07-04 DIAGNOSIS — F32A Depression, unspecified: Secondary | ICD-10-CM | POA: Diagnosis not present

## 2021-07-04 DIAGNOSIS — I129 Hypertensive chronic kidney disease with stage 1 through stage 4 chronic kidney disease, or unspecified chronic kidney disease: Secondary | ICD-10-CM | POA: Diagnosis not present

## 2021-07-04 DIAGNOSIS — N1832 Chronic kidney disease, stage 3b: Secondary | ICD-10-CM | POA: Diagnosis not present

## 2021-07-04 DIAGNOSIS — S82851D Displaced trimalleolar fracture of right lower leg, subsequent encounter for closed fracture with routine healing: Secondary | ICD-10-CM | POA: Diagnosis not present

## 2021-07-19 DIAGNOSIS — G20 Parkinson's disease: Secondary | ICD-10-CM | POA: Diagnosis not present

## 2021-07-19 DIAGNOSIS — N1832 Chronic kidney disease, stage 3b: Secondary | ICD-10-CM | POA: Diagnosis not present

## 2021-07-19 DIAGNOSIS — F419 Anxiety disorder, unspecified: Secondary | ICD-10-CM | POA: Diagnosis not present

## 2021-07-19 DIAGNOSIS — I129 Hypertensive chronic kidney disease with stage 1 through stage 4 chronic kidney disease, or unspecified chronic kidney disease: Secondary | ICD-10-CM | POA: Diagnosis not present

## 2021-07-19 DIAGNOSIS — F32A Depression, unspecified: Secondary | ICD-10-CM | POA: Diagnosis not present

## 2021-07-19 DIAGNOSIS — S82851D Displaced trimalleolar fracture of right lower leg, subsequent encounter for closed fracture with routine healing: Secondary | ICD-10-CM | POA: Diagnosis not present

## 2021-07-20 DIAGNOSIS — I129 Hypertensive chronic kidney disease with stage 1 through stage 4 chronic kidney disease, or unspecified chronic kidney disease: Secondary | ICD-10-CM | POA: Diagnosis not present

## 2021-07-20 DIAGNOSIS — E669 Obesity, unspecified: Secondary | ICD-10-CM | POA: Diagnosis not present

## 2021-07-20 DIAGNOSIS — G20 Parkinson's disease: Secondary | ICD-10-CM | POA: Diagnosis not present

## 2021-07-20 DIAGNOSIS — N1832 Chronic kidney disease, stage 3b: Secondary | ICD-10-CM | POA: Diagnosis not present

## 2021-07-20 DIAGNOSIS — R339 Retention of urine, unspecified: Secondary | ICD-10-CM | POA: Diagnosis not present

## 2021-07-20 DIAGNOSIS — S82851D Displaced trimalleolar fracture of right lower leg, subsequent encounter for closed fracture with routine healing: Secondary | ICD-10-CM | POA: Diagnosis not present

## 2021-07-20 DIAGNOSIS — F32A Depression, unspecified: Secondary | ICD-10-CM | POA: Diagnosis not present

## 2021-07-20 DIAGNOSIS — E538 Deficiency of other specified B group vitamins: Secondary | ICD-10-CM | POA: Diagnosis not present

## 2021-07-20 DIAGNOSIS — K529 Noninfective gastroenteritis and colitis, unspecified: Secondary | ICD-10-CM | POA: Diagnosis not present

## 2021-07-20 DIAGNOSIS — Z6836 Body mass index (BMI) 36.0-36.9, adult: Secondary | ICD-10-CM | POA: Diagnosis not present

## 2021-07-20 DIAGNOSIS — Z7982 Long term (current) use of aspirin: Secondary | ICD-10-CM | POA: Diagnosis not present

## 2021-07-20 DIAGNOSIS — F419 Anxiety disorder, unspecified: Secondary | ICD-10-CM | POA: Diagnosis not present

## 2021-07-20 DIAGNOSIS — Z9181 History of falling: Secondary | ICD-10-CM | POA: Diagnosis not present

## 2021-07-20 DIAGNOSIS — H3530 Unspecified macular degeneration: Secondary | ICD-10-CM | POA: Diagnosis not present

## 2021-07-26 DIAGNOSIS — F32A Depression, unspecified: Secondary | ICD-10-CM | POA: Diagnosis not present

## 2021-07-26 DIAGNOSIS — N1832 Chronic kidney disease, stage 3b: Secondary | ICD-10-CM | POA: Diagnosis not present

## 2021-07-26 DIAGNOSIS — S82851D Displaced trimalleolar fracture of right lower leg, subsequent encounter for closed fracture with routine healing: Secondary | ICD-10-CM | POA: Diagnosis not present

## 2021-07-26 DIAGNOSIS — G20 Parkinson's disease: Secondary | ICD-10-CM | POA: Diagnosis not present

## 2021-07-26 DIAGNOSIS — I129 Hypertensive chronic kidney disease with stage 1 through stage 4 chronic kidney disease, or unspecified chronic kidney disease: Secondary | ICD-10-CM | POA: Diagnosis not present

## 2021-07-26 DIAGNOSIS — F419 Anxiety disorder, unspecified: Secondary | ICD-10-CM | POA: Diagnosis not present

## 2021-08-08 DIAGNOSIS — I129 Hypertensive chronic kidney disease with stage 1 through stage 4 chronic kidney disease, or unspecified chronic kidney disease: Secondary | ICD-10-CM | POA: Diagnosis not present

## 2021-08-08 DIAGNOSIS — N1832 Chronic kidney disease, stage 3b: Secondary | ICD-10-CM | POA: Diagnosis not present

## 2021-08-08 DIAGNOSIS — F419 Anxiety disorder, unspecified: Secondary | ICD-10-CM | POA: Diagnosis not present

## 2021-08-08 DIAGNOSIS — S82851D Displaced trimalleolar fracture of right lower leg, subsequent encounter for closed fracture with routine healing: Secondary | ICD-10-CM | POA: Diagnosis not present

## 2021-08-08 DIAGNOSIS — F32A Depression, unspecified: Secondary | ICD-10-CM | POA: Diagnosis not present

## 2021-08-08 DIAGNOSIS — G20 Parkinson's disease: Secondary | ICD-10-CM | POA: Diagnosis not present

## 2021-08-15 DIAGNOSIS — S82851D Displaced trimalleolar fracture of right lower leg, subsequent encounter for closed fracture with routine healing: Secondary | ICD-10-CM | POA: Diagnosis not present

## 2021-08-15 DIAGNOSIS — F32A Depression, unspecified: Secondary | ICD-10-CM | POA: Diagnosis not present

## 2021-08-15 DIAGNOSIS — F419 Anxiety disorder, unspecified: Secondary | ICD-10-CM | POA: Diagnosis not present

## 2021-08-15 DIAGNOSIS — I129 Hypertensive chronic kidney disease with stage 1 through stage 4 chronic kidney disease, or unspecified chronic kidney disease: Secondary | ICD-10-CM | POA: Diagnosis not present

## 2021-08-15 DIAGNOSIS — N1832 Chronic kidney disease, stage 3b: Secondary | ICD-10-CM | POA: Diagnosis not present

## 2021-08-15 DIAGNOSIS — G20 Parkinson's disease: Secondary | ICD-10-CM | POA: Diagnosis not present

## 2021-08-30 DIAGNOSIS — Z20822 Contact with and (suspected) exposure to covid-19: Secondary | ICD-10-CM | POA: Diagnosis not present

## 2021-08-31 DIAGNOSIS — R110 Nausea: Secondary | ICD-10-CM | POA: Diagnosis not present

## 2021-08-31 DIAGNOSIS — I499 Cardiac arrhythmia, unspecified: Secondary | ICD-10-CM | POA: Insufficient documentation

## 2021-08-31 DIAGNOSIS — R008 Other abnormalities of heart beat: Secondary | ICD-10-CM | POA: Diagnosis not present

## 2021-09-05 ENCOUNTER — Other Ambulatory Visit: Payer: Self-pay | Admitting: Family Medicine

## 2021-09-05 ENCOUNTER — Other Ambulatory Visit (HOSPITAL_COMMUNITY): Payer: Self-pay | Admitting: Family Medicine

## 2021-09-05 DIAGNOSIS — R112 Nausea with vomiting, unspecified: Secondary | ICD-10-CM

## 2021-09-12 ENCOUNTER — Ambulatory Visit: Payer: Medicare Other

## 2021-09-12 ENCOUNTER — Other Ambulatory Visit: Payer: Self-pay

## 2021-09-12 ENCOUNTER — Ambulatory Visit: Payer: Self-pay | Admitting: Orthopedic Surgery

## 2021-09-12 ENCOUNTER — Ambulatory Visit (INDEPENDENT_AMBULATORY_CARE_PROVIDER_SITE_OTHER): Payer: Medicare Other | Admitting: Orthopedic Surgery

## 2021-09-12 ENCOUNTER — Encounter: Payer: Self-pay | Admitting: Orthopedic Surgery

## 2021-09-12 VITALS — BP 115/82 | HR 86 | Ht 62.0 in | Wt 204.0 lb

## 2021-09-12 DIAGNOSIS — Z20828 Contact with and (suspected) exposure to other viral communicable diseases: Secondary | ICD-10-CM | POA: Diagnosis not present

## 2021-09-12 DIAGNOSIS — S82401G Unspecified fracture of shaft of right fibula, subsequent encounter for closed fracture with delayed healing: Secondary | ICD-10-CM

## 2021-09-12 DIAGNOSIS — S82201G Unspecified fracture of shaft of right tibia, subsequent encounter for closed fracture with delayed healing: Secondary | ICD-10-CM

## 2021-09-12 DIAGNOSIS — Z1159 Encounter for screening for other viral diseases: Secondary | ICD-10-CM | POA: Diagnosis not present

## 2021-09-12 DIAGNOSIS — Z01818 Encounter for other preprocedural examination: Secondary | ICD-10-CM

## 2021-09-12 NOTE — Progress Notes (Signed)
Orthopaedic Postop Note  Assessment: Tonya Perez is a 75 y.o. female s/p ORIF of Right ankle fracture (32/09/2481), complicated by wound drainage and breakdown requiring I&D, removal of screw and revision closure (01/19/2021); recurrent postoperative infection  Plan: She continues to have drainage, and a scab directly overlying the surgical hardware.  As we have previously discussed, we can plan to proceed with removal of the hardware.  There have never been any issues on the medial side of her ankle, but we will plan to remove the screws nonetheless.  This will be an outpatient procedure, we will continue antibiotics following surgery for an additional 4-6 weeks.  We will plan to obtain cultures.  Risks and benefits of the procedure were discussed with the patient and her husband.  Risks including, but not limited to, infection, bleeding, damage to surrounding structures, need for further surgery, increased risk of refracture and more severe complications associated with anesthesia.  They elected to proceed.  Surgery will be scheduled for September 25, 2021.  Follow-up: Return for After surgery; 2 weeks postop, DOS 09/25/21.   XR at next visit: XR R ankle  Subjective:  Chief Complaint  Patient presents with   Routine Post Op    Rt ankle DOS 10/11/20 and 01/19/21    History of Present Illness: Tonya Perez is a 75 y.o. female who presents following the above stated procedure.  She was last seen in clinic approximately 4 months ago.  She continues to have some drainage from the right ankle.  She has a scab, and when the scab falls off, the patient's husband states that he can see some hardware.  She has Parkinson's, which has progressively worsened since surgery.  It is unclear if there is any correlation between the infection and her neurologic condition.  No fevers or chills.  She is not taking any antibiotics at this time.   Review of Systems: No fevers or chills No numbness or tingling No  Chest Pain No shortness of breath   Objective: BP 115/82   Pulse 86   Ht 5\' 2"  (1.575 m)   Wt 204 lb (92.5 kg)   BMI 37.31 kg/m   Physical Exam:  Right lateral ankle is mostly healing well.  There is no obvious swelling, deformity or erythema.  Over the mid aspect of the incision, there is a scab.  No active drainage.  Mild tenderness to palpation over the lateral ankle.  She does tolerate gentle range of motion of the ankle.  No tenderness palpation over the medial ankle.  Medial surgical incision is healing well, without surrounding erythema or drainage.  Toes are warm and well-perfused.  IMAGING: I personally ordered and reviewed the following images  X-ray of the right ankle was obtained in clinic today.  This was compared to previous x-rays.  There has been no interval displacement at the fracture sites.  These are well-healed.  Hardware remains intact.  No evidence of failure or loosening.  There is some lucency around the syndesmotic screw, but this has not started to back out.  The screws not broken.  Impression: Healed right ankle fracture, without evidence of hardware failure.  [name], MD 09/12/2021 10:19 AM

## 2021-09-12 NOTE — H&P (View-Only) (Signed)
Orthopaedic Postop Note  Assessment: Tonya Perez is a 75 y.o. female s/p ORIF of Right ankle fracture (27/74/[birth year]), complicated by wound drainage and breakdown requiring I&D, removal of screw and revision closure (01/19/2021); recurrent postoperative infection  Plan: She continues to have drainage, and a scab directly overlying the surgical hardware.  As we have previously discussed, we can plan to proceed with removal of the hardware.  There have never been any issues on the medial side of her ankle, but we will plan to remove the screws nonetheless.  This will be an outpatient procedure, we will continue antibiotics following surgery for an additional 4-6 weeks.  We will plan to obtain cultures.  Risks and benefits of the procedure were discussed with the patient and her husband.  Risks including, but not limited to, infection, bleeding, damage to surrounding structures, need for further surgery, increased risk of refracture and more severe complications associated with anesthesia.  They elected to proceed.  Surgery will be scheduled for September 25, 2021.  Follow-up: Return for After surgery; 2 weeks postop, DOS 09/25/21.   XR at next visit: XR R ankle  Subjective:  Chief Complaint  Patient presents with   Routine Post Op    Rt ankle DOS 10/11/20 and 01/19/21    History of Present Illness: Tonya Perez is a 75 y.o. female who presents following the above stated procedure.  She was last seen in clinic approximately 4 months ago.  She continues to have some drainage from the right ankle.  She has a scab, and when the scab falls off, the patient's husband states that he can see some hardware.  She has Parkinson's, which has progressively worsened since surgery.  It is unclear if there is any correlation between the infection and her neurologic condition.  No fevers or chills.  She is not taking any antibiotics at this time.   Review of Systems: No fevers or chills No numbness or tingling No  Chest Pain No shortness of breath   Objective: BP 115/82   Pulse 86   Ht 5\' 2"  (1.575 m)   Wt 204 lb (92.5 kg)   BMI 37.31 kg/m   Physical Exam:  Right lateral ankle is mostly healing well.  There is no obvious swelling, deformity or erythema.  Over the mid aspect of the incision, there is a scab.  No active drainage.  Mild tenderness to palpation over the lateral ankle.  She does tolerate gentle range of motion of the ankle.  No tenderness palpation over the medial ankle.  Medial surgical incision is healing well, without surrounding erythema or drainage.  Toes are warm and well-perfused.  IMAGING: I personally ordered and reviewed the following images  X-ray of the right ankle was obtained in clinic today.  This was compared to previous x-rays.  There has been no interval displacement at the fracture sites.  These are well-healed.  Hardware remains intact.  No evidence of failure or loosening.  There is some lucency around the syndesmotic screw, but this has not started to back out.  The screws not broken.  Impression: Healed right ankle fracture, without evidence of hardware failure.  [name], MD 09/12/2021 10:19 AM

## 2021-09-12 NOTE — Patient Instructions (Signed)
Plan for surgery 09/25/2021  Hardware removal right ankle.  Plan for 4-6 weeks of antibiotics following surgery.

## 2021-09-19 DIAGNOSIS — Z1159 Encounter for screening for other viral diseases: Secondary | ICD-10-CM | POA: Diagnosis not present

## 2021-09-19 DIAGNOSIS — Z20828 Contact with and (suspected) exposure to other viral communicable diseases: Secondary | ICD-10-CM | POA: Diagnosis not present

## 2021-09-20 NOTE — Patient Instructions (Signed)
Tonya Perez  09/20/2021     @PREFPERIOPPHARMACY @   Your procedure is scheduled on 09/25/2021.   Report to [facility] at  [phone]  A.M.   Call this number if you have problems the morning of surgery:  [phone]   Remember:  Do not eat or drink after midnight.      Take these medicines the morning of surgery with A SIP OF WATER                               None     Do not wear jewelry, make-up or nail polish.  Do not wear lotions, powders, or perfumes, or deodorant.  Do not shave 48 hours prior to surgery.  Men may shave face and neck.  Do not bring valuables to the hospital.  [facility] is not responsible for any belongings or valuables.  Contacts, dentures or bridgework may not be worn into surgery.  Leave your suitcase in the car.  After surgery it may be brought to your room.  For patients admitted to the hospital, discharge time will be determined by your treatment team.  Patients discharged the day of surgery will not be allowed to drive home and must have someone with them for 24 hours.    Special instructions:   DO NOT smoke tobacco or vape for 24 hours before your procedure.  Please read over the following fact sheets that you were given. Coughing and Deep Breathing, Surgical Site Infection Prevention, Anesthesia Post-op Instructions, and Care and Recovery After Surgery      Orthopedic Hardware Removal, Care After This sheet gives you information about how to care for yourself after your procedure. Your health care provider may also give you more specific instructions. If you have problems or questions, contact your health care provider. What can I expect after the procedure? After the procedure, it is common to have: Soreness or pain. Some swelling in the area where the hardware was removed. A small amount of blood or clear fluid coming from your incision. Follow these instructions at home: If you have a cast: Do not stick anything inside the  cast to scratch your skin. Doing that increases your risk of infection. Check the skin around the cast every day. Tell your health care provider about any concerns. You may put lotion on dry skin around the edges of the cast. Do not put lotion on the skin underneath the cast. Keep the cast clean and dry. If you have a splint or boot: Wear the splint or boot as told by your health care provider. Remove it only as told by your health care provider. Loosen the splint or boot if your fingers or toes tingle, become numb, or turn cold and blue. Keep the splint or boot clean and dry. Bathing Do not take baths, swim, or use a hot tub until your health care provider approves. Ask your health care provider if you may take showers. You may only be allowed to take sponge baths. Keep the bandage (dressing) dry until your health care provider says it can be removed. If your cast, splint, or boot is not waterproof: Do not let it get wet. Cover it with a watertight covering when you take a bath or a shower. Incision care  Follow instructions from your health care provider about how to take care of your incision. Make sure you: Wash your  hands with soap and water before you change your dressing. If soap and water are not available, use hand sanitizer. Change your dressing as told by your health care provider. Leave stitches (sutures), skin glue, or adhesive strips in place. These skin closures may need to stay in place for 2 weeks or longer. If adhesive strip edges start to loosen and curl up, you may trim the loose edges. Do not remove adhesive strips completely unless your health care provider tells you to do that. Check your incision area every day for signs of infection. Check for: Redness. More swelling or pain. More fluid or blood. Warmth. Pus or a bad smell. Managing pain, stiffness, and swelling  If directed, put ice on the affected area: If you have a removable splint or boot, remove it as told  by your health care provider. Put ice in a plastic bag. Place a towel between your skin and the bag. Leave the ice on for 20 minutes, 2-3 times a day. Move your fingers or toes often to avoid stiffness and to lessen swelling. Raise (elevate) the injured area above the level of your heart while you are sitting or lying down. Driving Do not drive or use heavy machinery while taking prescription pain medicine. Do not drive for 24 hours if you were given a medicine to help you relax (sedative) during your procedure. Ask your health care provider when it is safe to drive if you have a cast, splint, or boot on the affected limb. Activity Ask your health care provider what activities are safe for you during recovery, and ask what activities you need to avoid. Do not use the injured limb to support your body weight until your health care provider says that you can. Do not play contact sports until your health care provider approves. Do exercises as told by your health care provider. Avoid sitting for a long time without moving. Get up and move around at least every few hours. This will help prevent blood clots. General instructions Do not put pressure on any part of the cast or splint until it is fully hardened. This may take several hours. If you are taking prescription pain medicine, take actions to prevent or treat constipation. Your health care provider may recommend that you: Drink enough fluid to keep your urine pale yellow. Eat foods that are high in fiber, such as fresh fruits and vegetables, whole grains, and beans. Limit foods that are high in fat and processed sugars, such as fried or sweet foods. Take an over-the-counter or prescription medicine for constipation. Do not use any products that contain nicotine or tobacco, such as cigarettes and e-cigarettes. These can delay bone healing after surgery. If you need help quitting, ask your health care provider. Take over-the-counter and  prescription medicines only as told by your health care provider. Keep all follow-up visits as told by your health care provider. This is important. Contact a health care provider if: You have lasting pain. You have redness around your incision. You have more swelling or pain around your incision. You have more fluid or blood coming from your incision. Your incision feels warm to the touch. You have pus or a bad smell coming from your incision. You are unable to do exercises or physical activity as told by your health care provider. Get help right away if: You have difficulty breathing. You have chest pain. You have severe pain. You have a fever or chills. You have numbness for more than 24 hours  in the area where the hardware was removed. Summary After the procedure, it is common to have some pain and swelling in the area where the hardware was removed. Follow instructions from your health care provider about how to take care of your incision. Return to your normal activities as told by your health care provider. Ask your health care provider what activities are safe for you. This information is not intended to replace advice given to you by your health care provider. Make sure you discuss any questions you have with your health care provider. Document Revised: 12/23/2020 Document Reviewed: 12/23/2020 Elsevier Patient Education  [place] Anesthesia, Adult, Care After This sheet gives you information about how to care for yourself after your procedure. Your health care provider may also give you more specific instructions. If you have problems or questions, contact your health care provider. What can I expect after the procedure? After the procedure, the following side effects are common: Pain or discomfort at the IV site. Nausea. Vomiting. Sore throat. Trouble concentrating. Feeling cold or chills. Feeling weak or tired. Sleepiness and fatigue. Soreness and body  aches. These side effects can affect parts of the body that were not involved in surgery. Follow these instructions at home: For the time period you were told by your health care provider:  Rest. Do not participate in activities where you could fall or become injured. Do not drive or use machinery. Do not drink alcohol. Do not take sleeping pills or medicines that cause drowsiness. Do not make important decisions or sign legal documents. Do not take care of children on your own. Eating and drinking Follow any instructions from your health care provider about eating or drinking restrictions. When you feel hungry, start by eating small amounts of foods that are soft and easy to digest (bland), such as toast. Gradually return to your regular diet. Drink enough fluid to keep your urine pale yellow. If you vomit, rehydrate by drinking water, juice, or clear broth. General instructions If you have sleep apnea, surgery and certain medicines can increase your risk for breathing problems. Follow instructions from your health care provider about wearing your sleep device: Anytime you are sleeping, including during daytime naps. While taking prescription pain medicines, sleeping medicines, or medicines that make you drowsy. Have a responsible adult stay with you for the time you are told. It is important to have someone help care for you until you are awake and alert. Return to your normal activities as told by your health care provider. Ask your health care provider what activities are safe for you. Take over-the-counter and prescription medicines only as told by your health care provider. If you smoke, do not smoke without supervision. Keep all follow-up visits as told by your health care provider. This is important. Contact a health care provider if: You have nausea or vomiting that does not get better with medicine. You cannot eat or drink without vomiting. You have pain that does not get better  with medicine. You are unable to pass urine. You develop a skin rash. You have a fever. You have redness around your IV site that gets worse. Get help right away if: You have difficulty breathing. You have chest pain. You have blood in your urine or stool, or you vomit blood. Summary After the procedure, it is common to have a sore throat or nausea. It is also common to feel tired. Have a responsible adult stay with you for the time you are  told. It is important to have someone help care for you until you are awake and alert. When you feel hungry, start by eating small amounts of foods that are soft and easy to digest (bland), such as toast. Gradually return to your regular diet. Drink enough fluid to keep your urine pale yellow. Return to your normal activities as told by your health care provider. Ask your health care provider what activities are safe for you. This information is not intended to replace advice given to you by your health care provider. Make sure you discuss any questions you have with your health care provider. Document Revised: 06/23/2020 Document Reviewed: 01/21/2020 Elsevier Patient Education  2022 [employer]. How to Use Chlorhexidine for Bathing Chlorhexidine gluconate (CHG) is a germ-killing (antiseptic) solution that is used to clean the skin. It can get rid of the bacteria that normally live on the skin and can keep them away for about 24 hours. To clean your skin with CHG, you may be given: A CHG solution to use in the shower or as part of a sponge bath. A prepackaged cloth that contains CHG. Cleaning your skin with CHG may help lower the risk for infection: While you are staying in the intensive care unit of the hospital. If you have a vascular access, such as a central line, to provide short-term or long-term access to your veins. If you have a catheter to drain urine from your bladder. If you are on a ventilator. A ventilator is a machine that helps you  breathe by moving air in and out of your lungs. After surgery. What are the risks? Risks of using CHG include: A skin reaction. Hearing loss, if CHG gets in your ears and you have a perforated eardrum. Eye injury, if CHG gets in your eyes and is not rinsed out. The CHG product catching fire. Make sure that you avoid smoking and flames after applying CHG to your skin. Do not use CHG: If you have a chlorhexidine allergy or have previously reacted to chlorhexidine. On babies younger than 22 months of age. How to use CHG solution Use CHG only as told by your health care provider, and follow the instructions on the label. Use the full amount of CHG as directed. Usually, this is one bottle. During a shower Follow these steps when using CHG solution during a shower (unless your health care provider gives you different instructions): Start the shower. Use your normal soap and shampoo to wash your face and hair. Turn off the shower or move out of the shower stream. Pour the CHG onto a clean washcloth. Do not use any type of brush or rough-edged sponge. Starting at your neck, lather your body down to your toes. Make sure you follow these instructions: If you will be having surgery, pay special attention to the part of your body where you will be having surgery. Scrub this area for at least 1 minute. Do not use CHG on your head or face. If the solution gets into your ears or eyes, rinse them well with water. Avoid your genital area. Avoid any areas of skin that have broken skin, cuts, or scrapes. Scrub your back and under your arms. Make sure to wash skin folds. Let the lather sit on your skin for 1-2 minutes or as long as told by your health care provider. Thoroughly rinse your entire body in the shower. Make sure that all body creases and crevices are rinsed well. Dry off with a clean towel.  Do not put any substances on your body afterward--such as powder, lotion, or perfume--unless you are told  to do so by your health care provider. Only use lotions that are recommended by the manufacturer. Put on clean clothes or pajamas. If it is the night before your surgery, sleep in clean sheets.  During a sponge bath Follow these steps when using CHG solution during a sponge bath (unless your health care provider gives you different instructions): Use your normal soap and shampoo to wash your face and hair. Pour the CHG onto a clean washcloth. Starting at your neck, lather your body down to your toes. Make sure you follow these instructions: If you will be having surgery, pay special attention to the part of your body where you will be having surgery. Scrub this area for at least 1 minute. Do not use CHG on your head or face. If the solution gets into your ears or eyes, rinse them well with water. Avoid your genital area. Avoid any areas of skin that have broken skin, cuts, or scrapes. Scrub your back and under your arms. Make sure to wash skin folds. Let the lather sit on your skin for 1-2 minutes or as long as told by your health care provider. Using a different clean, wet washcloth, thoroughly rinse your entire body. Make sure that all body creases and crevices are rinsed well. Dry off with a clean towel. Do not put any substances on your body afterward--such as powder, lotion, or perfume--unless you are told to do so by your health care provider. Only use lotions that are recommended by the manufacturer. Put on clean clothes or pajamas. If it is the night before your surgery, sleep in clean sheets. How to use CHG prepackaged cloths Only use CHG cloths as told by your health care provider, and follow the instructions on the label. Use the CHG cloth on clean, dry skin. Do not use the CHG cloth on your head or face unless your health care provider tells you to. When washing with the CHG cloth: Avoid your genital area. Avoid any areas of skin that have broken skin, cuts, or scrapes. Before  surgery Follow these steps when using a CHG cloth to clean before surgery (unless your health care provider gives you different instructions): Using the CHG cloth, vigorously scrub the part of your body where you will be having surgery. Scrub using a back-and-forth motion for 3 minutes. The area on your body should be completely wet with CHG when you are done scrubbing. Do not rinse. Discard the cloth and let the area air-dry. Do not put any substances on the area afterward, such as powder, lotion, or perfume. Put on clean clothes or pajamas. If it is the night before your surgery, sleep in clean sheets.  For general bathing Follow these steps when using CHG cloths for general bathing (unless your health care provider gives you different instructions). Use a separate CHG cloth for each area of your body. Make sure you wash between any folds of skin and between your fingers and toes. Wash your body in the following order, switching to a new cloth after each step: The front of your neck, shoulders, and chest. Both of your arms, under your arms, and your hands. Your stomach and groin area, avoiding the genitals. Your right leg and foot. Your left leg and foot. The back of your neck, your back, and your buttocks. Do not rinse. Discard the cloth and let the area air-dry. Do not  put any substances on your body afterward--such as powder, lotion, or perfume--unless you are told to do so by your health care provider. Only use lotions that are recommended by the manufacturer. Put on clean clothes or pajamas. Contact a health care provider if: Your skin gets irritated after scrubbing. You have questions about using your solution or cloth. You swallow any chlorhexidine. Call your local poison control center (1-628-238-2987 in the [place] Get help right away if: Your eyes itch badly, or they become very red or swollen. Your skin itches badly and is red or swollen. Your hearing changes. You have trouble  seeing. You have swelling or tingling in your mouth or throat. You have trouble breathing. These symptoms may represent a serious problem that is an emergency. Do not wait to see if the symptoms will go away. Get medical help right away. Call your local emergency services (911 in the [place] Do not drive yourself to the hospital. Summary Chlorhexidine gluconate (CHG) is a germ-killing (antiseptic) solution that is used to clean the skin. Cleaning your skin with CHG may help to lower your risk for infection. You may be given CHG to use for bathing. It may be in a bottle or in a prepackaged cloth to use on your skin. Carefully follow your health care provider's instructions and the instructions on the product label. Do not use CHG if you have a chlorhexidine allergy. Contact your health care provider if your skin gets irritated after scrubbing. This information is not intended to replace advice given to you by your health care provider. Make sure you discuss any questions you have with your health care provider. Document Revised: 12/19/2020 Document Reviewed: 12/19/2020 Elsevier Patient Education  2022 [employer].

## 2021-09-21 ENCOUNTER — Encounter (HOSPITAL_COMMUNITY)
Admission: RE | Admit: 2021-09-21 | Discharge: 2021-09-21 | Disposition: A | Discharge status: Home / Self Care | Payer: Medicare Other | Source: Ambulatory Visit | Attending: Orthopedic Surgery | Admitting: Orthopedic Surgery

## 2021-09-21 ENCOUNTER — Encounter (HOSPITAL_COMMUNITY): Payer: Self-pay

## 2021-09-21 ENCOUNTER — Other Ambulatory Visit: Payer: Self-pay

## 2021-09-21 DIAGNOSIS — Z01818 Encounter for other preprocedural examination: Secondary | ICD-10-CM | POA: Diagnosis not present

## 2021-09-21 HISTORY — DX: Parkinson's disease: G20

## 2021-09-21 HISTORY — DX: Parkinson's disease without dyskinesia, without mention of fluctuations: G20.A1

## 2021-09-21 LAB — BASIC METABOLIC PANEL
Anion gap: 9 (ref 5–15)
BUN: 14 mg/dL (ref 8–23)
CO2: 24 mmol/L (ref 22–32)
Calcium: 9 mg/dL (ref 8.9–10.3)
Chloride: 111 mmol/L (ref 98–111)
Creatinine, Ser: 1.04 mg/dL — ABNORMAL HIGH (ref 0.44–1.00)
GFR, Estimated: 57 mL/min — ABNORMAL LOW (ref >60)
Glucose, Bld: 90 mg/dL (ref 70–99)
Potassium: 3.6 mmol/L (ref 3.5–5.1)
Sodium: 144 mmol/L (ref 135–145)

## 2021-09-21 LAB — CBC
HCT: 42.9 % (ref 36.0–46.0)
Hemoglobin: 13.6 g/dL (ref 12.0–15.0)
MCH: 33.1 pg (ref 26.0–34.0)
MCHC: 31.7 g/dL (ref 30.0–36.0)
MCV: 104.4 fL — ABNORMAL HIGH (ref 80.0–100.0)
Platelets: 213 10*3/uL (ref 150–400)
RBC: 4.11 MIL/uL (ref 3.87–5.11)
RDW: 16.8 % — ABNORMAL HIGH (ref 11.5–15.5)
WBC: 7.1 10*3/uL (ref 4.0–10.5)
nRBC: 0 % (ref 0.0–0.2)

## 2021-09-25 ENCOUNTER — Ambulatory Visit (HOSPITAL_COMMUNITY)
Admission: RE | Admit: 2021-09-25 | Discharge: 2021-09-25 | Disposition: A | Discharge status: Home / Self Care | Payer: Medicare Other | Source: Ambulatory Visit | Attending: Orthopedic Surgery | Admitting: Orthopedic Surgery

## 2021-09-25 ENCOUNTER — Encounter (HOSPITAL_COMMUNITY): Payer: Self-pay | Admitting: Orthopedic Surgery

## 2021-09-25 ENCOUNTER — Ambulatory Visit (HOSPITAL_COMMUNITY): Payer: Medicare Other | Admitting: Certified Registered"

## 2021-09-25 ENCOUNTER — Other Ambulatory Visit: Payer: Self-pay

## 2021-09-25 ENCOUNTER — Encounter (HOSPITAL_COMMUNITY)
Admission: RE | Disposition: A | Discharge status: Home / Self Care | Payer: Self-pay | Source: Ambulatory Visit | Attending: Orthopedic Surgery

## 2021-09-25 ENCOUNTER — Ambulatory Visit (HOSPITAL_COMMUNITY): Payer: Medicare Other

## 2021-09-25 DIAGNOSIS — Z79899 Other long term (current) drug therapy: Secondary | ICD-10-CM | POA: Insufficient documentation

## 2021-09-25 DIAGNOSIS — Z419 Encounter for procedure for purposes other than remedying health state, unspecified: Secondary | ICD-10-CM | POA: Diagnosis not present

## 2021-09-25 DIAGNOSIS — T8469XA Infection and inflammatory reaction due to internal fixation device of other site, initial encounter: Secondary | ICD-10-CM | POA: Diagnosis not present

## 2021-09-25 DIAGNOSIS — T847XXA Infection and inflammatory reaction due to other internal orthopedic prosthetic devices, implants and grafts, initial encounter: Secondary | ICD-10-CM

## 2021-09-25 DIAGNOSIS — L0889 Other specified local infections of the skin and subcutaneous tissue: Secondary | ICD-10-CM | POA: Diagnosis not present

## 2021-09-25 DIAGNOSIS — T8459XA Infection and inflammatory reaction due to other internal joint prosthesis, initial encounter: Secondary | ICD-10-CM | POA: Insufficient documentation

## 2021-09-25 DIAGNOSIS — X58XXXA Exposure to other specified factors, initial encounter: Secondary | ICD-10-CM | POA: Insufficient documentation

## 2021-09-25 DIAGNOSIS — I10 Essential (primary) hypertension: Secondary | ICD-10-CM | POA: Diagnosis not present

## 2021-09-25 DIAGNOSIS — Z472 Encounter for removal of internal fixation device: Secondary | ICD-10-CM | POA: Diagnosis not present

## 2021-09-25 DIAGNOSIS — G20 Parkinson's disease: Secondary | ICD-10-CM | POA: Diagnosis not present

## 2021-09-25 HISTORY — PX: HARDWARE REMOVAL: SHX979

## 2021-09-25 SURGERY — REMOVAL, HARDWARE
Anesthesia: General | Site: Ankle | Laterality: Right

## 2021-09-25 MED ORDER — LIDOCAINE HCL (CARDIAC) PF 100 MG/5ML IV SOSY
PREFILLED_SYRINGE | INTRAVENOUS | Status: DC | PRN
  Administered 2021-09-25: 40 mg via INTRAVENOUS

## 2021-09-25 MED ORDER — DEXAMETHASONE SODIUM PHOSPHATE 10 MG/ML IJ SOLN
INTRAMUSCULAR | Status: DC | PRN
  Administered 2021-09-25: 10 mg via INTRAVENOUS

## 2021-09-25 MED ORDER — SUCCINYLCHOLINE CHLORIDE 200 MG/10ML IV SOSY
PREFILLED_SYRINGE | INTRAVENOUS | Status: AC
  Filled 2021-09-25: qty 10

## 2021-09-25 MED ORDER — HYDROCODONE-ACETAMINOPHEN 5-325 MG PO TABS: 1.0000 | ORAL_TABLET | ORAL | 0 refills | Status: AC | PRN

## 2021-09-25 MED ORDER — PROPOFOL 10 MG/ML IV BOLUS
INTRAVENOUS | Status: AC
  Filled 2021-09-25: qty 20

## 2021-09-25 MED ORDER — ROCURONIUM 10MG/ML (10ML) SYRINGE FOR MEDFUSION PUMP - OPTIME
INTRAVENOUS | Status: DC | PRN
  Administered 2021-09-25: 30 mg via INTRAVENOUS

## 2021-09-25 MED ORDER — BUPIVACAINE HCL 0.5 % IJ SOLN
INTRAMUSCULAR | Status: DC | PRN
  Administered 2021-09-25: 30 mL

## 2021-09-25 MED ORDER — FENTANYL CITRATE (PF) 250 MCG/5ML IJ SOLN
INTRAMUSCULAR | Status: DC | PRN
  Administered 2021-09-25: 50 ug via INTRAVENOUS
  Administered 2021-09-25: 25 ug via INTRAVENOUS
  Administered 2021-09-25: 50 ug via INTRAVENOUS
  Administered 2021-09-25: 25 ug via INTRAVENOUS
  Administered 2021-09-25 (×2): 50 ug via INTRAVENOUS

## 2021-09-25 MED ORDER — CHLORHEXIDINE GLUCONATE 0.12 % MT SOLN
15.0000 mL | Freq: Once | OROMUCOSAL | Status: AC
  Administered 2021-09-25: 15 mL via OROMUCOSAL
  Filled 2021-09-25: qty 15

## 2021-09-25 MED ORDER — HYDROMORPHONE HCL 1 MG/ML IJ SOLN: 0.2500 mg | INTRAMUSCULAR | Status: DC | PRN

## 2021-09-25 MED ORDER — ONDANSETRON HCL 4 MG/2ML IJ SOLN
INTRAMUSCULAR | Status: AC
  Filled 2021-09-25: qty 2

## 2021-09-25 MED ORDER — FENTANYL CITRATE (PF) 100 MCG/2ML IJ SOLN
INTRAMUSCULAR | Status: AC
  Filled 2021-09-25: qty 2

## 2021-09-25 MED ORDER — DEXAMETHASONE SODIUM PHOSPHATE 10 MG/ML IJ SOLN
INTRAMUSCULAR | Status: AC
  Filled 2021-09-25: qty 1

## 2021-09-25 MED ORDER — ORAL CARE MOUTH RINSE: 15.0000 mL | Freq: Once | OROMUCOSAL | Status: AC

## 2021-09-25 MED ORDER — LACTATED RINGERS IV SOLN: INTRAVENOUS | Status: DC

## 2021-09-25 MED ORDER — CEPHALEXIN 250 MG PO CAPS: 250.0000 mg | ORAL_CAPSULE | Freq: Four times a day (QID) | ORAL | 0 refills | Status: AC

## 2021-09-25 MED ORDER — BUPIVACAINE HCL (PF) 0.5 % IJ SOLN
INTRAMUSCULAR | Status: AC
  Filled 2021-09-25: qty 30

## 2021-09-25 MED ORDER — SUGAMMADEX SODIUM 200 MG/2ML IV SOLN
INTRAVENOUS | Status: DC | PRN
  Administered 2021-09-25: 200 mg via INTRAVENOUS

## 2021-09-25 MED ORDER — LIDOCAINE HCL (PF) 2 % IJ SOLN
INTRAMUSCULAR | Status: AC
  Filled 2021-09-25: qty 5

## 2021-09-25 MED ORDER — 0.9 % SODIUM CHLORIDE (POUR BTL) OPTIME
TOPICAL | Status: DC | PRN
  Administered 2021-09-25: 1000 mL

## 2021-09-25 MED ORDER — ROCURONIUM BROMIDE 10 MG/ML (PF) SYRINGE
PREFILLED_SYRINGE | INTRAVENOUS | Status: AC
  Filled 2021-09-25: qty 10

## 2021-09-25 MED ORDER — PROPOFOL 10 MG/ML IV BOLUS
INTRAVENOUS | Status: DC | PRN
  Administered 2021-09-25: 120 mg via INTRAVENOUS

## 2021-09-25 MED ORDER — ONDANSETRON HCL 4 MG/2ML IJ SOLN: 4.0000 mg | Freq: Once | INTRAMUSCULAR | Status: DC | PRN

## 2021-09-25 MED ORDER — ONDANSETRON HCL 4 MG/2ML IJ SOLN
INTRAMUSCULAR | Status: DC | PRN
  Administered 2021-09-25: 4 mg via INTRAVENOUS

## 2021-09-25 MED ORDER — VANCOMYCIN HCL 1000 MG IV SOLR
INTRAVENOUS | Status: DC | PRN
  Administered 2021-09-25: 1000 mg

## 2021-09-25 MED ORDER — CEFAZOLIN SODIUM-DEXTROSE 2-4 GM/100ML-% IV SOLN
2.0000 g | INTRAVENOUS | Status: AC
  Administered 2021-09-25: 2 g via INTRAVENOUS
  Filled 2021-09-25: qty 100

## 2021-09-25 MED ORDER — SUCCINYLCHOLINE 20MG/ML (10ML) SYRINGE FOR MEDFUSION PUMP - OPTIME
INTRAMUSCULAR | Status: DC | PRN
  Administered 2021-09-25: 100 mg via INTRAVENOUS

## 2021-09-25 MED ORDER — ONDANSETRON HCL 4 MG PO TABS: 4.0000 mg | ORAL_TABLET | Freq: Three times a day (TID) | ORAL | 0 refills | Status: AC | PRN

## 2021-09-25 MED ORDER — VANCOMYCIN HCL 1000 MG IV SOLR
INTRAVENOUS | Status: AC
  Filled 2021-09-25: qty 20

## 2021-09-25 SURGICAL SUPPLY — 47 items
BANDAGE ESMARK 4X12 BL STRL LF (DISPOSABLE) ×1 IMPLANT
BLADE SURG 15 STRL LF DISP TIS (BLADE) ×2 IMPLANT
BLADE SURG 15 STRL SS (BLADE) ×4
BLADE SURG SZ10 CARB STEEL (BLADE) ×2 IMPLANT
BNDG CMPR 12X4 ELC STRL LF (DISPOSABLE) ×1
BNDG CMPR STD VLCR NS LF 5.8X6 (GAUZE/BANDAGES/DRESSINGS) ×1
BNDG COHESIVE 4X5 TAN STRL (GAUZE/BANDAGES/DRESSINGS) ×2 IMPLANT
BNDG ELASTIC 6X5.8 VLCR NS LF (GAUZE/BANDAGES/DRESSINGS) ×2 IMPLANT
BNDG ESMARK 4X12 BLUE STRL LF (DISPOSABLE) ×2
BNDG GAUZE ELAST 4 BULKY (GAUZE/BANDAGES/DRESSINGS) ×2 IMPLANT
CLOTH BEACON ORANGE TIMEOUT ST (SAFETY) ×2 IMPLANT
CNTNR URN SCR LID CUP LEK RST (MISCELLANEOUS) ×1 IMPLANT
CONT SPEC 4OZ STRL OR WHT (MISCELLANEOUS) ×2
COVER LIGHT HANDLE STERIS (MISCELLANEOUS) ×2 IMPLANT
CUFF TOURN SGL QUICK 34 (TOURNIQUET CUFF) ×2
CUFF TRNQT CYL 34X4.125X (TOURNIQUET CUFF) ×1 IMPLANT
DRAPE C-ARM FOLDED MOBILE STRL (DRAPES) ×2 IMPLANT
DRAPE U-SHAPE 47X51 STRL (DRAPES) ×2 IMPLANT
DRSG XEROFORM 1X8 (GAUZE/BANDAGES/DRESSINGS) ×2 IMPLANT
ELECT REM PT RETURN 9FT ADLT (ELECTROSURGICAL) ×2
ELECTRODE REM PT RTRN 9FT ADLT (ELECTROSURGICAL) ×1 IMPLANT
GAUZE SPONGE 4X4 12PLY STRL (GAUZE/BANDAGES/DRESSINGS) ×4 IMPLANT
GLOVE SURG POLYISO LF SZ7 (GLOVE) ×2 IMPLANT
GOWN STRL REUS W/TWL LRG LVL3 (GOWN DISPOSABLE) ×2 IMPLANT
GOWN STRL REUS W/TWL XL LVL3 (GOWN DISPOSABLE) ×2 IMPLANT
INST SET MINOR BONE (KITS) ×2 IMPLANT
KIT TURNOVER KIT A (KITS) ×2 IMPLANT
MANIFOLD NEPTUNE II (INSTRUMENTS) ×2 IMPLANT
NEEDLE HYPO 21X1.5 SAFETY (NEEDLE) ×2 IMPLANT
NS IRRIG 1000ML POUR BTL (IV SOLUTION) ×6 IMPLANT
PACK BASIC LIMB (CUSTOM PROCEDURE TRAY) ×2 IMPLANT
PAD ABD 5X9 TENDERSORB (GAUZE/BANDAGES/DRESSINGS) ×2 IMPLANT
PAD ARMBOARD 7.5X6 YLW CONV (MISCELLANEOUS) ×4 IMPLANT
PAD CAST 4YDX4 CTTN HI CHSV (CAST SUPPLIES) ×1 IMPLANT
PAD TELFA 3X4 1S STER (GAUZE/BANDAGES/DRESSINGS) ×2 IMPLANT
PADDING CAST COTTON 4X4 STRL (CAST SUPPLIES) ×2
PADDING WEBRIL 4 STERILE (GAUZE/BANDAGES/DRESSINGS) ×2 IMPLANT
SET BASIN LINEN APH (SET/KITS/TRAYS/PACK) ×2 IMPLANT
SOL PREP PROV IODINE SCRUB 4OZ (MISCELLANEOUS) ×2 IMPLANT
SPONGE T-LAP 18X18 ~~LOC~~+RFID (SPONGE) ×4 IMPLANT
SUT ETHILON 3 0 FSL (SUTURE) ×2 IMPLANT
SUT MNCRL AB 4-0 PS2 18 (SUTURE) ×2 IMPLANT
SUT MON AB 2-0 SH 27 (SUTURE) ×2
SUT MON AB 2-0 SH27 (SUTURE) ×1 IMPLANT
SWAB CULTURE ESWAB REG 1ML (MISCELLANEOUS) ×2 IMPLANT
SYR 30ML LL (SYRINGE) ×2 IMPLANT
SYR BULB IRRIG 60ML STRL (SYRINGE) ×2 IMPLANT

## 2021-09-25 NOTE — Discharge Instructions (Signed)
Brinda Focht A. Amedeo Kinsman, MD [facility] [address] [employer],  [place]  [address] Phone: [phone] Fax: [phone]   [place] Please keep dressing clean dry and intact until followup.  You may shower on Post-Op Day #2.  You must keep dressing dry during this process and may find that a plastic bag taped around the leg or alternatively a towel based bath may be a better option.   If you get your splint wet or if it is damaged please contact our clinic.  EXERCISES Please use crutches or a walker to assist with weight bearing.   REGIONAL ANESTHESIA (NERVE BLOCKS) The anesthesia team may have performed a nerve block for you if safe in the setting of your care.  This is a great tool used to minimize pain.  Typically the block may start wearing off overnight but the long acting medicine may last for 3-4 days.  The nerve block wearing off can be a challenging period but please utilize your as needed pain medications to try and manage this period.    POST-OP MEDICATIONS- Multimodal approach to pain control  In general your pain will be controlled with a combination of substances.  Prescriptions unless otherwise discussed are electronically sent to your pharmacy.  This is a carefully made plan we use to minimize narcotic use.     - Hydrocodone - This is a strong narcotic, to be used only on an "as needed" basis for pain.  -  Zofran - take as needed for nausea   -  Keflex - antibiotic, to be taken to completion  FOLLOW-UP If you develop a Fever (>101.5), Redness or Drainage from the surgical incision site, please call our office to arrange for an evaluation. Please call the office to schedule a follow-up appointment for your incision check if you do not already have one, 10-14 days post-operatively.  IF YOU HAVE ANY QUESTIONS, PLEASE FEEL FREE TO CALL OUR OFFICE.  HELPFUL INFORMATION  If you had a block, it will  wear off between 8-24 hrs postop typically.  This is period when your pain may go from nearly zero to the pain you would have had postop without the block.  This is an abrupt transition but nothing dangerous is happening.  You may take an extra dose of narcotic when this happens.  You should wean off your narcotic medicines as soon as you are able.  Most patients will be off or using minimal narcotics before their first postop appointment.   Elevating your leg will help with swelling and pain control.  You are encouraged to elevate your leg as much as possible in the first couple of weeks following surgery.  Imagine a drop of water on your toe, and your goal is to get that water back to your heart.  We suggest you use the pain medication the first night prior to going to bed, in order to ease any pain when the anesthesia wears off. You should avoid taking pain medications on an empty stomach as it will make you nauseous.  Do not drink alcoholic beverages or take illicit drugs when taking pain medications.  In most states it is against the law to drive while you are in a splint or sling.  And certainly against the law to drive while taking narcotics.  You may return to work/school in the next couple of days when you feel up to it.   Pain  medication may make you constipated.  Below are a few solutions to try in this order: Decrease the amount of pain medication if you aren't having pain. Drink lots of decaffeinated fluids. Drink prune juice and/or each dried prunes  If the first 3 don't work start with additional solutions Take Colace - an over-the-counter stool softener Take Senokot - an over-the-counter laxative Take Miralax - a stronger over-the-counter laxative

## 2021-09-25 NOTE — Anesthesia Procedure Notes (Signed)
Procedure Name: Intubation Date/Time: 09/25/2021 7:36 AM Performed by: Karna Dupes, CRNA Pre-anesthesia Checklist: Patient identified, Emergency Drugs available, Suction available and Patient being monitored Patient Re-evaluated:Patient Re-evaluated prior to induction Oxygen Delivery Method: Circle system utilized Preoxygenation: Pre-oxygenation with 100% oxygen Induction Type: IV induction Ventilation: Mask ventilation without difficulty Laryngoscope Size: Mac and 3 Grade View: Grade I Tube type: Oral Tube size: 7.0 mm Number of attempts: 1 Airway Equipment and Method: Stylet Placement Confirmation: ETT inserted through vocal cords under direct vision, positive ETCO2 and breath sounds checked- equal and bilateral Secured at: 21 cm Tube secured with: Tape Dental Injury: Teeth and Oropharynx as per pre-operative assessment

## 2021-09-25 NOTE — Op Note (Addendum)
Orthopaedic Surgery Operative Note (CSN: [id number])  Tonya Perez  July 10, 1946 Date of Surgery: 09/25/2021   Diagnoses:  Infected hardware, right ankle  Procedure: Removal of hardware from right ankle   Operative Finding Successful completion of the planned procedure.  Lateral plate and screws, and 2 medial screws were removed without any issues.  Purulent material was sent for culture from the lateral ankle.    Post-Op Diagnosis: Same Surgeons:Primary: [name], MD Assistants: None Location: [facility] OR ROOM 4 Anesthesia: General with local anesthesia Antibiotics: Ancef 2 g with local vancomycin powder 1 g at the surgical site Tourniquet time:  Total Tourniquet Time Documented: Thigh (Right) - 62 minutes Total: Thigh (Right) - 62 minutes  Estimated Blood Loss: Minimal Complications: None Specimens: Right lateral ankle tissue and purulent material Implants: Removal of plate and 8 screws from the lateral ankle. Removal of 2 cannulated screws medially  Indications for Surgery:   Tonya Perez is a 75 y.o. female who sustained a right ankle fracture, and underwent operative fixation, approximately 1 year ago.  She has healed her fracture, but has had a chronically draining wound laterally.  She previously underwent formal irrigation and debridement, without complete resolution of her symptoms.  She continues to have drainage.  At this time, there are 2 chronically draining wounds over the lateral ankle.  Benefits and risks of operative and nonoperative management were discussed prior to surgery with the patient's guardian and informed consent form was completed.  Specific risks including infection, need for additional surgery, recurrence of the infection, recurrent fracture, nonunion, malunion, bleeding and more severe complications associated with anesthesia.  The patient has been elected to proceed with surgery.  Surgical consent was finalized.   Procedure:   The patient was  identified properly. Informed consent was obtained and the surgical site was marked. The patient was taken up to suite where general anesthesia was induced.  The patient was positioned lateral, with the right ankle up.  The right leg was prepped and draped in the usual sterile fashion.  Timeout was performed before the beginning of the case.  Tourniquet was used for the above duration.  She received 2 g of Ancef prior to making an incision.  We started on the lateral ankle.  The previous incision was identified.  This was outlined.  At the midpoint of the incision, there was a small chronically draining wound.  No purulence was appreciated.  Just distal to this, there was a scab, which was previously draining.  Both of these areas were ellipsed out.  Once we had dissected through the subcutaneous tissue, there was purulent material surrounding these draining areas.  This was collected and sent for culture.  We then dissected sharply down to the lateral aspect of the plate.  All screws were exposed using electrocautery.  The screws were sequentially removed, without issues.  There were no broken screws.  The plate was easily removed.  We then used a Cobb elevator to scrape the lateral aspect of the fibula.  Some of this contained purulent material.  This was also sent for culture.  We then used a curette to remove some existing scar tissue material from the screws,'s especially around the draining wounds.  We then proceeded to irrigate the lateral aspect of the ankle with more than 2 L of normal saline.  We placed vancomycin powder on the lateral aspect of the fibula, and tried to push this into the existing screw holes.  This incision was then  closed in a layered fashion with 2-0 Monocryl, followed by 3-0 nylon.  We then turned our attention to the medial ankle.  While still in the lateral position, the hip and ankle were rotated to access the medial incision.  The screw heads were palpable.  We used a very  small portion of the incision, less than 1 cm, and incised sharply through skin.  The cannulated screw was identified.  Used electrocautery to expose the head of the screw.  We then placed the K wire through the head of the screw, and we were able to remove this with a cannulated screwdriver.  The same procedure was repeated for the more anterior screw, and this was also removed without issues.  We then proceeded to take orthogonal views on fluoroscopy, to confirm full removal of all hardware.  The medial incision was then irrigated, and vancomycin powder was placed within the wound.  This was closed with a single horizontal mattress suture using 3-0 nylon.  A sterile dressing was placed with Xeroform, gauze, ABD pad, cast padding and an Ace wrapped.  The patient was awoken taken to PACU in stable condition.   Post-operative plan:  The patient will be weightbearing as tolerated on the right lower extremity..   DVT prophylaxis Aspirin 81 mg twice daily for 6 weeks.    Pain control with PRN pain medication preferring oral medicines.   Follow up plan will be scheduled in approximately 10 days for incision check and XR.

## 2021-09-25 NOTE — Anesthesia Postprocedure Evaluation (Signed)
Anesthesia Post Note  Patient: Tonya Perez  Procedure(s) Performed: HARDWARE REMOVAL (Right: Ankle)  Patient location during evaluation: PACU Anesthesia Type: General Level of consciousness: awake and alert and oriented Pain management: pain level controlled Vital Signs Assessment: post-procedure vital signs reviewed and stable Respiratory status: spontaneous breathing, nonlabored ventilation and respiratory function stable Cardiovascular status: blood pressure returned to baseline and stable Postop Assessment: no apparent nausea or vomiting Anesthetic complications: no   No notable events documented.   Last Vitals:  Vitals:   09/25/21 1000 09/25/21 1043  BP: 118/73 95/79  Pulse: 75 87  Resp: 11 12  Temp:  (!) 36.2 C  SpO2: 97% 95%    Last Pain:  Vitals:   09/25/21 1043  TempSrc: Oral  PainSc: 0-No pain                 Indica Marcott C Jyoti Harju

## 2021-09-25 NOTE — Interval H&P Note (Signed)
History and Physical Interval Note:  09/25/2021 7:15 AM  Tonya Perez  has presented today for surgery, with the diagnosis of Infected hardware, right ankle.  The various methods of treatment have been discussed with the patient and family. After consideration of risks, benefits and other options for treatment, the patient has consented to  Procedure(s) with comments: HARDWARE REMOVAL (Right) - Hardware removal from right ankle as a surgical intervention.  The patient's history has been reviewed, patient examined, no change in status, stable for surgery.  I have reviewed the patient's chart and labs.  Questions were answered to the patient's satisfaction.     [name]

## 2021-09-25 NOTE — Transfer of Care (Signed)
Immediate Anesthesia Transfer of Care Note  Patient: Tonya Perez  Procedure(s) Performed: HARDWARE REMOVAL (Right: Ankle)  Patient Location: PACU  Anesthesia Type:General  Level of Consciousness: awake  Airway & Oxygen Therapy: Patient Spontanous Breathing and Patient connected to nasal cannula oxygen  Post-op Assessment: Report given to RN and Post -op Vital signs reviewed and stable  Post vital signs: Reviewed and stable  Last Vitals:  Vitals Value Taken Time  BP 143/82 09/25/21 0917  Temp    Pulse 85 09/25/21 0917  Resp 11 09/25/21 0917  SpO2 95 % 09/25/21 0917  Vitals shown include unvalidated device data.  Last Pain:  Vitals:   09/25/21 0653  TempSrc: Oral  PainSc: 0-No pain         Complications: No notable events documented.

## 2021-09-25 NOTE — Anesthesia Preprocedure Evaluation (Addendum)
Anesthesia Evaluation  Patient identified by MRN, date of birth, ID band Patient awake    Reviewed: Allergy & Precautions, NPO status , Patient's Chart, lab work & pertinent test results  History of Anesthesia Complications Negative for: history of anesthetic complications  Airway Mallampati: III  TM Distance: >3 FB Neck ROM: Full    Dental  (+) Dental Advisory Given, Missing, Chipped, Poor Dentition   Pulmonary neg pulmonary ROS,    Pulmonary exam normal breath sounds clear to auscultation       Cardiovascular Exercise Tolerance: Poor hypertension, Pt. on medications Normal cardiovascular exam Rhythm:Regular Rate:Normal  21-Sep-2021 08:29:42 [facility]300 ROUTINE RECORD 1946-08-27 (89 yr) Female Unknown Vent. rate 96 BPM PR interval 198 ms QRS duration 82 ms QT/QTcB 368/464 ms P-R-T axes 91 59 10 Sinus rhythm with Premature supraventricular complexes Low voltage QRS Non-specific ST-t changes Abnormal ECG Confirmed by Asencion Noble [phone]) on 09/22/2021 5:50:54 PM   Neuro/Psych PSYCHIATRIC DISORDERS Anxiety Depression  Neuromuscular disease (Parkinsonism)    GI/Hepatic negative GI ROS, Neg liver ROS,   Endo/Other  negative endocrine ROS  Renal/GU Renal InsufficiencyRenal disease (AKI)     Musculoskeletal  (+) Arthritis , Osteoarthritis,    Abdominal   Peds  Hematology negative hematology ROS (+)   Anesthesia Other Findings frequent falls   Reproductive/Obstetrics negative OB ROS                           Anesthesia Physical  Anesthesia Plan  ASA: 3  Anesthesia Plan: General   Post-op Pain Management:  Regional for Post-op pain   Induction: Intravenous  PONV Risk Score and Plan: 4 or greater and Ondansetron and Dexamethasone  Airway Management Planned: Oral ETT  Additional Equipment:   Intra-op Plan:   Post-operative Plan: Extubation in OR  Informed  Consent: I have reviewed the patients History and Physical, chart, labs and discussed the procedure including the risks, benefits and alternatives for the proposed anesthesia with the patient or authorized representative who has indicated his/her understanding and acceptance.     Dental advisory given  Plan Discussed with: CRNA and Surgeon  Anesthesia Plan Comments:        Anesthesia Quick Evaluation

## 2021-09-26 ENCOUNTER — Encounter (HOSPITAL_COMMUNITY): Payer: Self-pay | Admitting: Orthopedic Surgery

## 2021-09-28 DIAGNOSIS — Z20828 Contact with and (suspected) exposure to other viral communicable diseases: Secondary | ICD-10-CM | POA: Diagnosis not present

## 2021-09-28 DIAGNOSIS — Z1159 Encounter for screening for other viral diseases: Secondary | ICD-10-CM | POA: Diagnosis not present

## 2021-09-30 LAB — AEROBIC/ANAEROBIC CULTURE W GRAM STAIN (SURGICAL/DEEP WOUND): Culture: NORMAL

## 2021-10-06 ENCOUNTER — Ambulatory Visit (HOSPITAL_COMMUNITY): Payer: Medicare Other

## 2021-10-06 ENCOUNTER — Encounter (HOSPITAL_COMMUNITY): Payer: Self-pay

## 2021-10-07 DIAGNOSIS — Z1159 Encounter for screening for other viral diseases: Secondary | ICD-10-CM | POA: Diagnosis not present

## 2021-10-07 DIAGNOSIS — Z20828 Contact with and (suspected) exposure to other viral communicable diseases: Secondary | ICD-10-CM | POA: Diagnosis not present

## 2021-10-10 ENCOUNTER — Encounter: Payer: Self-pay | Admitting: Orthopedic Surgery

## 2021-10-10 ENCOUNTER — Other Ambulatory Visit: Payer: Self-pay

## 2021-10-10 ENCOUNTER — Ambulatory Visit (INDEPENDENT_AMBULATORY_CARE_PROVIDER_SITE_OTHER): Payer: Medicare Other | Admitting: Orthopedic Surgery

## 2021-10-10 ENCOUNTER — Ambulatory Visit: Payer: Medicare Other

## 2021-10-10 DIAGNOSIS — Z85820 Personal history of malignant melanoma of skin: Secondary | ICD-10-CM | POA: Insufficient documentation

## 2021-10-10 DIAGNOSIS — S82401G Unspecified fracture of shaft of right fibula, subsequent encounter for closed fracture with delayed healing: Secondary | ICD-10-CM

## 2021-10-10 DIAGNOSIS — S82201G Unspecified fracture of shaft of right tibia, subsequent encounter for closed fracture with delayed healing: Secondary | ICD-10-CM

## 2021-10-10 DIAGNOSIS — T847XXS Infection and inflammatory reaction due to other internal orthopedic prosthetic devices, implants and grafts, sequela: Secondary | ICD-10-CM

## 2021-10-10 DIAGNOSIS — K589 Irritable bowel syndrome without diarrhea: Secondary | ICD-10-CM | POA: Insufficient documentation

## 2021-10-10 NOTE — Patient Instructions (Signed)
OK to shower.  Let soap and water run over the incisions.  Do not scrub, pat the incision dry   Call if you need anything else.

## 2021-10-11 NOTE — Progress Notes (Signed)
Orthopaedic Postop Note  Assessment: Tonya Perez is a 75 y.o. female s/p ORIF of Right ankle fracture (63/84/6659), complicated by wound drainage and breakdown requiring I&D, removal of screw and revision closure (01/19/2021); recurrent postoperative infection  Removal of hardware 09/25/2021  Plan: No issues since surgery.  Sutures were removed, and Steri-Strips were placed.  Overall, her foot and ankle looks much better.  The swelling has improved.  There is no drainage.  There is no further discoloration.  X-rays demonstrate no acute injury.  All hardware has been removed without issue.  She should continue with her current antibiotics, and complete this prescription.  Continue to work on activities as tolerated.  Weightbearing as tolerated.  No further follow-up is needed, this can have issues.  This was discussed with the patient's husband, who is her caregiver, and he is in agreement with this plan.  Follow-up as needed.  Follow-up: Return if symptoms worsen or fail to improve.   XR at next visit: XR R ankle  Subjective:  Chief Complaint  Patient presents with   Post-op Follow-up    Ankle ORIF right 09/25/21 improving     History of Present Illness: Tonya Perez is a 75 y.o. female who presents following the above stated procedure.  Surgery was approximately 2 weeks ago.  She has done well.  Her pain is controlled.  No fevers or chills.  They have not noticed any drainage in the ankle.  She continues to take antibiotics, without issues.  Review of Systems: No fevers or chills No numbness or tingling No Chest Pain No shortness of breath   Objective: There were no vitals taken for this visit.  Physical Exam:  Medial lateral ankle incisions are healing well.  No surrounding erythema or drainage.  No tenderness to palpation.  There is no swelling about the ankle or the foot.  There is no persistent discoloration about the foot.  Active motion is intact in the EHL/TA.  Sensation  is intact over the dorsum of the foot.  IMAGING: I personally ordered and reviewed the following images  X-ray of the right ankle was obtained in clinic today.  There is been interval removal of all hardware.  No acute injuries are noted.  Mortise remains intact.  No syndesmotic disruption.  Screw holes from previous hardware are identified, without adverse features.  Impression: Right ankle x-ray following removal of hardware, without acute injury.  Tonya Rasmussen, MD 10/11/2021 8:11 AM

## 2021-10-19 DIAGNOSIS — Z1159 Encounter for screening for other viral diseases: Secondary | ICD-10-CM | POA: Diagnosis not present

## 2021-10-19 DIAGNOSIS — Z20828 Contact with and (suspected) exposure to other viral communicable diseases: Secondary | ICD-10-CM | POA: Diagnosis not present

## 2021-10-26 DIAGNOSIS — Z20828 Contact with and (suspected) exposure to other viral communicable diseases: Secondary | ICD-10-CM | POA: Diagnosis not present

## 2021-10-26 DIAGNOSIS — Z1159 Encounter for screening for other viral diseases: Secondary | ICD-10-CM | POA: Diagnosis not present

## 2021-11-04 DIAGNOSIS — Z20828 Contact with and (suspected) exposure to other viral communicable diseases: Secondary | ICD-10-CM | POA: Diagnosis not present

## 2021-11-04 DIAGNOSIS — Z1159 Encounter for screening for other viral diseases: Secondary | ICD-10-CM | POA: Diagnosis not present

## 2021-11-11 DIAGNOSIS — Z20828 Contact with and (suspected) exposure to other viral communicable diseases: Secondary | ICD-10-CM | POA: Diagnosis not present

## 2021-11-11 DIAGNOSIS — Z1159 Encounter for screening for other viral diseases: Secondary | ICD-10-CM | POA: Diagnosis not present

## 2021-11-14 DIAGNOSIS — Z515 Encounter for palliative care: Secondary | ICD-10-CM | POA: Diagnosis not present

## 2021-11-14 DIAGNOSIS — G20 Parkinson's disease: Secondary | ICD-10-CM | POA: Diagnosis not present

## 2021-11-14 DIAGNOSIS — F02B2 Dementia in other diseases classified elsewhere, moderate, with psychotic disturbance: Secondary | ICD-10-CM | POA: Diagnosis not present

## 2021-11-19 DIAGNOSIS — Z1159 Encounter for screening for other viral diseases: Secondary | ICD-10-CM | POA: Diagnosis not present

## 2021-11-19 DIAGNOSIS — Z20828 Contact with and (suspected) exposure to other viral communicable diseases: Secondary | ICD-10-CM | POA: Diagnosis not present

## 2021-11-27 DIAGNOSIS — Z1159 Encounter for screening for other viral diseases: Secondary | ICD-10-CM | POA: Diagnosis not present

## 2021-11-27 DIAGNOSIS — Z20828 Contact with and (suspected) exposure to other viral communicable diseases: Secondary | ICD-10-CM | POA: Diagnosis not present

## 2021-12-06 DIAGNOSIS — Z1159 Encounter for screening for other viral diseases: Secondary | ICD-10-CM | POA: Diagnosis not present

## 2021-12-06 DIAGNOSIS — Z20828 Contact with and (suspected) exposure to other viral communicable diseases: Secondary | ICD-10-CM | POA: Diagnosis not present

## 2021-12-11 DIAGNOSIS — Z515 Encounter for palliative care: Secondary | ICD-10-CM | POA: Diagnosis not present

## 2021-12-11 DIAGNOSIS — G20 Parkinson's disease: Secondary | ICD-10-CM | POA: Diagnosis not present

## 2021-12-11 DIAGNOSIS — F02B2 Dementia in other diseases classified elsewhere, moderate, with psychotic disturbance: Secondary | ICD-10-CM | POA: Diagnosis not present

## 2021-12-17 DIAGNOSIS — Z20828 Contact with and (suspected) exposure to other viral communicable diseases: Secondary | ICD-10-CM | POA: Diagnosis not present

## 2021-12-17 DIAGNOSIS — Z1159 Encounter for screening for other viral diseases: Secondary | ICD-10-CM | POA: Diagnosis not present

## 2021-12-25 DIAGNOSIS — Z20828 Contact with and (suspected) exposure to other viral communicable diseases: Secondary | ICD-10-CM | POA: Diagnosis not present

## 2021-12-25 DIAGNOSIS — Z1159 Encounter for screening for other viral diseases: Secondary | ICD-10-CM | POA: Diagnosis not present

## 2021-12-27 DIAGNOSIS — Z20822 Contact with and (suspected) exposure to covid-19: Secondary | ICD-10-CM | POA: Diagnosis not present

## 2022-01-03 DIAGNOSIS — Z1159 Encounter for screening for other viral diseases: Secondary | ICD-10-CM | POA: Diagnosis not present

## 2022-01-03 DIAGNOSIS — Z20828 Contact with and (suspected) exposure to other viral communicable diseases: Secondary | ICD-10-CM | POA: Diagnosis not present

## 2022-01-08 DIAGNOSIS — Z515 Encounter for palliative care: Secondary | ICD-10-CM | POA: Diagnosis not present

## 2022-01-08 DIAGNOSIS — G20 Parkinson's disease: Secondary | ICD-10-CM | POA: Diagnosis not present

## 2022-01-08 DIAGNOSIS — F02B2 Dementia in other diseases classified elsewhere, moderate, with psychotic disturbance: Secondary | ICD-10-CM | POA: Diagnosis not present

## 2022-01-24 DIAGNOSIS — Z20828 Contact with and (suspected) exposure to other viral communicable diseases: Secondary | ICD-10-CM | POA: Diagnosis not present

## 2022-01-24 DIAGNOSIS — Z1159 Encounter for screening for other viral diseases: Secondary | ICD-10-CM | POA: Diagnosis not present

## 2022-01-30 DIAGNOSIS — Z20822 Contact with and (suspected) exposure to covid-19: Secondary | ICD-10-CM | POA: Diagnosis not present

## 2022-02-04 DIAGNOSIS — Z1159 Encounter for screening for other viral diseases: Secondary | ICD-10-CM | POA: Diagnosis not present

## 2022-02-04 DIAGNOSIS — Z20828 Contact with and (suspected) exposure to other viral communicable diseases: Secondary | ICD-10-CM | POA: Diagnosis not present

## 2022-02-08 DIAGNOSIS — Z515 Encounter for palliative care: Secondary | ICD-10-CM | POA: Diagnosis not present

## 2022-02-08 DIAGNOSIS — F02B2 Dementia in other diseases classified elsewhere, moderate, with psychotic disturbance: Secondary | ICD-10-CM | POA: Diagnosis not present

## 2022-02-08 DIAGNOSIS — G20 Parkinson's disease: Secondary | ICD-10-CM | POA: Diagnosis not present

## 2022-02-13 DIAGNOSIS — Z1159 Encounter for screening for other viral diseases: Secondary | ICD-10-CM | POA: Diagnosis not present

## 2022-02-13 DIAGNOSIS — Z20828 Contact with and (suspected) exposure to other viral communicable diseases: Secondary | ICD-10-CM | POA: Diagnosis not present

## 2022-02-15 DIAGNOSIS — Z20822 Contact with and (suspected) exposure to covid-19: Secondary | ICD-10-CM | POA: Diagnosis not present

## 2022-02-19 DIAGNOSIS — Z1159 Encounter for screening for other viral diseases: Secondary | ICD-10-CM | POA: Diagnosis not present

## 2022-02-19 DIAGNOSIS — Z20828 Contact with and (suspected) exposure to other viral communicable diseases: Secondary | ICD-10-CM | POA: Diagnosis not present

## 2022-02-27 DIAGNOSIS — Z20822 Contact with and (suspected) exposure to covid-19: Secondary | ICD-10-CM | POA: Diagnosis not present

## 2022-02-27 DIAGNOSIS — Z23 Encounter for immunization: Secondary | ICD-10-CM | POA: Diagnosis not present

## 2022-02-28 DIAGNOSIS — Z20828 Contact with and (suspected) exposure to other viral communicable diseases: Secondary | ICD-10-CM | POA: Diagnosis not present

## 2022-02-28 DIAGNOSIS — Z1159 Encounter for screening for other viral diseases: Secondary | ICD-10-CM | POA: Diagnosis not present

## 2022-03-22 DIAGNOSIS — Z1159 Encounter for screening for other viral diseases: Secondary | ICD-10-CM | POA: Diagnosis not present

## 2022-03-22 DIAGNOSIS — Z20828 Contact with and (suspected) exposure to other viral communicable diseases: Secondary | ICD-10-CM | POA: Diagnosis not present

## 2022-04-04 IMAGING — CT CT HEAD W/O CM
2 of 4 series · 11 of 47 positions shown, 13 images · non-contrast
Comparison: None.

CLINICAL DATA: New onset altered mental status.  Unable to walk.

EXAM:
CT HEAD WITHOUT CONTRAST
TECHNIQUE: Contiguous axial images were obtained from the base of the skull
through the vertex without intravenous contrast.

[Series 4: coronal soft · coronal · 0.31mm/px · 3 of 84 slices shown]
[im 28/84  brain]
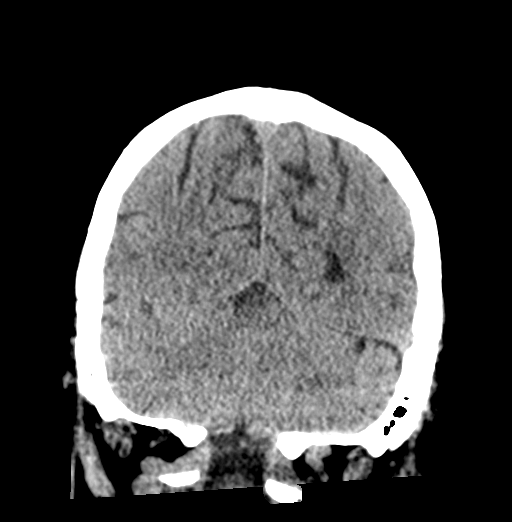
[im 37/84  brain]
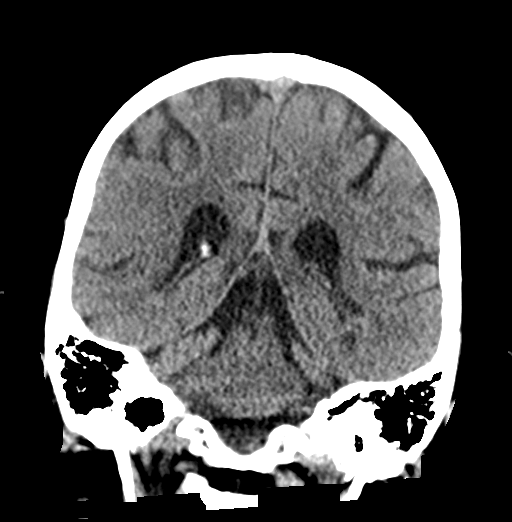
[im 47/84  brain]
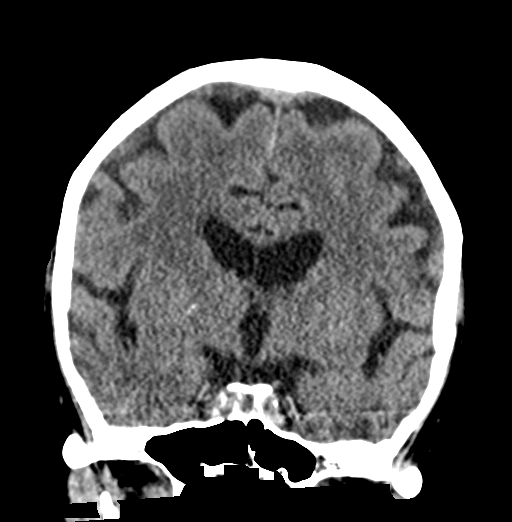

[Series 6: head ax w o · axial · 0.30mm/px · z∈[+40,+178]mm · 8 of 34 slices shown, 10 images]
[im 3/34  brain]
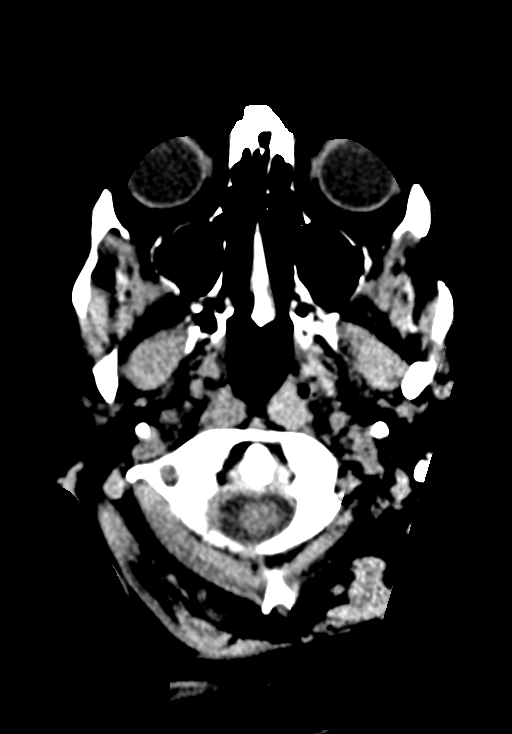
[im 3/34  bone]
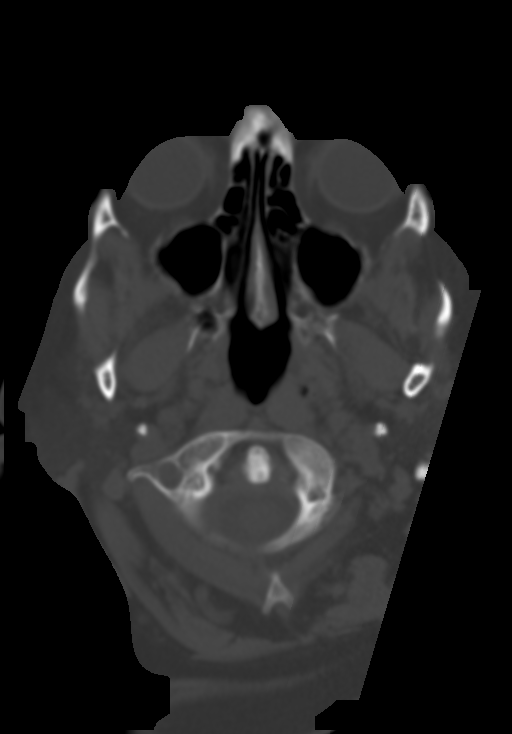
[im 7/34  brain]
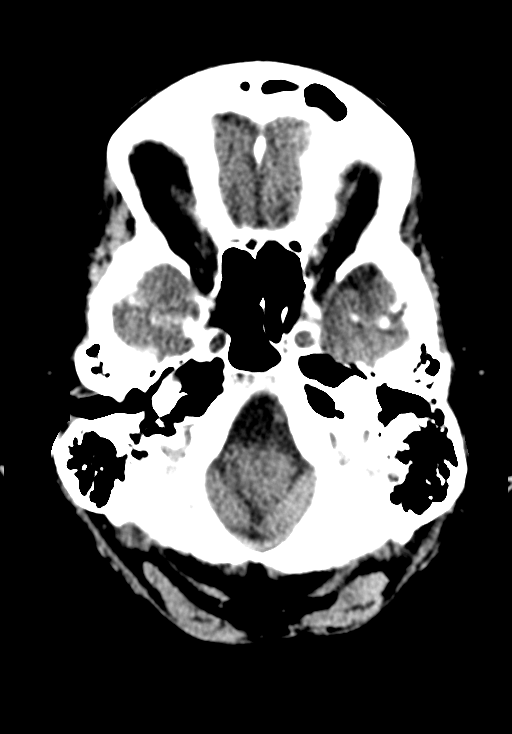
[im 12/34  brain]
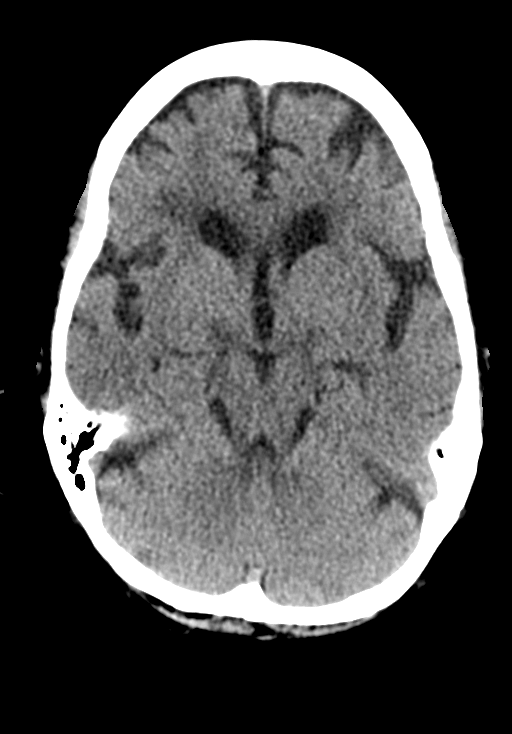
[im 16/34  brain]
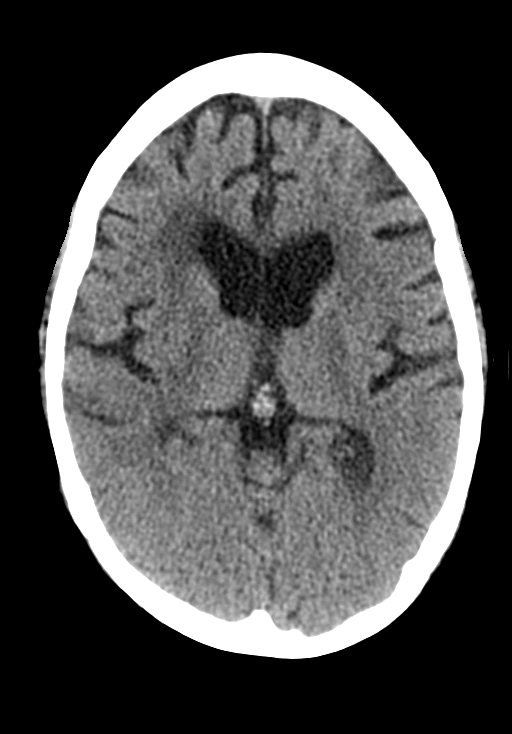
[im 18/34  brain]
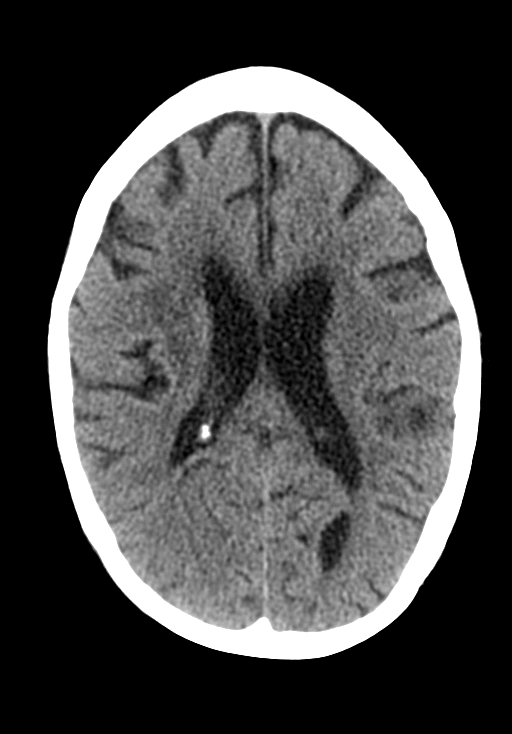
[im 18/34  bone]
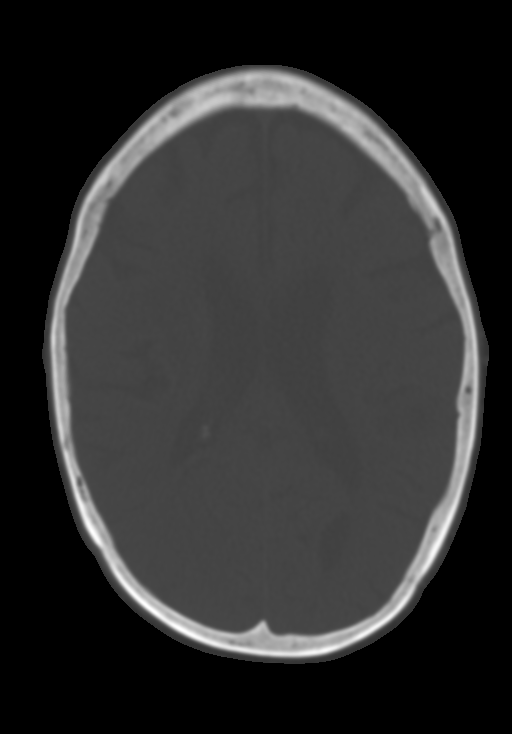
[im 23/34  brain]
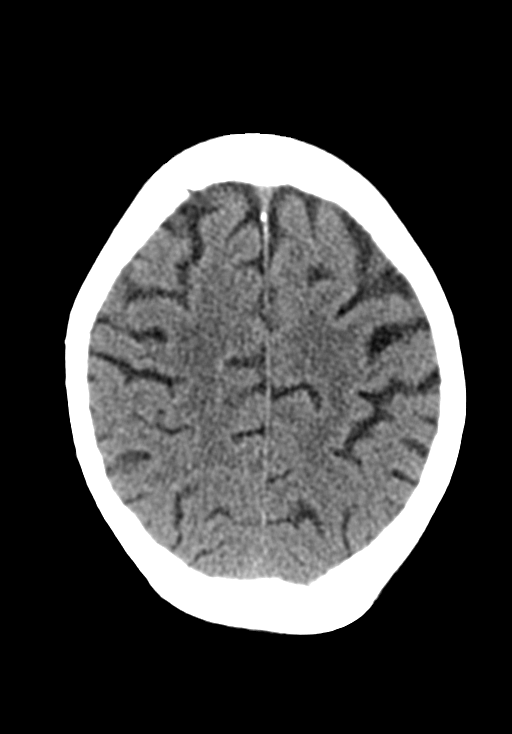
[im 27/34  brain]
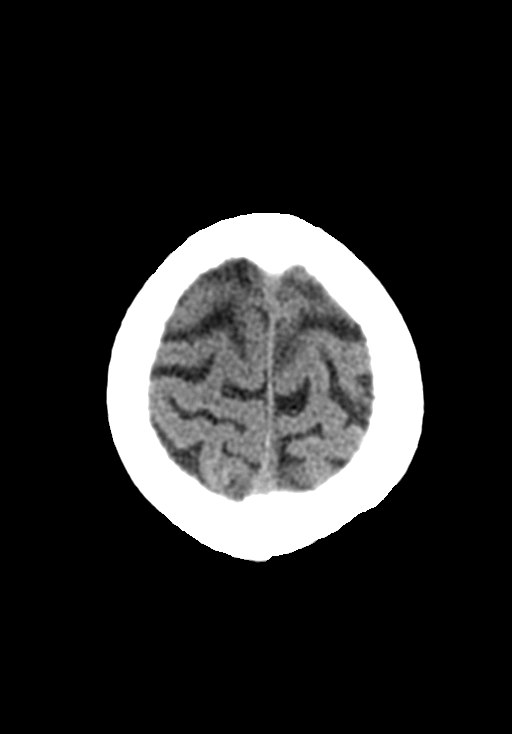
[im 31/34  brain]
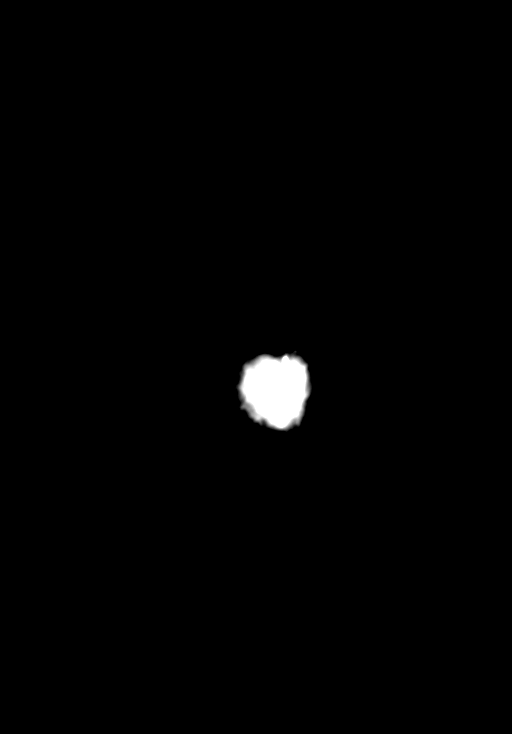

[11 of 47 positions shown; findings below may reference images not displayed]

FINDINGS: Brain: Moderate periventricular white matter disease is again noted,
asymmetric on the right. No acute infarct, hemorrhage, or mass
lesion is present. Moderate generalized atrophy is present. The
ventricles are proportionate to the degree of atrophy. No
significant extraaxial fluid collection is present.

Vascular: Atherosclerotic calcifications are present within the
cavernous internal carotid arteries. No hyperdense vessel is
present.

Skull: Calvarium is intact. No focal lytic or blastic lesions are
present. No significant extracranial soft tissue lesion is present.

Sinuses/Orbits: The paranasal sinuses and mastoid air cells are
clear. Bilateral lens replacements are noted. Globes and orbits are
otherwise unremarkable.
IMPRESSION: 1. No acute intracranial abnormality or significant interval change.
2. Stable moderate generalized atrophy and white matter disease.
This likely reflects the sequela of chronic microvascular ischemia.

## 2022-04-05 DIAGNOSIS — Z1159 Encounter for screening for other viral diseases: Secondary | ICD-10-CM | POA: Diagnosis not present

## 2022-04-05 DIAGNOSIS — Z20828 Contact with and (suspected) exposure to other viral communicable diseases: Secondary | ICD-10-CM | POA: Diagnosis not present

## 2022-04-10 DIAGNOSIS — Z515 Encounter for palliative care: Secondary | ICD-10-CM | POA: Diagnosis not present

## 2022-04-10 DIAGNOSIS — G20 Parkinson's disease: Secondary | ICD-10-CM | POA: Diagnosis not present

## 2022-04-10 DIAGNOSIS — F02B2 Dementia in other diseases classified elsewhere, moderate, with psychotic disturbance: Secondary | ICD-10-CM | POA: Diagnosis not present

## 2022-04-18 DIAGNOSIS — Z1159 Encounter for screening for other viral diseases: Secondary | ICD-10-CM | POA: Diagnosis not present

## 2022-04-18 DIAGNOSIS — Z20828 Contact with and (suspected) exposure to other viral communicable diseases: Secondary | ICD-10-CM | POA: Diagnosis not present

## 2022-04-24 DIAGNOSIS — Z1159 Encounter for screening for other viral diseases: Secondary | ICD-10-CM | POA: Diagnosis not present

## 2022-04-24 DIAGNOSIS — Z20828 Contact with and (suspected) exposure to other viral communicable diseases: Secondary | ICD-10-CM | POA: Diagnosis not present

## 2022-05-07 DIAGNOSIS — Z1159 Encounter for screening for other viral diseases: Secondary | ICD-10-CM | POA: Diagnosis not present

## 2022-05-07 DIAGNOSIS — Z20828 Contact with and (suspected) exposure to other viral communicable diseases: Secondary | ICD-10-CM | POA: Diagnosis not present

## 2022-05-21 DIAGNOSIS — Z20828 Contact with and (suspected) exposure to other viral communicable diseases: Secondary | ICD-10-CM | POA: Diagnosis not present

## 2022-05-21 DIAGNOSIS — Z1159 Encounter for screening for other viral diseases: Secondary | ICD-10-CM | POA: Diagnosis not present

## 2022-05-30 DIAGNOSIS — Z1159 Encounter for screening for other viral diseases: Secondary | ICD-10-CM | POA: Diagnosis not present

## 2022-05-30 DIAGNOSIS — Z20828 Contact with and (suspected) exposure to other viral communicable diseases: Secondary | ICD-10-CM | POA: Diagnosis not present

## 2022-06-10 DIAGNOSIS — Z1159 Encounter for screening for other viral diseases: Secondary | ICD-10-CM | POA: Diagnosis not present

## 2022-06-10 DIAGNOSIS — Z20828 Contact with and (suspected) exposure to other viral communicable diseases: Secondary | ICD-10-CM | POA: Diagnosis not present

## 2022-06-21 DIAGNOSIS — Z1159 Encounter for screening for other viral diseases: Secondary | ICD-10-CM | POA: Diagnosis not present

## 2022-06-21 DIAGNOSIS — Z20828 Contact with and (suspected) exposure to other viral communicable diseases: Secondary | ICD-10-CM | POA: Diagnosis not present

## 2022-06-28 DIAGNOSIS — Z1159 Encounter for screening for other viral diseases: Secondary | ICD-10-CM | POA: Diagnosis not present

## 2022-06-28 DIAGNOSIS — Z20828 Contact with and (suspected) exposure to other viral communicable diseases: Secondary | ICD-10-CM | POA: Diagnosis not present

## 2022-07-10 DIAGNOSIS — Z20828 Contact with and (suspected) exposure to other viral communicable diseases: Secondary | ICD-10-CM | POA: Diagnosis not present

## 2022-07-10 DIAGNOSIS — Z1159 Encounter for screening for other viral diseases: Secondary | ICD-10-CM | POA: Diagnosis not present

## 2022-07-18 DIAGNOSIS — Z20828 Contact with and (suspected) exposure to other viral communicable diseases: Secondary | ICD-10-CM | POA: Diagnosis not present

## 2022-07-18 DIAGNOSIS — Z1159 Encounter for screening for other viral diseases: Secondary | ICD-10-CM | POA: Diagnosis not present

## 2022-07-27 DIAGNOSIS — Z1159 Encounter for screening for other viral diseases: Secondary | ICD-10-CM | POA: Diagnosis not present

## 2022-07-27 DIAGNOSIS — Z20828 Contact with and (suspected) exposure to other viral communicable diseases: Secondary | ICD-10-CM | POA: Diagnosis not present

## 2022-08-08 DIAGNOSIS — Z1159 Encounter for screening for other viral diseases: Secondary | ICD-10-CM | POA: Diagnosis not present

## 2022-08-08 DIAGNOSIS — Z20828 Contact with and (suspected) exposure to other viral communicable diseases: Secondary | ICD-10-CM | POA: Diagnosis not present

## 2022-08-20 DIAGNOSIS — Z1159 Encounter for screening for other viral diseases: Secondary | ICD-10-CM | POA: Diagnosis not present

## 2022-08-20 DIAGNOSIS — Z20828 Contact with and (suspected) exposure to other viral communicable diseases: Secondary | ICD-10-CM | POA: Diagnosis not present

## 2022-08-23 DIAGNOSIS — F02B2 Dementia in other diseases classified elsewhere, moderate, with psychotic disturbance: Secondary | ICD-10-CM | POA: Diagnosis not present

## 2022-08-23 DIAGNOSIS — Z515 Encounter for palliative care: Secondary | ICD-10-CM | POA: Diagnosis not present

## 2022-08-23 DIAGNOSIS — G20C Parkinsonism, unspecified: Secondary | ICD-10-CM | POA: Diagnosis not present

## 2022-10-02 DIAGNOSIS — G20C Parkinsonism, unspecified: Secondary | ICD-10-CM | POA: Diagnosis not present

## 2022-10-02 DIAGNOSIS — F02B2 Dementia in other diseases classified elsewhere, moderate, with psychotic disturbance: Secondary | ICD-10-CM | POA: Diagnosis not present

## 2022-10-02 DIAGNOSIS — Z515 Encounter for palliative care: Secondary | ICD-10-CM | POA: Diagnosis not present

## 2022-11-08 DIAGNOSIS — F411 Generalized anxiety disorder: Secondary | ICD-10-CM | POA: Diagnosis not present

## 2022-11-08 DIAGNOSIS — F341 Dysthymic disorder: Secondary | ICD-10-CM | POA: Diagnosis not present

## 2022-11-19 DIAGNOSIS — Z515 Encounter for palliative care: Secondary | ICD-10-CM | POA: Diagnosis not present

## 2022-11-19 DIAGNOSIS — G20C Parkinsonism, unspecified: Secondary | ICD-10-CM | POA: Diagnosis not present

## 2022-11-19 DIAGNOSIS — F02B2 Dementia in other diseases classified elsewhere, moderate, with psychotic disturbance: Secondary | ICD-10-CM | POA: Diagnosis not present

## 2022-11-23 DIAGNOSIS — Z85820 Personal history of malignant melanoma of skin: Secondary | ICD-10-CM | POA: Diagnosis not present

## 2022-11-23 DIAGNOSIS — F32A Depression, unspecified: Secondary | ICD-10-CM | POA: Diagnosis not present

## 2022-11-23 DIAGNOSIS — M1990 Unspecified osteoarthritis, unspecified site: Secondary | ICD-10-CM | POA: Diagnosis not present

## 2022-11-23 DIAGNOSIS — G20C Parkinsonism, unspecified: Secondary | ICD-10-CM | POA: Diagnosis not present

## 2022-11-23 DIAGNOSIS — N3946 Mixed incontinence: Secondary | ICD-10-CM | POA: Diagnosis not present

## 2022-11-23 DIAGNOSIS — R531 Weakness: Secondary | ICD-10-CM | POA: Diagnosis not present

## 2022-11-23 DIAGNOSIS — N1832 Chronic kidney disease, stage 3b: Secondary | ICD-10-CM | POA: Diagnosis not present

## 2022-11-23 DIAGNOSIS — K589 Irritable bowel syndrome without diarrhea: Secondary | ICD-10-CM | POA: Diagnosis not present

## 2022-11-23 DIAGNOSIS — H353190 Nonexudative age-related macular degeneration, unspecified eye, stage unspecified: Secondary | ICD-10-CM | POA: Diagnosis not present

## 2022-11-23 DIAGNOSIS — D519 Vitamin B12 deficiency anemia, unspecified: Secondary | ICD-10-CM | POA: Diagnosis not present

## 2022-11-23 DIAGNOSIS — I10 Essential (primary) hypertension: Secondary | ICD-10-CM | POA: Diagnosis not present

## 2022-11-23 DIAGNOSIS — R630 Anorexia: Secondary | ICD-10-CM | POA: Diagnosis not present

## 2022-11-23 DIAGNOSIS — F419 Anxiety disorder, unspecified: Secondary | ICD-10-CM | POA: Diagnosis not present

## 2022-11-26 DIAGNOSIS — H353190 Nonexudative age-related macular degeneration, unspecified eye, stage unspecified: Secondary | ICD-10-CM | POA: Diagnosis not present

## 2022-11-26 DIAGNOSIS — Z85820 Personal history of malignant melanoma of skin: Secondary | ICD-10-CM | POA: Diagnosis not present

## 2022-11-26 DIAGNOSIS — N1832 Chronic kidney disease, stage 3b: Secondary | ICD-10-CM | POA: Diagnosis not present

## 2022-11-26 DIAGNOSIS — G20C Parkinsonism, unspecified: Secondary | ICD-10-CM | POA: Diagnosis not present

## 2022-11-26 DIAGNOSIS — K589 Irritable bowel syndrome without diarrhea: Secondary | ICD-10-CM | POA: Diagnosis not present

## 2022-11-26 DIAGNOSIS — I10 Essential (primary) hypertension: Secondary | ICD-10-CM | POA: Diagnosis not present

## 2022-11-28 DIAGNOSIS — G20C Parkinsonism, unspecified: Secondary | ICD-10-CM | POA: Diagnosis not present

## 2022-11-28 DIAGNOSIS — I10 Essential (primary) hypertension: Secondary | ICD-10-CM | POA: Diagnosis not present

## 2022-11-28 DIAGNOSIS — H353190 Nonexudative age-related macular degeneration, unspecified eye, stage unspecified: Secondary | ICD-10-CM | POA: Diagnosis not present

## 2022-11-28 DIAGNOSIS — Z85820 Personal history of malignant melanoma of skin: Secondary | ICD-10-CM | POA: Diagnosis not present

## 2022-11-28 DIAGNOSIS — N1832 Chronic kidney disease, stage 3b: Secondary | ICD-10-CM | POA: Diagnosis not present

## 2022-11-28 DIAGNOSIS — K589 Irritable bowel syndrome without diarrhea: Secondary | ICD-10-CM | POA: Diagnosis not present

## 2022-11-30 DIAGNOSIS — N1832 Chronic kidney disease, stage 3b: Secondary | ICD-10-CM | POA: Diagnosis not present

## 2022-11-30 DIAGNOSIS — H353190 Nonexudative age-related macular degeneration, unspecified eye, stage unspecified: Secondary | ICD-10-CM | POA: Diagnosis not present

## 2022-11-30 DIAGNOSIS — G20C Parkinsonism, unspecified: Secondary | ICD-10-CM | POA: Diagnosis not present

## 2022-11-30 DIAGNOSIS — Z85820 Personal history of malignant melanoma of skin: Secondary | ICD-10-CM | POA: Diagnosis not present

## 2022-11-30 DIAGNOSIS — K589 Irritable bowel syndrome without diarrhea: Secondary | ICD-10-CM | POA: Diagnosis not present

## 2022-11-30 DIAGNOSIS — I10 Essential (primary) hypertension: Secondary | ICD-10-CM | POA: Diagnosis not present

## 2022-12-05 DIAGNOSIS — Z85820 Personal history of malignant melanoma of skin: Secondary | ICD-10-CM | POA: Diagnosis not present

## 2022-12-05 DIAGNOSIS — H353190 Nonexudative age-related macular degeneration, unspecified eye, stage unspecified: Secondary | ICD-10-CM | POA: Diagnosis not present

## 2022-12-05 DIAGNOSIS — K589 Irritable bowel syndrome without diarrhea: Secondary | ICD-10-CM | POA: Diagnosis not present

## 2022-12-05 DIAGNOSIS — N1832 Chronic kidney disease, stage 3b: Secondary | ICD-10-CM | POA: Diagnosis not present

## 2022-12-05 DIAGNOSIS — G20C Parkinsonism, unspecified: Secondary | ICD-10-CM | POA: Diagnosis not present

## 2022-12-05 DIAGNOSIS — I10 Essential (primary) hypertension: Secondary | ICD-10-CM | POA: Diagnosis not present

## 2022-12-07 DIAGNOSIS — I10 Essential (primary) hypertension: Secondary | ICD-10-CM | POA: Diagnosis not present

## 2022-12-07 DIAGNOSIS — G20C Parkinsonism, unspecified: Secondary | ICD-10-CM | POA: Diagnosis not present

## 2022-12-07 DIAGNOSIS — K589 Irritable bowel syndrome without diarrhea: Secondary | ICD-10-CM | POA: Diagnosis not present

## 2022-12-07 DIAGNOSIS — Z85820 Personal history of malignant melanoma of skin: Secondary | ICD-10-CM | POA: Diagnosis not present

## 2022-12-07 DIAGNOSIS — H353190 Nonexudative age-related macular degeneration, unspecified eye, stage unspecified: Secondary | ICD-10-CM | POA: Diagnosis not present

## 2022-12-07 DIAGNOSIS — N1832 Chronic kidney disease, stage 3b: Secondary | ICD-10-CM | POA: Diagnosis not present

## 2022-12-10 DIAGNOSIS — Z85820 Personal history of malignant melanoma of skin: Secondary | ICD-10-CM | POA: Diagnosis not present

## 2022-12-10 DIAGNOSIS — G20C Parkinsonism, unspecified: Secondary | ICD-10-CM | POA: Diagnosis not present

## 2022-12-10 DIAGNOSIS — K589 Irritable bowel syndrome without diarrhea: Secondary | ICD-10-CM | POA: Diagnosis not present

## 2022-12-10 DIAGNOSIS — H353190 Nonexudative age-related macular degeneration, unspecified eye, stage unspecified: Secondary | ICD-10-CM | POA: Diagnosis not present

## 2022-12-10 DIAGNOSIS — N1832 Chronic kidney disease, stage 3b: Secondary | ICD-10-CM | POA: Diagnosis not present

## 2022-12-10 DIAGNOSIS — I10 Essential (primary) hypertension: Secondary | ICD-10-CM | POA: Diagnosis not present

## 2022-12-12 DIAGNOSIS — K589 Irritable bowel syndrome without diarrhea: Secondary | ICD-10-CM | POA: Diagnosis not present

## 2022-12-12 DIAGNOSIS — Z85820 Personal history of malignant melanoma of skin: Secondary | ICD-10-CM | POA: Diagnosis not present

## 2022-12-12 DIAGNOSIS — H353190 Nonexudative age-related macular degeneration, unspecified eye, stage unspecified: Secondary | ICD-10-CM | POA: Diagnosis not present

## 2022-12-12 DIAGNOSIS — I10 Essential (primary) hypertension: Secondary | ICD-10-CM | POA: Diagnosis not present

## 2022-12-12 DIAGNOSIS — G20C Parkinsonism, unspecified: Secondary | ICD-10-CM | POA: Diagnosis not present

## 2022-12-12 DIAGNOSIS — N1832 Chronic kidney disease, stage 3b: Secondary | ICD-10-CM | POA: Diagnosis not present

## 2022-12-14 DIAGNOSIS — I10 Essential (primary) hypertension: Secondary | ICD-10-CM | POA: Diagnosis not present

## 2022-12-14 DIAGNOSIS — N1832 Chronic kidney disease, stage 3b: Secondary | ICD-10-CM | POA: Diagnosis not present

## 2022-12-14 DIAGNOSIS — K589 Irritable bowel syndrome without diarrhea: Secondary | ICD-10-CM | POA: Diagnosis not present

## 2022-12-14 DIAGNOSIS — G20C Parkinsonism, unspecified: Secondary | ICD-10-CM | POA: Diagnosis not present

## 2022-12-14 DIAGNOSIS — H353190 Nonexudative age-related macular degeneration, unspecified eye, stage unspecified: Secondary | ICD-10-CM | POA: Diagnosis not present

## 2022-12-14 DIAGNOSIS — Z85820 Personal history of malignant melanoma of skin: Secondary | ICD-10-CM | POA: Diagnosis not present

## 2022-12-17 DIAGNOSIS — Z85820 Personal history of malignant melanoma of skin: Secondary | ICD-10-CM | POA: Diagnosis not present

## 2022-12-17 DIAGNOSIS — H353190 Nonexudative age-related macular degeneration, unspecified eye, stage unspecified: Secondary | ICD-10-CM | POA: Diagnosis not present

## 2022-12-17 DIAGNOSIS — I10 Essential (primary) hypertension: Secondary | ICD-10-CM | POA: Diagnosis not present

## 2022-12-17 DIAGNOSIS — G20C Parkinsonism, unspecified: Secondary | ICD-10-CM | POA: Diagnosis not present

## 2022-12-17 DIAGNOSIS — N1832 Chronic kidney disease, stage 3b: Secondary | ICD-10-CM | POA: Diagnosis not present

## 2022-12-17 DIAGNOSIS — K589 Irritable bowel syndrome without diarrhea: Secondary | ICD-10-CM | POA: Diagnosis not present

## 2022-12-21 DIAGNOSIS — F32A Depression, unspecified: Secondary | ICD-10-CM | POA: Diagnosis not present

## 2022-12-21 DIAGNOSIS — R630 Anorexia: Secondary | ICD-10-CM | POA: Diagnosis not present

## 2022-12-21 DIAGNOSIS — K589 Irritable bowel syndrome without diarrhea: Secondary | ICD-10-CM | POA: Diagnosis not present

## 2022-12-21 DIAGNOSIS — F419 Anxiety disorder, unspecified: Secondary | ICD-10-CM | POA: Diagnosis not present

## 2022-12-21 DIAGNOSIS — Z85820 Personal history of malignant melanoma of skin: Secondary | ICD-10-CM | POA: Diagnosis not present

## 2022-12-21 DIAGNOSIS — D519 Vitamin B12 deficiency anemia, unspecified: Secondary | ICD-10-CM | POA: Diagnosis not present

## 2022-12-21 DIAGNOSIS — M1990 Unspecified osteoarthritis, unspecified site: Secondary | ICD-10-CM | POA: Diagnosis not present

## 2022-12-21 DIAGNOSIS — R531 Weakness: Secondary | ICD-10-CM | POA: Diagnosis not present

## 2022-12-21 DIAGNOSIS — H353190 Nonexudative age-related macular degeneration, unspecified eye, stage unspecified: Secondary | ICD-10-CM | POA: Diagnosis not present

## 2022-12-21 DIAGNOSIS — I10 Essential (primary) hypertension: Secondary | ICD-10-CM | POA: Diagnosis not present

## 2022-12-21 DIAGNOSIS — N3946 Mixed incontinence: Secondary | ICD-10-CM | POA: Diagnosis not present

## 2022-12-21 DIAGNOSIS — N1832 Chronic kidney disease, stage 3b: Secondary | ICD-10-CM | POA: Diagnosis not present

## 2022-12-21 DIAGNOSIS — G20C Parkinsonism, unspecified: Secondary | ICD-10-CM | POA: Diagnosis not present

## 2022-12-24 DIAGNOSIS — K589 Irritable bowel syndrome without diarrhea: Secondary | ICD-10-CM | POA: Diagnosis not present

## 2022-12-24 DIAGNOSIS — H353190 Nonexudative age-related macular degeneration, unspecified eye, stage unspecified: Secondary | ICD-10-CM | POA: Diagnosis not present

## 2022-12-24 DIAGNOSIS — Z85820 Personal history of malignant melanoma of skin: Secondary | ICD-10-CM | POA: Diagnosis not present

## 2022-12-24 DIAGNOSIS — G20C Parkinsonism, unspecified: Secondary | ICD-10-CM | POA: Diagnosis not present

## 2022-12-24 DIAGNOSIS — I10 Essential (primary) hypertension: Secondary | ICD-10-CM | POA: Diagnosis not present

## 2022-12-24 DIAGNOSIS — N1832 Chronic kidney disease, stage 3b: Secondary | ICD-10-CM | POA: Diagnosis not present

## 2022-12-26 DIAGNOSIS — Z85820 Personal history of malignant melanoma of skin: Secondary | ICD-10-CM | POA: Diagnosis not present

## 2022-12-26 DIAGNOSIS — N1832 Chronic kidney disease, stage 3b: Secondary | ICD-10-CM | POA: Diagnosis not present

## 2022-12-26 DIAGNOSIS — K589 Irritable bowel syndrome without diarrhea: Secondary | ICD-10-CM | POA: Diagnosis not present

## 2022-12-26 DIAGNOSIS — H353190 Nonexudative age-related macular degeneration, unspecified eye, stage unspecified: Secondary | ICD-10-CM | POA: Diagnosis not present

## 2022-12-26 DIAGNOSIS — I10 Essential (primary) hypertension: Secondary | ICD-10-CM | POA: Diagnosis not present

## 2022-12-26 DIAGNOSIS — G20C Parkinsonism, unspecified: Secondary | ICD-10-CM | POA: Diagnosis not present

## 2022-12-28 DIAGNOSIS — H353190 Nonexudative age-related macular degeneration, unspecified eye, stage unspecified: Secondary | ICD-10-CM | POA: Diagnosis not present

## 2022-12-28 DIAGNOSIS — I10 Essential (primary) hypertension: Secondary | ICD-10-CM | POA: Diagnosis not present

## 2022-12-28 DIAGNOSIS — Z85820 Personal history of malignant melanoma of skin: Secondary | ICD-10-CM | POA: Diagnosis not present

## 2022-12-28 DIAGNOSIS — K589 Irritable bowel syndrome without diarrhea: Secondary | ICD-10-CM | POA: Diagnosis not present

## 2022-12-28 DIAGNOSIS — G20C Parkinsonism, unspecified: Secondary | ICD-10-CM | POA: Diagnosis not present

## 2022-12-28 DIAGNOSIS — N1832 Chronic kidney disease, stage 3b: Secondary | ICD-10-CM | POA: Diagnosis not present

## 2022-12-31 DIAGNOSIS — G20C Parkinsonism, unspecified: Secondary | ICD-10-CM | POA: Diagnosis not present

## 2022-12-31 DIAGNOSIS — H353190 Nonexudative age-related macular degeneration, unspecified eye, stage unspecified: Secondary | ICD-10-CM | POA: Diagnosis not present

## 2022-12-31 DIAGNOSIS — N1832 Chronic kidney disease, stage 3b: Secondary | ICD-10-CM | POA: Diagnosis not present

## 2022-12-31 DIAGNOSIS — Z85820 Personal history of malignant melanoma of skin: Secondary | ICD-10-CM | POA: Diagnosis not present

## 2022-12-31 DIAGNOSIS — I10 Essential (primary) hypertension: Secondary | ICD-10-CM | POA: Diagnosis not present

## 2022-12-31 DIAGNOSIS — K589 Irritable bowel syndrome without diarrhea: Secondary | ICD-10-CM | POA: Diagnosis not present

## 2023-01-01 DIAGNOSIS — G20C Parkinsonism, unspecified: Secondary | ICD-10-CM | POA: Diagnosis not present

## 2023-01-01 DIAGNOSIS — H353190 Nonexudative age-related macular degeneration, unspecified eye, stage unspecified: Secondary | ICD-10-CM | POA: Diagnosis not present

## 2023-01-01 DIAGNOSIS — I10 Essential (primary) hypertension: Secondary | ICD-10-CM | POA: Diagnosis not present

## 2023-01-01 DIAGNOSIS — N1832 Chronic kidney disease, stage 3b: Secondary | ICD-10-CM | POA: Diagnosis not present

## 2023-01-01 DIAGNOSIS — Z85820 Personal history of malignant melanoma of skin: Secondary | ICD-10-CM | POA: Diagnosis not present

## 2023-01-01 DIAGNOSIS — K589 Irritable bowel syndrome without diarrhea: Secondary | ICD-10-CM | POA: Diagnosis not present

## 2023-01-02 DIAGNOSIS — G20C Parkinsonism, unspecified: Secondary | ICD-10-CM | POA: Diagnosis not present

## 2023-01-02 DIAGNOSIS — H353190 Nonexudative age-related macular degeneration, unspecified eye, stage unspecified: Secondary | ICD-10-CM | POA: Diagnosis not present

## 2023-01-02 DIAGNOSIS — K589 Irritable bowel syndrome without diarrhea: Secondary | ICD-10-CM | POA: Diagnosis not present

## 2023-01-02 DIAGNOSIS — Z85820 Personal history of malignant melanoma of skin: Secondary | ICD-10-CM | POA: Diagnosis not present

## 2023-01-02 DIAGNOSIS — N1832 Chronic kidney disease, stage 3b: Secondary | ICD-10-CM | POA: Diagnosis not present

## 2023-01-02 DIAGNOSIS — I10 Essential (primary) hypertension: Secondary | ICD-10-CM | POA: Diagnosis not present

## 2023-01-03 DIAGNOSIS — H353190 Nonexudative age-related macular degeneration, unspecified eye, stage unspecified: Secondary | ICD-10-CM | POA: Diagnosis not present

## 2023-01-03 DIAGNOSIS — N1832 Chronic kidney disease, stage 3b: Secondary | ICD-10-CM | POA: Diagnosis not present

## 2023-01-03 DIAGNOSIS — K589 Irritable bowel syndrome without diarrhea: Secondary | ICD-10-CM | POA: Diagnosis not present

## 2023-01-03 DIAGNOSIS — G20C Parkinsonism, unspecified: Secondary | ICD-10-CM | POA: Diagnosis not present

## 2023-01-03 DIAGNOSIS — Z85820 Personal history of malignant melanoma of skin: Secondary | ICD-10-CM | POA: Diagnosis not present

## 2023-01-03 DIAGNOSIS — I10 Essential (primary) hypertension: Secondary | ICD-10-CM | POA: Diagnosis not present

## 2023-01-07 DIAGNOSIS — N1832 Chronic kidney disease, stage 3b: Secondary | ICD-10-CM | POA: Diagnosis not present

## 2023-01-07 DIAGNOSIS — H353190 Nonexudative age-related macular degeneration, unspecified eye, stage unspecified: Secondary | ICD-10-CM | POA: Diagnosis not present

## 2023-01-07 DIAGNOSIS — G20C Parkinsonism, unspecified: Secondary | ICD-10-CM | POA: Diagnosis not present

## 2023-01-07 DIAGNOSIS — Z85820 Personal history of malignant melanoma of skin: Secondary | ICD-10-CM | POA: Diagnosis not present

## 2023-01-07 DIAGNOSIS — K589 Irritable bowel syndrome without diarrhea: Secondary | ICD-10-CM | POA: Diagnosis not present

## 2023-01-07 DIAGNOSIS — I10 Essential (primary) hypertension: Secondary | ICD-10-CM | POA: Diagnosis not present

## 2023-01-10 DIAGNOSIS — N1832 Chronic kidney disease, stage 3b: Secondary | ICD-10-CM | POA: Diagnosis not present

## 2023-01-10 DIAGNOSIS — K589 Irritable bowel syndrome without diarrhea: Secondary | ICD-10-CM | POA: Diagnosis not present

## 2023-01-10 DIAGNOSIS — Z85820 Personal history of malignant melanoma of skin: Secondary | ICD-10-CM | POA: Diagnosis not present

## 2023-01-10 DIAGNOSIS — G20C Parkinsonism, unspecified: Secondary | ICD-10-CM | POA: Diagnosis not present

## 2023-01-10 DIAGNOSIS — I10 Essential (primary) hypertension: Secondary | ICD-10-CM | POA: Diagnosis not present

## 2023-01-10 DIAGNOSIS — H353190 Nonexudative age-related macular degeneration, unspecified eye, stage unspecified: Secondary | ICD-10-CM | POA: Diagnosis not present

## 2023-01-11 DIAGNOSIS — H353190 Nonexudative age-related macular degeneration, unspecified eye, stage unspecified: Secondary | ICD-10-CM | POA: Diagnosis not present

## 2023-01-11 DIAGNOSIS — N1832 Chronic kidney disease, stage 3b: Secondary | ICD-10-CM | POA: Diagnosis not present

## 2023-01-11 DIAGNOSIS — K589 Irritable bowel syndrome without diarrhea: Secondary | ICD-10-CM | POA: Diagnosis not present

## 2023-01-11 DIAGNOSIS — I10 Essential (primary) hypertension: Secondary | ICD-10-CM | POA: Diagnosis not present

## 2023-01-11 DIAGNOSIS — G20C Parkinsonism, unspecified: Secondary | ICD-10-CM | POA: Diagnosis not present

## 2023-01-11 DIAGNOSIS — Z85820 Personal history of malignant melanoma of skin: Secondary | ICD-10-CM | POA: Diagnosis not present

## 2023-01-14 DIAGNOSIS — I10 Essential (primary) hypertension: Secondary | ICD-10-CM | POA: Diagnosis not present

## 2023-01-14 DIAGNOSIS — N1832 Chronic kidney disease, stage 3b: Secondary | ICD-10-CM | POA: Diagnosis not present

## 2023-01-14 DIAGNOSIS — Z85820 Personal history of malignant melanoma of skin: Secondary | ICD-10-CM | POA: Diagnosis not present

## 2023-01-14 DIAGNOSIS — G20C Parkinsonism, unspecified: Secondary | ICD-10-CM | POA: Diagnosis not present

## 2023-01-14 DIAGNOSIS — H353190 Nonexudative age-related macular degeneration, unspecified eye, stage unspecified: Secondary | ICD-10-CM | POA: Diagnosis not present

## 2023-01-14 DIAGNOSIS — K589 Irritable bowel syndrome without diarrhea: Secondary | ICD-10-CM | POA: Diagnosis not present

## 2023-01-17 DIAGNOSIS — K589 Irritable bowel syndrome without diarrhea: Secondary | ICD-10-CM | POA: Diagnosis not present

## 2023-01-17 DIAGNOSIS — H353190 Nonexudative age-related macular degeneration, unspecified eye, stage unspecified: Secondary | ICD-10-CM | POA: Diagnosis not present

## 2023-01-17 DIAGNOSIS — I10 Essential (primary) hypertension: Secondary | ICD-10-CM | POA: Diagnosis not present

## 2023-01-17 DIAGNOSIS — N1832 Chronic kidney disease, stage 3b: Secondary | ICD-10-CM | POA: Diagnosis not present

## 2023-01-17 DIAGNOSIS — G20C Parkinsonism, unspecified: Secondary | ICD-10-CM | POA: Diagnosis not present

## 2023-01-17 DIAGNOSIS — Z85820 Personal history of malignant melanoma of skin: Secondary | ICD-10-CM | POA: Diagnosis not present

## 2023-01-18 DIAGNOSIS — K589 Irritable bowel syndrome without diarrhea: Secondary | ICD-10-CM | POA: Diagnosis not present

## 2023-01-18 DIAGNOSIS — N1832 Chronic kidney disease, stage 3b: Secondary | ICD-10-CM | POA: Diagnosis not present

## 2023-01-18 DIAGNOSIS — G20C Parkinsonism, unspecified: Secondary | ICD-10-CM | POA: Diagnosis not present

## 2023-01-18 DIAGNOSIS — H353190 Nonexudative age-related macular degeneration, unspecified eye, stage unspecified: Secondary | ICD-10-CM | POA: Diagnosis not present

## 2023-01-18 DIAGNOSIS — Z85820 Personal history of malignant melanoma of skin: Secondary | ICD-10-CM | POA: Diagnosis not present

## 2023-01-18 DIAGNOSIS — I10 Essential (primary) hypertension: Secondary | ICD-10-CM | POA: Diagnosis not present

## 2023-01-21 DIAGNOSIS — F32A Depression, unspecified: Secondary | ICD-10-CM | POA: Diagnosis not present

## 2023-01-21 DIAGNOSIS — G20C Parkinsonism, unspecified: Secondary | ICD-10-CM | POA: Diagnosis not present

## 2023-01-21 DIAGNOSIS — H353190 Nonexudative age-related macular degeneration, unspecified eye, stage unspecified: Secondary | ICD-10-CM | POA: Diagnosis not present

## 2023-01-21 DIAGNOSIS — D519 Vitamin B12 deficiency anemia, unspecified: Secondary | ICD-10-CM | POA: Diagnosis not present

## 2023-01-21 DIAGNOSIS — I10 Essential (primary) hypertension: Secondary | ICD-10-CM | POA: Diagnosis not present

## 2023-01-21 DIAGNOSIS — N1832 Chronic kidney disease, stage 3b: Secondary | ICD-10-CM | POA: Diagnosis not present

## 2023-01-21 DIAGNOSIS — Z85820 Personal history of malignant melanoma of skin: Secondary | ICD-10-CM | POA: Diagnosis not present

## 2023-01-21 DIAGNOSIS — K589 Irritable bowel syndrome without diarrhea: Secondary | ICD-10-CM | POA: Diagnosis not present

## 2023-01-21 DIAGNOSIS — N3946 Mixed incontinence: Secondary | ICD-10-CM | POA: Diagnosis not present

## 2023-01-21 DIAGNOSIS — M1990 Unspecified osteoarthritis, unspecified site: Secondary | ICD-10-CM | POA: Diagnosis not present

## 2023-01-21 DIAGNOSIS — R630 Anorexia: Secondary | ICD-10-CM | POA: Diagnosis not present

## 2023-01-21 DIAGNOSIS — R531 Weakness: Secondary | ICD-10-CM | POA: Diagnosis not present

## 2023-01-21 DIAGNOSIS — F419 Anxiety disorder, unspecified: Secondary | ICD-10-CM | POA: Diagnosis not present

## 2023-01-24 DIAGNOSIS — G20C Parkinsonism, unspecified: Secondary | ICD-10-CM | POA: Diagnosis not present

## 2023-01-24 DIAGNOSIS — H353190 Nonexudative age-related macular degeneration, unspecified eye, stage unspecified: Secondary | ICD-10-CM | POA: Diagnosis not present

## 2023-01-24 DIAGNOSIS — I10 Essential (primary) hypertension: Secondary | ICD-10-CM | POA: Diagnosis not present

## 2023-01-24 DIAGNOSIS — N1832 Chronic kidney disease, stage 3b: Secondary | ICD-10-CM | POA: Diagnosis not present

## 2023-01-24 DIAGNOSIS — K589 Irritable bowel syndrome without diarrhea: Secondary | ICD-10-CM | POA: Diagnosis not present

## 2023-01-24 DIAGNOSIS — Z85820 Personal history of malignant melanoma of skin: Secondary | ICD-10-CM | POA: Diagnosis not present

## 2023-01-28 DIAGNOSIS — H353190 Nonexudative age-related macular degeneration, unspecified eye, stage unspecified: Secondary | ICD-10-CM | POA: Diagnosis not present

## 2023-01-28 DIAGNOSIS — K589 Irritable bowel syndrome without diarrhea: Secondary | ICD-10-CM | POA: Diagnosis not present

## 2023-01-28 DIAGNOSIS — Z85820 Personal history of malignant melanoma of skin: Secondary | ICD-10-CM | POA: Diagnosis not present

## 2023-01-28 DIAGNOSIS — G20C Parkinsonism, unspecified: Secondary | ICD-10-CM | POA: Diagnosis not present

## 2023-01-28 DIAGNOSIS — N1832 Chronic kidney disease, stage 3b: Secondary | ICD-10-CM | POA: Diagnosis not present

## 2023-01-28 DIAGNOSIS — I10 Essential (primary) hypertension: Secondary | ICD-10-CM | POA: Diagnosis not present

## 2023-01-31 DIAGNOSIS — H353190 Nonexudative age-related macular degeneration, unspecified eye, stage unspecified: Secondary | ICD-10-CM | POA: Diagnosis not present

## 2023-01-31 DIAGNOSIS — G20C Parkinsonism, unspecified: Secondary | ICD-10-CM | POA: Diagnosis not present

## 2023-01-31 DIAGNOSIS — Z85820 Personal history of malignant melanoma of skin: Secondary | ICD-10-CM | POA: Diagnosis not present

## 2023-01-31 DIAGNOSIS — K589 Irritable bowel syndrome without diarrhea: Secondary | ICD-10-CM | POA: Diagnosis not present

## 2023-01-31 DIAGNOSIS — I10 Essential (primary) hypertension: Secondary | ICD-10-CM | POA: Diagnosis not present

## 2023-01-31 DIAGNOSIS — N1832 Chronic kidney disease, stage 3b: Secondary | ICD-10-CM | POA: Diagnosis not present

## 2023-02-01 DIAGNOSIS — Z85820 Personal history of malignant melanoma of skin: Secondary | ICD-10-CM | POA: Diagnosis not present

## 2023-02-01 DIAGNOSIS — I10 Essential (primary) hypertension: Secondary | ICD-10-CM | POA: Diagnosis not present

## 2023-02-01 DIAGNOSIS — H353190 Nonexudative age-related macular degeneration, unspecified eye, stage unspecified: Secondary | ICD-10-CM | POA: Diagnosis not present

## 2023-02-01 DIAGNOSIS — K589 Irritable bowel syndrome without diarrhea: Secondary | ICD-10-CM | POA: Diagnosis not present

## 2023-02-01 DIAGNOSIS — G20C Parkinsonism, unspecified: Secondary | ICD-10-CM | POA: Diagnosis not present

## 2023-02-01 DIAGNOSIS — N1832 Chronic kidney disease, stage 3b: Secondary | ICD-10-CM | POA: Diagnosis not present

## 2023-02-04 DIAGNOSIS — I10 Essential (primary) hypertension: Secondary | ICD-10-CM | POA: Diagnosis not present

## 2023-02-04 DIAGNOSIS — N1832 Chronic kidney disease, stage 3b: Secondary | ICD-10-CM | POA: Diagnosis not present

## 2023-02-04 DIAGNOSIS — G20C Parkinsonism, unspecified: Secondary | ICD-10-CM | POA: Diagnosis not present

## 2023-02-04 DIAGNOSIS — Z85820 Personal history of malignant melanoma of skin: Secondary | ICD-10-CM | POA: Diagnosis not present

## 2023-02-04 DIAGNOSIS — H353190 Nonexudative age-related macular degeneration, unspecified eye, stage unspecified: Secondary | ICD-10-CM | POA: Diagnosis not present

## 2023-02-04 DIAGNOSIS — K589 Irritable bowel syndrome without diarrhea: Secondary | ICD-10-CM | POA: Diagnosis not present

## 2023-02-07 DIAGNOSIS — Z85820 Personal history of malignant melanoma of skin: Secondary | ICD-10-CM | POA: Diagnosis not present

## 2023-02-07 DIAGNOSIS — I10 Essential (primary) hypertension: Secondary | ICD-10-CM | POA: Diagnosis not present

## 2023-02-07 DIAGNOSIS — N1832 Chronic kidney disease, stage 3b: Secondary | ICD-10-CM | POA: Diagnosis not present

## 2023-02-07 DIAGNOSIS — G20C Parkinsonism, unspecified: Secondary | ICD-10-CM | POA: Diagnosis not present

## 2023-02-07 DIAGNOSIS — K589 Irritable bowel syndrome without diarrhea: Secondary | ICD-10-CM | POA: Diagnosis not present

## 2023-02-07 DIAGNOSIS — H353190 Nonexudative age-related macular degeneration, unspecified eye, stage unspecified: Secondary | ICD-10-CM | POA: Diagnosis not present

## 2023-02-08 DIAGNOSIS — K589 Irritable bowel syndrome without diarrhea: Secondary | ICD-10-CM | POA: Diagnosis not present

## 2023-02-08 DIAGNOSIS — I10 Essential (primary) hypertension: Secondary | ICD-10-CM | POA: Diagnosis not present

## 2023-02-08 DIAGNOSIS — Z85820 Personal history of malignant melanoma of skin: Secondary | ICD-10-CM | POA: Diagnosis not present

## 2023-02-08 DIAGNOSIS — H353190 Nonexudative age-related macular degeneration, unspecified eye, stage unspecified: Secondary | ICD-10-CM | POA: Diagnosis not present

## 2023-02-08 DIAGNOSIS — N1832 Chronic kidney disease, stage 3b: Secondary | ICD-10-CM | POA: Diagnosis not present

## 2023-02-08 DIAGNOSIS — G20C Parkinsonism, unspecified: Secondary | ICD-10-CM | POA: Diagnosis not present

## 2023-02-11 DIAGNOSIS — K589 Irritable bowel syndrome without diarrhea: Secondary | ICD-10-CM | POA: Diagnosis not present

## 2023-02-11 DIAGNOSIS — Z85820 Personal history of malignant melanoma of skin: Secondary | ICD-10-CM | POA: Diagnosis not present

## 2023-02-11 DIAGNOSIS — H353190 Nonexudative age-related macular degeneration, unspecified eye, stage unspecified: Secondary | ICD-10-CM | POA: Diagnosis not present

## 2023-02-11 DIAGNOSIS — G20C Parkinsonism, unspecified: Secondary | ICD-10-CM | POA: Diagnosis not present

## 2023-02-11 DIAGNOSIS — N1832 Chronic kidney disease, stage 3b: Secondary | ICD-10-CM | POA: Diagnosis not present

## 2023-02-11 DIAGNOSIS — I10 Essential (primary) hypertension: Secondary | ICD-10-CM | POA: Diagnosis not present

## 2023-02-13 DIAGNOSIS — I10 Essential (primary) hypertension: Secondary | ICD-10-CM | POA: Diagnosis not present

## 2023-02-13 DIAGNOSIS — K589 Irritable bowel syndrome without diarrhea: Secondary | ICD-10-CM | POA: Diagnosis not present

## 2023-02-13 DIAGNOSIS — G20C Parkinsonism, unspecified: Secondary | ICD-10-CM | POA: Diagnosis not present

## 2023-02-13 DIAGNOSIS — N1832 Chronic kidney disease, stage 3b: Secondary | ICD-10-CM | POA: Diagnosis not present

## 2023-02-13 DIAGNOSIS — H353190 Nonexudative age-related macular degeneration, unspecified eye, stage unspecified: Secondary | ICD-10-CM | POA: Diagnosis not present

## 2023-02-13 DIAGNOSIS — Z85820 Personal history of malignant melanoma of skin: Secondary | ICD-10-CM | POA: Diagnosis not present

## 2023-02-15 DIAGNOSIS — H353190 Nonexudative age-related macular degeneration, unspecified eye, stage unspecified: Secondary | ICD-10-CM | POA: Diagnosis not present

## 2023-02-15 DIAGNOSIS — G20C Parkinsonism, unspecified: Secondary | ICD-10-CM | POA: Diagnosis not present

## 2023-02-15 DIAGNOSIS — N1832 Chronic kidney disease, stage 3b: Secondary | ICD-10-CM | POA: Diagnosis not present

## 2023-02-15 DIAGNOSIS — K589 Irritable bowel syndrome without diarrhea: Secondary | ICD-10-CM | POA: Diagnosis not present

## 2023-02-15 DIAGNOSIS — Z85820 Personal history of malignant melanoma of skin: Secondary | ICD-10-CM | POA: Diagnosis not present

## 2023-02-15 DIAGNOSIS — I10 Essential (primary) hypertension: Secondary | ICD-10-CM | POA: Diagnosis not present

## 2023-02-18 DIAGNOSIS — G20C Parkinsonism, unspecified: Secondary | ICD-10-CM | POA: Diagnosis not present

## 2023-02-18 DIAGNOSIS — N1832 Chronic kidney disease, stage 3b: Secondary | ICD-10-CM | POA: Diagnosis not present

## 2023-02-18 DIAGNOSIS — I10 Essential (primary) hypertension: Secondary | ICD-10-CM | POA: Diagnosis not present

## 2023-02-18 DIAGNOSIS — K589 Irritable bowel syndrome without diarrhea: Secondary | ICD-10-CM | POA: Diagnosis not present

## 2023-02-18 DIAGNOSIS — Z85820 Personal history of malignant melanoma of skin: Secondary | ICD-10-CM | POA: Diagnosis not present

## 2023-02-18 DIAGNOSIS — H353190 Nonexudative age-related macular degeneration, unspecified eye, stage unspecified: Secondary | ICD-10-CM | POA: Diagnosis not present

## 2023-02-20 DIAGNOSIS — G20C Parkinsonism, unspecified: Secondary | ICD-10-CM | POA: Diagnosis not present

## 2023-02-20 DIAGNOSIS — F32A Depression, unspecified: Secondary | ICD-10-CM | POA: Diagnosis not present

## 2023-02-20 DIAGNOSIS — K589 Irritable bowel syndrome without diarrhea: Secondary | ICD-10-CM | POA: Diagnosis not present

## 2023-02-20 DIAGNOSIS — F419 Anxiety disorder, unspecified: Secondary | ICD-10-CM | POA: Diagnosis not present

## 2023-02-20 DIAGNOSIS — M1990 Unspecified osteoarthritis, unspecified site: Secondary | ICD-10-CM | POA: Diagnosis not present

## 2023-02-20 DIAGNOSIS — I10 Essential (primary) hypertension: Secondary | ICD-10-CM | POA: Diagnosis not present

## 2023-02-20 DIAGNOSIS — N3946 Mixed incontinence: Secondary | ICD-10-CM | POA: Diagnosis not present

## 2023-02-20 DIAGNOSIS — H353190 Nonexudative age-related macular degeneration, unspecified eye, stage unspecified: Secondary | ICD-10-CM | POA: Diagnosis not present

## 2023-02-20 DIAGNOSIS — D519 Vitamin B12 deficiency anemia, unspecified: Secondary | ICD-10-CM | POA: Diagnosis not present

## 2023-02-20 DIAGNOSIS — R630 Anorexia: Secondary | ICD-10-CM | POA: Diagnosis not present

## 2023-02-20 DIAGNOSIS — R531 Weakness: Secondary | ICD-10-CM | POA: Diagnosis not present

## 2023-02-20 DIAGNOSIS — N1832 Chronic kidney disease, stage 3b: Secondary | ICD-10-CM | POA: Diagnosis not present

## 2023-02-20 DIAGNOSIS — Z85820 Personal history of malignant melanoma of skin: Secondary | ICD-10-CM | POA: Diagnosis not present

## 2023-02-21 DIAGNOSIS — H353190 Nonexudative age-related macular degeneration, unspecified eye, stage unspecified: Secondary | ICD-10-CM | POA: Diagnosis not present

## 2023-02-21 DIAGNOSIS — K589 Irritable bowel syndrome without diarrhea: Secondary | ICD-10-CM | POA: Diagnosis not present

## 2023-02-21 DIAGNOSIS — G20C Parkinsonism, unspecified: Secondary | ICD-10-CM | POA: Diagnosis not present

## 2023-02-21 DIAGNOSIS — I10 Essential (primary) hypertension: Secondary | ICD-10-CM | POA: Diagnosis not present

## 2023-02-21 DIAGNOSIS — N1832 Chronic kidney disease, stage 3b: Secondary | ICD-10-CM | POA: Diagnosis not present

## 2023-02-21 DIAGNOSIS — Z85820 Personal history of malignant melanoma of skin: Secondary | ICD-10-CM | POA: Diagnosis not present

## 2023-02-25 DIAGNOSIS — G20C Parkinsonism, unspecified: Secondary | ICD-10-CM | POA: Diagnosis not present

## 2023-02-25 DIAGNOSIS — N1832 Chronic kidney disease, stage 3b: Secondary | ICD-10-CM | POA: Diagnosis not present

## 2023-02-25 DIAGNOSIS — Z85820 Personal history of malignant melanoma of skin: Secondary | ICD-10-CM | POA: Diagnosis not present

## 2023-02-25 DIAGNOSIS — K589 Irritable bowel syndrome without diarrhea: Secondary | ICD-10-CM | POA: Diagnosis not present

## 2023-02-25 DIAGNOSIS — H353190 Nonexudative age-related macular degeneration, unspecified eye, stage unspecified: Secondary | ICD-10-CM | POA: Diagnosis not present

## 2023-02-25 DIAGNOSIS — I10 Essential (primary) hypertension: Secondary | ICD-10-CM | POA: Diagnosis not present

## 2023-02-28 DIAGNOSIS — Z85820 Personal history of malignant melanoma of skin: Secondary | ICD-10-CM | POA: Diagnosis not present

## 2023-02-28 DIAGNOSIS — N1832 Chronic kidney disease, stage 3b: Secondary | ICD-10-CM | POA: Diagnosis not present

## 2023-02-28 DIAGNOSIS — H353190 Nonexudative age-related macular degeneration, unspecified eye, stage unspecified: Secondary | ICD-10-CM | POA: Diagnosis not present

## 2023-02-28 DIAGNOSIS — G20C Parkinsonism, unspecified: Secondary | ICD-10-CM | POA: Diagnosis not present

## 2023-02-28 DIAGNOSIS — K589 Irritable bowel syndrome without diarrhea: Secondary | ICD-10-CM | POA: Diagnosis not present

## 2023-02-28 DIAGNOSIS — I10 Essential (primary) hypertension: Secondary | ICD-10-CM | POA: Diagnosis not present

## 2023-03-01 DIAGNOSIS — H353190 Nonexudative age-related macular degeneration, unspecified eye, stage unspecified: Secondary | ICD-10-CM | POA: Diagnosis not present

## 2023-03-01 DIAGNOSIS — Z85820 Personal history of malignant melanoma of skin: Secondary | ICD-10-CM | POA: Diagnosis not present

## 2023-03-01 DIAGNOSIS — I10 Essential (primary) hypertension: Secondary | ICD-10-CM | POA: Diagnosis not present

## 2023-03-01 DIAGNOSIS — K589 Irritable bowel syndrome without diarrhea: Secondary | ICD-10-CM | POA: Diagnosis not present

## 2023-03-01 DIAGNOSIS — G20C Parkinsonism, unspecified: Secondary | ICD-10-CM | POA: Diagnosis not present

## 2023-03-01 DIAGNOSIS — N1832 Chronic kidney disease, stage 3b: Secondary | ICD-10-CM | POA: Diagnosis not present

## 2023-03-02 DIAGNOSIS — Z85820 Personal history of malignant melanoma of skin: Secondary | ICD-10-CM | POA: Diagnosis not present

## 2023-03-02 DIAGNOSIS — K589 Irritable bowel syndrome without diarrhea: Secondary | ICD-10-CM | POA: Diagnosis not present

## 2023-03-02 DIAGNOSIS — G20C Parkinsonism, unspecified: Secondary | ICD-10-CM | POA: Diagnosis not present

## 2023-03-02 DIAGNOSIS — I10 Essential (primary) hypertension: Secondary | ICD-10-CM | POA: Diagnosis not present

## 2023-03-02 DIAGNOSIS — N1832 Chronic kidney disease, stage 3b: Secondary | ICD-10-CM | POA: Diagnosis not present

## 2023-03-02 DIAGNOSIS — H353190 Nonexudative age-related macular degeneration, unspecified eye, stage unspecified: Secondary | ICD-10-CM | POA: Diagnosis not present

## 2023-03-03 DIAGNOSIS — K589 Irritable bowel syndrome without diarrhea: Secondary | ICD-10-CM | POA: Diagnosis not present

## 2023-03-03 DIAGNOSIS — I10 Essential (primary) hypertension: Secondary | ICD-10-CM | POA: Diagnosis not present

## 2023-03-03 DIAGNOSIS — N1832 Chronic kidney disease, stage 3b: Secondary | ICD-10-CM | POA: Diagnosis not present

## 2023-03-03 DIAGNOSIS — Z85820 Personal history of malignant melanoma of skin: Secondary | ICD-10-CM | POA: Diagnosis not present

## 2023-03-03 DIAGNOSIS — H353190 Nonexudative age-related macular degeneration, unspecified eye, stage unspecified: Secondary | ICD-10-CM | POA: Diagnosis not present

## 2023-03-03 DIAGNOSIS — G20C Parkinsonism, unspecified: Secondary | ICD-10-CM | POA: Diagnosis not present

## 2023-03-04 DIAGNOSIS — H353190 Nonexudative age-related macular degeneration, unspecified eye, stage unspecified: Secondary | ICD-10-CM | POA: Diagnosis not present

## 2023-03-04 DIAGNOSIS — G20C Parkinsonism, unspecified: Secondary | ICD-10-CM | POA: Diagnosis not present

## 2023-03-04 DIAGNOSIS — I10 Essential (primary) hypertension: Secondary | ICD-10-CM | POA: Diagnosis not present

## 2023-03-04 DIAGNOSIS — K589 Irritable bowel syndrome without diarrhea: Secondary | ICD-10-CM | POA: Diagnosis not present

## 2023-03-04 DIAGNOSIS — Z85820 Personal history of malignant melanoma of skin: Secondary | ICD-10-CM | POA: Diagnosis not present

## 2023-03-04 DIAGNOSIS — N1832 Chronic kidney disease, stage 3b: Secondary | ICD-10-CM | POA: Diagnosis not present

## 2023-03-05 DIAGNOSIS — K589 Irritable bowel syndrome without diarrhea: Secondary | ICD-10-CM | POA: Diagnosis not present

## 2023-03-05 DIAGNOSIS — I10 Essential (primary) hypertension: Secondary | ICD-10-CM | POA: Diagnosis not present

## 2023-03-05 DIAGNOSIS — Z85820 Personal history of malignant melanoma of skin: Secondary | ICD-10-CM | POA: Diagnosis not present

## 2023-03-05 DIAGNOSIS — G20C Parkinsonism, unspecified: Secondary | ICD-10-CM | POA: Diagnosis not present

## 2023-03-05 DIAGNOSIS — N1832 Chronic kidney disease, stage 3b: Secondary | ICD-10-CM | POA: Diagnosis not present

## 2023-03-05 DIAGNOSIS — H353190 Nonexudative age-related macular degeneration, unspecified eye, stage unspecified: Secondary | ICD-10-CM | POA: Diagnosis not present

## 2023-03-06 DIAGNOSIS — N1832 Chronic kidney disease, stage 3b: Secondary | ICD-10-CM | POA: Diagnosis not present

## 2023-03-06 DIAGNOSIS — Z85820 Personal history of malignant melanoma of skin: Secondary | ICD-10-CM | POA: Diagnosis not present

## 2023-03-06 DIAGNOSIS — I10 Essential (primary) hypertension: Secondary | ICD-10-CM | POA: Diagnosis not present

## 2023-03-06 DIAGNOSIS — G20C Parkinsonism, unspecified: Secondary | ICD-10-CM | POA: Diagnosis not present

## 2023-03-06 DIAGNOSIS — H353190 Nonexudative age-related macular degeneration, unspecified eye, stage unspecified: Secondary | ICD-10-CM | POA: Diagnosis not present

## 2023-03-06 DIAGNOSIS — K589 Irritable bowel syndrome without diarrhea: Secondary | ICD-10-CM | POA: Diagnosis not present

## 2023-03-07 DIAGNOSIS — I10 Essential (primary) hypertension: Secondary | ICD-10-CM | POA: Diagnosis not present

## 2023-03-07 DIAGNOSIS — K589 Irritable bowel syndrome without diarrhea: Secondary | ICD-10-CM | POA: Diagnosis not present

## 2023-03-07 DIAGNOSIS — Z85820 Personal history of malignant melanoma of skin: Secondary | ICD-10-CM | POA: Diagnosis not present

## 2023-03-07 DIAGNOSIS — N1832 Chronic kidney disease, stage 3b: Secondary | ICD-10-CM | POA: Diagnosis not present

## 2023-03-07 DIAGNOSIS — G20C Parkinsonism, unspecified: Secondary | ICD-10-CM | POA: Diagnosis not present

## 2023-03-07 DIAGNOSIS — H353190 Nonexudative age-related macular degeneration, unspecified eye, stage unspecified: Secondary | ICD-10-CM | POA: Diagnosis not present

## 2023-03-08 DIAGNOSIS — I10 Essential (primary) hypertension: Secondary | ICD-10-CM | POA: Diagnosis not present

## 2023-03-08 DIAGNOSIS — H353190 Nonexudative age-related macular degeneration, unspecified eye, stage unspecified: Secondary | ICD-10-CM | POA: Diagnosis not present

## 2023-03-08 DIAGNOSIS — K589 Irritable bowel syndrome without diarrhea: Secondary | ICD-10-CM | POA: Diagnosis not present

## 2023-03-08 DIAGNOSIS — N1832 Chronic kidney disease, stage 3b: Secondary | ICD-10-CM | POA: Diagnosis not present

## 2023-03-08 DIAGNOSIS — G20C Parkinsonism, unspecified: Secondary | ICD-10-CM | POA: Diagnosis not present

## 2023-03-08 DIAGNOSIS — Z85820 Personal history of malignant melanoma of skin: Secondary | ICD-10-CM | POA: Diagnosis not present

## 2023-03-23 DEATH — deceased
